# Patient Record
Sex: Female | Born: 1955 | Race: White | Hispanic: No | Marital: Single | Smoking: Never smoker
Health system: Southern US, Community
[De-identification: ages and names within clinical notes are randomized; demographics above are authoritative.]

## PROBLEM LIST (undated history)

## (undated) DIAGNOSIS — K469 Unspecified abdominal hernia without obstruction or gangrene: Secondary | ICD-10-CM

## (undated) DIAGNOSIS — G629 Polyneuropathy, unspecified: Secondary | ICD-10-CM

## (undated) DIAGNOSIS — I209 Angina pectoris, unspecified: Secondary | ICD-10-CM

## (undated) DIAGNOSIS — F329 Major depressive disorder, single episode, unspecified: Secondary | ICD-10-CM

## (undated) DIAGNOSIS — I509 Heart failure, unspecified: Secondary | ICD-10-CM

## (undated) DIAGNOSIS — M109 Gout, unspecified: Secondary | ICD-10-CM

## (undated) DIAGNOSIS — M797 Fibromyalgia: Secondary | ICD-10-CM

## (undated) DIAGNOSIS — K635 Polyp of colon: Secondary | ICD-10-CM

## (undated) DIAGNOSIS — D649 Anemia, unspecified: Secondary | ICD-10-CM

## (undated) DIAGNOSIS — F32A Depression, unspecified: Secondary | ICD-10-CM

## (undated) DIAGNOSIS — I10 Essential (primary) hypertension: Secondary | ICD-10-CM

## (undated) DIAGNOSIS — K7460 Unspecified cirrhosis of liver: Secondary | ICD-10-CM

## (undated) DIAGNOSIS — E119 Type 2 diabetes mellitus without complications: Secondary | ICD-10-CM

## (undated) DIAGNOSIS — K589 Irritable bowel syndrome without diarrhea: Secondary | ICD-10-CM

## (undated) DIAGNOSIS — M5030 Other cervical disc degeneration, unspecified cervical region: Secondary | ICD-10-CM

## (undated) DIAGNOSIS — E785 Hyperlipidemia, unspecified: Secondary | ICD-10-CM

## (undated) DIAGNOSIS — T7840XA Allergy, unspecified, initial encounter: Secondary | ICD-10-CM

## (undated) HISTORY — DX: Polyneuropathy, unspecified: G62.9

## (undated) HISTORY — DX: Fibromyalgia: M79.7

## (undated) HISTORY — DX: Unspecified cirrhosis of liver: K74.60

## (undated) HISTORY — PX: ESOPHAGOGASTRODUODENOSCOPY: SHX1529

## (undated) HISTORY — DX: Depression, unspecified: F32.A

## (undated) HISTORY — PX: CERVICAL DISC SURGERY: SHX588

## (undated) HISTORY — PX: UMBILICAL HERNIA REPAIR: SHX196

## (undated) HISTORY — DX: Other cervical disc degeneration, unspecified cervical region: M50.30

## (undated) HISTORY — DX: Gout, unspecified: M10.9

## (undated) HISTORY — PX: OTHER SURGICAL HISTORY: SHX169

## (undated) HISTORY — DX: Allergy, unspecified, initial encounter: T78.40XA

## (undated) HISTORY — DX: Major depressive disorder, single episode, unspecified: F32.9

## (undated) HISTORY — DX: Anemia, unspecified: D64.9

## (undated) HISTORY — PX: KNEE CARTILAGE SURGERY: SHX688

## (undated) HISTORY — DX: Irritable bowel syndrome, unspecified: K58.9

## (undated) HISTORY — PX: SPINE SURGERY: SHX786

---

## 1985-04-01 HISTORY — PX: TONSILLECTOMY: SUR1361

## 2002-05-25 ENCOUNTER — Encounter: Payer: Self-pay | Admitting: Neurosurgery

## 2002-05-31 ENCOUNTER — Encounter: Payer: Self-pay | Admitting: Neurosurgery

## 2002-05-31 ENCOUNTER — Ambulatory Visit (HOSPITAL_COMMUNITY): Admission: RE | Admit: 2002-05-31 | Discharge: 2002-06-01 | Payer: Self-pay | Admitting: Neurosurgery

## 2006-09-05 ENCOUNTER — Ambulatory Visit: Payer: Self-pay | Admitting: *Deleted

## 2006-12-10 ENCOUNTER — Ambulatory Visit: Payer: Self-pay | Admitting: *Deleted

## 2007-02-03 ENCOUNTER — Ambulatory Visit: Payer: Self-pay | Admitting: Gastroenterology

## 2009-03-01 ENCOUNTER — Ambulatory Visit: Payer: Self-pay | Admitting: Family Medicine

## 2010-03-22 ENCOUNTER — Ambulatory Visit: Payer: Self-pay | Admitting: Internal Medicine

## 2010-03-24 ENCOUNTER — Emergency Department: Payer: Self-pay | Admitting: Emergency Medicine

## 2010-04-10 ENCOUNTER — Emergency Department: Payer: Self-pay | Admitting: Emergency Medicine

## 2011-05-25 ENCOUNTER — Inpatient Hospital Stay: Payer: Self-pay | Admitting: Psychiatry

## 2011-05-25 LAB — COMPREHENSIVE METABOLIC PANEL
Alkaline Phosphatase: 155 U/L — ABNORMAL HIGH (ref 50–136)
Anion Gap: 8 (ref 7–16)
BUN: 13 mg/dL (ref 7–18)
Calcium, Total: 9.4 mg/dL (ref 8.5–10.1)
Chloride: 95 mmol/L — ABNORMAL LOW (ref 98–107)
Co2: 30 mmol/L (ref 21–32)
EGFR (African American): >60
EGFR (Non-African Amer.): >60
Osmolality: 268 (ref 275–301)
Potassium: 4 mmol/L (ref 3.5–5.1)
SGOT(AST): 22 U/L (ref 15–37)
Sodium: 133 mmol/L — ABNORMAL LOW (ref 136–145)

## 2011-05-25 LAB — ETHANOL
Ethanol %: <0.003 % (ref 0.000–0.080)
Ethanol: <3 mg/dL

## 2011-05-25 LAB — DRUG SCREEN, URINE
Amphetamines, Ur Screen: NEGATIVE (ref ?–1000)
Benzodiazepine, Ur Scrn: NEGATIVE (ref ?–200)
Cannabinoid 50 Ng, Ur ~~LOC~~: NEGATIVE (ref ?–50)
MDMA (Ecstasy)Ur Screen: NEGATIVE (ref ?–500)
Methadone, Ur Screen: NEGATIVE (ref ?–300)
Phencyclidine (PCP) Ur S: NEGATIVE (ref ?–25)

## 2011-05-25 LAB — CBC
HCT: 34.5 % — ABNORMAL LOW (ref 35.0–47.0)
MCH: 31.3 pg (ref 26.0–34.0)
MCHC: 33.1 g/dL (ref 32.0–36.0)
Platelet: 177 10*3/uL (ref 150–440)
RDW: 13.4 % (ref 11.5–14.5)
WBC: 8.1 10*3/uL (ref 3.6–11.0)

## 2011-05-25 LAB — T4, FREE: Free Thyroxine: 1.21 ng/dL (ref 0.76–1.46)

## 2011-05-25 LAB — ACETAMINOPHEN LEVEL: Acetaminophen: <2 ug/mL

## 2011-05-26 LAB — BEHAVIORAL MEDICINE 1 PANEL
BUN: 10 mg/dL (ref 7–18)
Basophil #: 0 10*3/uL (ref 0.0–0.1)
Basophil %: 0.7 %
Bilirubin,Total: 0.7 mg/dL (ref 0.2–1.0)
Calcium, Total: 9.1 mg/dL (ref 8.5–10.1)
Co2: 29 mmol/L (ref 21–32)
EGFR (African American): >60
EGFR (Non-African Amer.): >60
Eosinophil #: 0.1 10*3/uL (ref 0.0–0.7)
Eosinophil %: 2.1 %
Glucose: 105 mg/dL — ABNORMAL HIGH (ref 65–99)
Lymphocyte #: 1.5 10*3/uL (ref 1.0–3.6)
MCH: 31.7 pg (ref 26.0–34.0)
MCV: 93 fL (ref 80–100)
Monocyte #: 0.5 10*3/uL (ref 0.0–0.7)
Neutrophil %: 66.9 %
Osmolality: 279 (ref 275–301)
Platelet: 165 10*3/uL (ref 150–440)
Potassium: 3.5 mmol/L (ref 3.5–5.1)
RBC: 3.28 10*6/uL — ABNORMAL LOW (ref 3.80–5.20)
RDW: 13.3 % (ref 11.5–14.5)
SGOT(AST): 19 U/L (ref 15–37)
SGPT (ALT): 12 U/L
Thyroid Stimulating Horm: 4.07 u[IU]/mL

## 2011-05-27 DIAGNOSIS — Z79899 Other long term (current) drug therapy: Secondary | ICD-10-CM

## 2011-05-29 LAB — URINALYSIS, COMPLETE
Blood: NEGATIVE
Glucose,UR: NEGATIVE mg/dL (ref 0–75)
Nitrite: POSITIVE
Protein: NEGATIVE
RBC,UR: 1 /HPF (ref 0–5)
Squamous Epithelial: 1

## 2011-06-22 LAB — CBC
HCT: 30.5 % — ABNORMAL LOW (ref 35.0–47.0)
HGB: 10.4 g/dL — ABNORMAL LOW (ref 12.0–16.0)
MCV: 93 fL (ref 80–100)
Platelet: 162 10*3/uL (ref 150–440)
RBC: 3.27 10*6/uL — ABNORMAL LOW (ref 3.80–5.20)

## 2011-06-22 LAB — COMPREHENSIVE METABOLIC PANEL
Albumin: 3.9 g/dL (ref 3.4–5.0)
Alkaline Phosphatase: 133 U/L (ref 50–136)
Anion Gap: 16 (ref 7–16)
BUN: 8 mg/dL (ref 7–18)
Bilirubin,Total: 0.7 mg/dL (ref 0.2–1.0)
Calcium, Total: 8.7 mg/dL (ref 8.5–10.1)
Creatinine: 0.87 mg/dL (ref 0.60–1.30)
Glucose: 132 mg/dL — ABNORMAL HIGH (ref 65–99)
Osmolality: 272 (ref 275–301)
Potassium: 3 mmol/L — ABNORMAL LOW (ref 3.5–5.1)
SGPT (ALT): 19 U/L
Sodium: 136 mmol/L (ref 136–145)
Total Protein: 7.7 g/dL (ref 6.4–8.2)

## 2011-06-22 LAB — TSH: Thyroid Stimulating Horm: 4.29 u[IU]/mL

## 2011-06-22 LAB — URINALYSIS, COMPLETE
Bilirubin,UR: NEGATIVE
Nitrite: POSITIVE
Ph: 6 (ref 4.5–8.0)
Protein: NEGATIVE
RBC,UR: NONE SEEN /HPF (ref 0–5)
Squamous Epithelial: 31
WBC UR: 14 /HPF (ref 0–5)

## 2011-06-22 LAB — DRUG SCREEN, URINE
Amphetamines, Ur Screen: NEGATIVE (ref ?–1000)
Cannabinoid 50 Ng, Ur ~~LOC~~: NEGATIVE (ref ?–50)
Cocaine Metabolite,Ur ~~LOC~~: NEGATIVE (ref ?–300)
MDMA (Ecstasy)Ur Screen: NEGATIVE (ref ?–500)
Methadone, Ur Screen: NEGATIVE (ref ?–300)
Opiate, Ur Screen: NEGATIVE (ref ?–300)
Phencyclidine (PCP) Ur S: NEGATIVE (ref ?–25)
Tricyclic, Ur Screen: POSITIVE (ref ?–1000)

## 2011-06-22 LAB — ACETAMINOPHEN LEVEL: Acetaminophen: <2 ug/mL

## 2011-06-23 ENCOUNTER — Inpatient Hospital Stay: Payer: Self-pay | Admitting: Psychiatry

## 2013-03-10 ENCOUNTER — Ambulatory Visit: Payer: Self-pay | Admitting: Family Medicine

## 2013-03-17 ENCOUNTER — Encounter: Payer: Self-pay | Admitting: Podiatry

## 2013-03-17 ENCOUNTER — Ambulatory Visit (INDEPENDENT_AMBULATORY_CARE_PROVIDER_SITE_OTHER): Payer: Medicare Other | Admitting: Podiatry

## 2013-03-17 ENCOUNTER — Ambulatory Visit (INDEPENDENT_AMBULATORY_CARE_PROVIDER_SITE_OTHER): Payer: Medicare Other

## 2013-03-17 ENCOUNTER — Ambulatory Visit: Payer: Self-pay | Admitting: Podiatry

## 2013-03-17 VITALS — BP 110/60 | HR 79 | Resp 22 | Ht 65.0 in | Wt 194.0 lb

## 2013-03-17 DIAGNOSIS — L97509 Non-pressure chronic ulcer of other part of unspecified foot with unspecified severity: Secondary | ICD-10-CM

## 2013-03-17 DIAGNOSIS — M79609 Pain in unspecified limb: Secondary | ICD-10-CM

## 2013-03-17 DIAGNOSIS — M79672 Pain in left foot: Secondary | ICD-10-CM

## 2013-03-17 DIAGNOSIS — E1169 Type 2 diabetes mellitus with other specified complication: Secondary | ICD-10-CM

## 2013-03-17 DIAGNOSIS — L97512 Non-pressure chronic ulcer of other part of right foot with fat layer exposed: Secondary | ICD-10-CM

## 2013-03-17 MED ORDER — MUPIROCIN 2 % EX OINT: 1.0000 "application " | TOPICAL_OINTMENT | Freq: Two times a day (BID) | CUTANEOUS | Status: DC

## 2013-03-17 MED ORDER — CLINDAMYCIN HCL 150 MG PO CAPS: 150.0000 mg | ORAL_CAPSULE | Freq: Two times a day (BID) | ORAL | Status: DC

## 2013-03-17 MED ORDER — AMOXICILLIN-POT CLAVULANATE 875-125 MG PO TABS: 1.0000 | ORAL_TABLET | Freq: Two times a day (BID) | ORAL | Status: DC

## 2013-03-17 NOTE — Patient Instructions (Signed)
Epsom salt soaks twice daily, 1 cup epsom salt to a quart of warm water

## 2013-03-17 NOTE — Progress Notes (Signed)
   Subjective:    Patient ID: [name], female    DOB: 1956-02-23, 57 y.o.   MRN: [id number]  HPI Comments: I HAVE AN INFECTION IN MY RIGHT 3RD TOE AND ITS NOT GETTING ANY BETTER   N RED SWOLLEN , ULCER , DIABETIC 10 YEARS  L RIGHT # 3 TOE D 1 MONTH  O THINK CUTS IT AND LOSS TOENAIL C WORSE  A  T [facility] , ANTIBIOTICS   Diabetes Associated symptoms include fatigue.      Review of Systems  Constitutional: Positive for appetite change and fatigue.       WEIGHT CHANGE   Gastrointestinal:       ABDOMINAL PAIN  NAUSEA AND VOMITING  Endocrine:       DIABETIC TYPE 2   All other systems reviewed and are negative.       Objective:   Physical Exam: [name] presents today 57 year old white female painful third toe right foot has been treated with Bactrim to no avail. I have reviewed her past medical history medications allergies surgeries social history and review of systems. Vital signs are stable she is alert and oriented x3. Pulses are palpable +2/4 DP and PT bilateral, capillary fill time to digits one through 5 is immediate. Neurologic sensorium is decreased per Semmes-Weinstein monofilament to the level of the toes only. Deep tendon reflexes are intact and brisk bilateral. Orthopedic evaluation Mr. is all joints distal to the ankle have a full range of motion without crepitus mild HAV deformity and hammertoe deformities are noted bilateral. Cutaneous evaluation Mr. is supple while hydrated cutis with erythema and edema to the third digit of the right foot with a distal ulceration to the third toe resulting in a granuloma the does not probe deep to bone. Radiographic evaluation doesn't demonstrate any type of osseous abnormality to the tip of the toe however it does demonstrates severe osteoarthritis about the entire foot.       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  Plan:  Debridement wound thoroughly today placed dry sterile compressive dressing. She was given both oral and written home-going instructions on how to perform this similarly after soaking twice daily in Epsom salts in warm water. She was given a prescription for Augmentin 875 one by mouth twice a day as well as clindamycin 150 one by mouth twice a day she was dispensed a Darco shoe and I will followup with her in 2 weeks

## 2013-03-31 ENCOUNTER — Ambulatory Visit: Payer: Medicare Other | Admitting: Podiatry

## 2013-03-31 VITALS — BP 107/59 | HR 70 | Resp 18

## 2013-03-31 DIAGNOSIS — L97509 Non-pressure chronic ulcer of other part of unspecified foot with unspecified severity: Secondary | ICD-10-CM

## 2013-03-31 DIAGNOSIS — E1169 Type 2 diabetes mellitus with other specified complication: Secondary | ICD-10-CM

## 2013-03-31 NOTE — Progress Notes (Signed)
She presents today for followup of her cellulitis and hammertoe for deformity with ulcer to the third digit of her right foot. She states it seems to be decreasing as she takes her antibiotics and redness and pain. She states that I think is looking better.  Objective: Vital signs are stable she is alert and oriented x3. Pulses are strongly palpable right. Hammertoe deformity is resulting in a distal clavus the broke down and is now ulcerated. At this point mild cellulitis is noted no drainage. As noted. I debrided all reactive hyperkeratosis to bleeding today with no signs of infection prominently.  Assessment: Diabetic ulceration distal aspect third digit right foot care rule out osteomyelitis at this point.  Plan: Discussed etiology pathology conservative versus surgical therapies. She will continue her antibiotic that she does have refilled I debrided the wound thoroughly today redressed it. She will continue the use of her Darco shoe. I also prescribed her a buttress pad to help hold the toe from the floor. I will followup with her a couple weeks at which time x-rays will be taken to a firm no osteomyelitis

## 2013-04-01 HISTORY — PX: CHOLECYSTECTOMY: SHX55

## 2013-04-01 HISTORY — PX: HERNIA REPAIR: SHX51

## 2013-04-01 HISTORY — PX: TOE AMPUTATION: SHX809

## 2013-04-14 ENCOUNTER — Ambulatory Visit: Payer: Medicare Other | Admitting: Podiatry

## 2013-04-21 ENCOUNTER — Encounter: Payer: Self-pay | Admitting: Podiatry

## 2013-04-21 ENCOUNTER — Ambulatory Visit (INDEPENDENT_AMBULATORY_CARE_PROVIDER_SITE_OTHER): Payer: Medicare Other | Admitting: Podiatry

## 2013-04-21 VITALS — BP 109/63 | HR 71 | Resp 18

## 2013-04-21 DIAGNOSIS — M79609 Pain in unspecified limb: Secondary | ICD-10-CM

## 2013-04-21 DIAGNOSIS — B351 Tinea unguium: Secondary | ICD-10-CM

## 2013-04-21 DIAGNOSIS — L97509 Non-pressure chronic ulcer of other part of unspecified foot with unspecified severity: Secondary | ICD-10-CM

## 2013-04-21 NOTE — Progress Notes (Signed)
Ulcer check 3rd toe right foot, pt states that it is healing and would like her toenails trimmed that they are getting to long and painful. She continues to soak the toe in dressing daily. She states that she thinks is getting much better.  Objective: Vital signs are stable she is alert and oriented x3 pulses are palpable bilateral lower extremity. Nails are thick yellow dystrophic sharply incurvated and painful on palpation as well as debridement. Third digit of the right foot demonstrates hammertoe deformity with overlying erythema and edema with the distal ulceration.  Assessment: Ulceration third digit right foot. Pain in limb secondary to onychomycosis.  Plan: Debridement of nails 1 through 5 bilateral covered service second of pain and diabetes.

## 2013-04-23 ENCOUNTER — Ambulatory Visit: Payer: Self-pay | Admitting: Surgery

## 2013-05-11 ENCOUNTER — Ambulatory Visit: Payer: Self-pay | Admitting: Gastroenterology

## 2013-05-19 ENCOUNTER — Ambulatory Visit: Payer: Medicare Other | Admitting: Podiatry

## 2013-05-20 ENCOUNTER — Ambulatory Visit: Payer: Self-pay | Admitting: Gastroenterology

## 2013-05-20 LAB — LACTATE DEHYDROGENASE, PLEURAL OR PERITONEAL FLUID: LDH, BODY FLUID: 90 U/L

## 2013-05-20 LAB — AMYLASE, BODY FLUID: AMYLASE, BODY FLUID: 15 U/L

## 2013-05-20 LAB — BODY FLUID CELL COUNT WITH DIFFERENTIAL
Basophil: 0 %
Eosinophil: 0 %
LYMPHS PCT: 62 %
Neutrophils: 5 %
Nucleated Cell Count: 185 /mm3
OTHER MONONUCLEAR CELLS: 33 %
Other Cells BF: 0 %

## 2013-05-20 LAB — PROTEIN, BODY FLUID: Protein, Body Fluid: 3.5 g/dL

## 2013-05-20 LAB — GLUCOSE, SEROUS FLUID: GLUCOSE, BODY FLUID: 72 mg/dL

## 2013-05-21 LAB — ALBUMIN, FLUID (OTHER): Body Fluid Albumin: 2 g/dL

## 2013-05-24 LAB — BODY FLUID CULTURE

## 2013-05-26 ENCOUNTER — Ambulatory Visit: Payer: Medicare Other | Admitting: Podiatry

## 2013-05-26 ENCOUNTER — Emergency Department: Payer: Self-pay | Admitting: Emergency Medicine

## 2013-05-26 LAB — CBC WITH DIFFERENTIAL/PLATELET
BASOS ABS: 0 10*3/uL (ref 0.0–0.1)
Basophil %: 0.4 %
EOS ABS: 0 10*3/uL (ref 0.0–0.7)
EOS PCT: 0.2 %
HCT: 35 % (ref 35.0–47.0)
HGB: 11.7 g/dL — ABNORMAL LOW (ref 12.0–16.0)
LYMPHS ABS: 0.1 10*3/uL — AB (ref 1.0–3.6)
LYMPHS PCT: 2.2 %
MCH: 31.5 pg (ref 26.0–34.0)
MCHC: 33.5 g/dL (ref 32.0–36.0)
MCV: 94 fL (ref 80–100)
MONOS PCT: 2.8 %
Monocyte #: 0.2 x10 3/mm (ref 0.2–0.9)
NEUTROS ABS: 5.6 10*3/uL (ref 1.4–6.5)
Neutrophil %: 94.4 %
PLATELETS: 253 10*3/uL (ref 150–440)
RBC: 3.72 10*6/uL — ABNORMAL LOW (ref 3.80–5.20)
RDW: 13.1 % (ref 11.5–14.5)
WBC: 5.9 10*3/uL (ref 3.6–11.0)

## 2013-05-26 LAB — URINALYSIS, COMPLETE
Blood: NEGATIVE
GLUCOSE, UR: NEGATIVE mg/dL (ref 0–75)
Leukocyte Esterase: NEGATIVE
Nitrite: POSITIVE
PH: 5 (ref 4.5–8.0)
RBC,UR: <1 /HPF (ref 0–5)
SPECIFIC GRAVITY: 1.024 (ref 1.003–1.030)
SQUAMOUS EPITHELIAL: NONE SEEN

## 2013-05-26 LAB — COMPREHENSIVE METABOLIC PANEL
Albumin: 3.8 g/dL (ref 3.4–5.0)
Alkaline Phosphatase: 209 U/L — ABNORMAL HIGH
Anion Gap: 4 — ABNORMAL LOW (ref 7–16)
BUN: 4 mg/dL — AB (ref 7–18)
Bilirubin,Total: 0.8 mg/dL (ref 0.2–1.0)
CALCIUM: 9.1 mg/dL (ref 8.5–10.1)
CO2: 28 mmol/L (ref 21–32)
CREATININE: 0.6 mg/dL (ref 0.60–1.30)
Chloride: 105 mmol/L (ref 98–107)
EGFR (Non-African Amer.): >60
Glucose: 108 mg/dL — ABNORMAL HIGH (ref 65–99)
Osmolality: 271 (ref 275–301)
POTASSIUM: 3.6 mmol/L (ref 3.5–5.1)
SGOT(AST): 28 U/L (ref 15–37)
SGPT (ALT): 18 U/L (ref 12–78)
Sodium: 137 mmol/L (ref 136–145)
Total Protein: 7.8 g/dL (ref 6.4–8.2)

## 2013-05-26 LAB — AMMONIA: Ammonia, Plasma: 47 mcmol/L — ABNORMAL HIGH (ref 11–32)

## 2013-05-26 LAB — TROPONIN I

## 2013-06-07 ENCOUNTER — Ambulatory Visit: Payer: Medicare Other | Admitting: Podiatry

## 2013-06-10 ENCOUNTER — Ambulatory Visit: Payer: Self-pay | Admitting: Gastroenterology

## 2013-06-10 HISTORY — PX: COLONOSCOPY W/ BIOPSIES AND POLYPECTOMY: SHX1376

## 2013-06-21 ENCOUNTER — Encounter: Payer: Self-pay | Admitting: Podiatry

## 2013-06-21 ENCOUNTER — Ambulatory Visit (INDEPENDENT_AMBULATORY_CARE_PROVIDER_SITE_OTHER): Payer: Medicare Other | Admitting: Podiatry

## 2013-06-21 VITALS — BP 105/63 | HR 68 | Resp 16

## 2013-06-21 DIAGNOSIS — B351 Tinea unguium: Secondary | ICD-10-CM

## 2013-06-21 DIAGNOSIS — M79609 Pain in unspecified limb: Secondary | ICD-10-CM

## 2013-06-21 DIAGNOSIS — M86679 Other chronic osteomyelitis, unspecified ankle and foot: Secondary | ICD-10-CM

## 2013-06-21 DIAGNOSIS — E119 Type 2 diabetes mellitus without complications: Secondary | ICD-10-CM

## 2013-06-21 NOTE — Progress Notes (Signed)
She presents today stating that her third toe of her right foot looks worse than her she says I am unable to take care of it on a regular basis. She denies fever chills nausea and vomiting. The toe is becoming more painful. Objective: Vital signs are stable she is alert and oriented x3 pulses are palpable right lower extremity third digit of the right foot demonstrates erythema and edema with abscess and chronic ulceration that is probable to the plantar distal aspect of the third toe right foot.  Assessment: Osteomyelitis third digit right foot.  Plan: We went over the consent form today line bylined number by number giving her ample time to ask questions she saw fit regarding a distal disarticulation with resection of a portion of the toe and osteomyelitis. Answered all the questions regarding the procedure the best [name] terms she understands that is amenable to it. He signed all 3 pages of the consent form and I will followup with her in the near future for surgery we are asking for 2 clearances from her primary doctors and we are awaiting does before scheduling this case.

## 2013-07-12 ENCOUNTER — Telehealth: Payer: Self-pay | Admitting: *Deleted

## 2013-07-12 NOTE — Telephone Encounter (Signed)
Tried to call patient to tell her that we got the clearance from her doctors. We need to go ahead and get her a date for surgery. Could not leave message no answering machine

## 2013-07-19 ENCOUNTER — Ambulatory Visit: Payer: Medicare Other | Admitting: Podiatry

## 2013-07-21 ENCOUNTER — Ambulatory Visit (INDEPENDENT_AMBULATORY_CARE_PROVIDER_SITE_OTHER): Payer: Medicare Other | Admitting: Podiatry

## 2013-07-21 VITALS — Resp 16 | Ht 65.0 in | Wt 190.0 lb

## 2013-07-21 DIAGNOSIS — E119 Type 2 diabetes mellitus without complications: Secondary | ICD-10-CM

## 2013-07-21 DIAGNOSIS — M86679 Other chronic osteomyelitis, unspecified ankle and foot: Secondary | ICD-10-CM

## 2013-07-21 MED ORDER — OXYCODONE-ACETAMINOPHEN 10-325 MG PO TABS: ORAL_TABLET | ORAL | Status: DC

## 2013-07-21 MED ORDER — PROMETHAZINE HCL 12.5 MG PO TABS: ORAL_TABLET | ORAL | Status: DC

## 2013-07-21 MED ORDER — AMOXICILLIN-POT CLAVULANATE 875-125 MG PO TABS: 1.0000 | ORAL_TABLET | Freq: Two times a day (BID) | ORAL | Status: DC

## 2013-07-21 NOTE — Progress Notes (Signed)
She presents today for routine nail debridement and also concerned about her osteomyelitis to her third digit of her right foot. We have just recently received her clearance from her doctors so we can schedule her amputation third digit right foot.  Objective: Pulses are palpable bilateral. Cellulitis third digit of the right foot with open ulceration probable osteomyelitis. Nails are thick yellow dystrophic clinically mycotic and painful elongated. Hammertoe deformities are noted bilateral.  Assessment: Pain in limb secondary to onychomycosis. Osteomyelitis third digit right foot. Cellulitis third digit right foot.  Plan: Scheduled her for amputation third digit of the right foot. She was prescribed Augmentin 875 mg as well as her postop pain medication and antibiotics. I will followup with her in 3 days for amputation.

## 2013-07-22 ENCOUNTER — Telehealth: Payer: Self-pay | Admitting: Podiatry

## 2013-07-22 NOTE — Telephone Encounter (Signed)
PT CONCERNED THAT SOME OF THE MEDS SHE IS ON IS ON THE SURGERY NO NO LIST. PT WOULD LIKE TO TALK TO YOU BOTH 3PM.

## 2013-07-22 NOTE — Telephone Encounter (Signed)
Tried calling pt. Received no answer.

## 2013-07-23 ENCOUNTER — Encounter: Payer: Self-pay | Admitting: Podiatry

## 2013-07-23 DIAGNOSIS — M86679 Other chronic osteomyelitis, unspecified ankle and foot: Secondary | ICD-10-CM

## 2013-07-27 ENCOUNTER — Telehealth: Payer: Self-pay | Admitting: *Deleted

## 2013-07-27 NOTE — Progress Notes (Signed)
Amputation (partial/whole) 3rd toe right foot

## 2013-07-27 NOTE — Telephone Encounter (Signed)
Called to ask pt how she is doing from surgery 4.24.15 and received no answer.

## 2013-07-29 ENCOUNTER — Encounter: Payer: Medicare Other | Admitting: Podiatry

## 2013-07-30 ENCOUNTER — Encounter: Payer: Medicare Other | Admitting: Podiatry

## 2013-08-02 ENCOUNTER — Ambulatory Visit (INDEPENDENT_AMBULATORY_CARE_PROVIDER_SITE_OTHER): Payer: Medicare Other | Admitting: Podiatry

## 2013-08-02 ENCOUNTER — Encounter: Payer: Self-pay | Admitting: Podiatry

## 2013-08-02 ENCOUNTER — Ambulatory Visit: Payer: Medicare Other

## 2013-08-02 ENCOUNTER — Ambulatory Visit (INDEPENDENT_AMBULATORY_CARE_PROVIDER_SITE_OTHER): Payer: Medicare Other

## 2013-08-02 VITALS — BP 112/64 | HR 70 | Temp 96.7°F | Resp 16

## 2013-08-02 DIAGNOSIS — Z9889 Other specified postprocedural states: Secondary | ICD-10-CM

## 2013-08-02 NOTE — Progress Notes (Signed)
She presents today for her first postop visit. She denies fever chills nausea vomiting muscle aches and pains. Date of surgery was April 24.  Objective: Vital signs are stable she is alert and oriented x3. Dry sterile dressing was intact to the right foot was removed reveals minimal edema no erythema cellulitis drainage or odor. Disarticulation of the third digit right foot at the PIPJ demonstrates a well healing surgical toe. Appears to be intact without complications. Radiographs confirm complete disarticulation with no signs of bone infection.  Assessment: Well-healing surgical toe third right.  Plan: Redressed today dressed a compressive dressing followup with her in one week for sutures out

## 2013-08-09 ENCOUNTER — Encounter: Payer: Self-pay | Admitting: Podiatry

## 2013-08-09 ENCOUNTER — Ambulatory Visit (INDEPENDENT_AMBULATORY_CARE_PROVIDER_SITE_OTHER): Payer: Medicare Other | Admitting: Podiatry

## 2013-08-09 VITALS — BP 103/62 | HR 70 | Resp 16

## 2013-08-09 DIAGNOSIS — E119 Type 2 diabetes mellitus without complications: Secondary | ICD-10-CM

## 2013-08-09 DIAGNOSIS — Z9889 Other specified postprocedural states: Secondary | ICD-10-CM

## 2013-08-09 NOTE — Progress Notes (Signed)
She presents today for a followup of amputation third digit of the right foot. She denies fever chills nausea vomits weeks and pains. All the sutures were removed from the toe today. Be healing quite nicely. Steri-Strips were placed.  Assessment well-healing surgical toe third right.  Plan: Will allow her to start getting this wet in the shower then she is to soak in Epsom salts in warm water. I will followup with her in one week.

## 2013-08-16 ENCOUNTER — Ambulatory Visit: Payer: Medicare Other | Admitting: Podiatry

## 2013-08-16 VITALS — BP 110/62 | HR 77 | Resp 16

## 2013-08-16 DIAGNOSIS — M2040 Other hammer toe(s) (acquired), unspecified foot: Secondary | ICD-10-CM

## 2013-08-16 DIAGNOSIS — Z9889 Other specified postprocedural states: Secondary | ICD-10-CM

## 2013-08-16 DIAGNOSIS — M79609 Pain in unspecified limb: Secondary | ICD-10-CM

## 2013-08-16 DIAGNOSIS — E119 Type 2 diabetes mellitus without complications: Secondary | ICD-10-CM

## 2013-08-16 NOTE — Progress Notes (Signed)
She presents today for followup of dictation third digit right foot secondary to osteomyelitis. This is gone to heal completely she states. I meant concerned about third toe and the left foot.  Objective: Vital signs are stable she is alert and oriented x3. There is no erythema edema cellulitis drainage or odor to surgical toe right. Left foot does demonstrate a distal clavus with a superficial ulceration which is debrided today. We dispensed crest pads and will followup with her in approximately one month for routine nail debridement.  Assessment: Well-healing surgical foot right. Hammertoe deformity with distal clavus third digit left.  Plan: Debridement of all reactive hyperkeratosis dispensed crest pads and I will followup with her in one month

## 2013-08-24 ENCOUNTER — Encounter: Payer: Self-pay | Admitting: Podiatry

## 2013-09-02 ENCOUNTER — Ambulatory Visit: Payer: Self-pay | Admitting: Gastroenterology

## 2013-09-06 ENCOUNTER — Encounter: Payer: Self-pay | Admitting: *Deleted

## 2013-09-09 ENCOUNTER — Ambulatory Visit: Payer: Self-pay | Admitting: Surgery

## 2013-09-09 LAB — APTT: ACTIVATED PTT: 33.2 s (ref 23.6–35.9)

## 2013-09-09 LAB — PROTIME-INR
INR: 1.2
PROTHROMBIN TIME: 15.2 s — AB (ref 11.5–14.7)

## 2013-09-20 ENCOUNTER — Ambulatory Visit: Payer: Medicare Other | Admitting: General Surgery

## 2013-09-21 ENCOUNTER — Ambulatory Visit: Payer: Self-pay | Admitting: Surgery

## 2013-09-21 LAB — PROTIME-INR
INR: 1.2
PROTHROMBIN TIME: 14.7 s (ref 11.5–14.7)

## 2013-09-22 LAB — PATHOLOGY REPORT

## 2013-10-05 ENCOUNTER — Encounter: Payer: Self-pay | Admitting: General Surgery

## 2013-10-05 ENCOUNTER — Ambulatory Visit: Payer: Medicare Other | Admitting: General Surgery

## 2013-10-05 VITALS — BP 132/78 | HR 72 | Resp 14 | Ht 65.0 in | Wt 199.0 lb

## 2013-10-05 DIAGNOSIS — T8140XA Infection following a procedure, unspecified, initial encounter: Secondary | ICD-10-CM

## 2013-10-05 NOTE — Progress Notes (Signed)
Patient ID: [name], female   DOB: September 25, 1955, 58 y.o.   MRN: [id number]  Chief Complaint  Patient presents with  . Other    gallbladder surgery    HPI [name] is a 58 y.o. female here today for her post op gallbladder and umbilical hernia repair surgery done on  09/21/13 By. Dr. Tamala Julian. The patient states she went to see Ebony Cargo to follow up for fluid in her stomach and she feels that her incision is infected. The patient denies any pain, nausea, vomiting, fever or chills.   HPI  History reviewed. No pertinent past medical history.  Past Surgical History  Procedure Laterality Date  . Cesarean section  [id number]  . Spine surgery    . Tonsillectomy  1987  . Toe amputation Right 2015    partial amputation 3rd toe  . Hernia repair  2015  . Cholecystectomy  2015    History reviewed. No pertinent family history.  Social History History  Substance Use Topics  . Smoking status: Never Smoker   . Smokeless tobacco: Never Used  . Alcohol Use: No    Allergies  Allergen Reactions  . Vicodin [Hydrocodone-Acetaminophen] Other (See Comments)    Per patient she crawls all over the bed    Current Outpatient Prescriptions  Medication Sig Dispense Refill  . acetaminophen (TYLENOL) 500 MG tablet Take 500 mg by mouth every 6 (six) hours as needed.      Marland Kitchen buPROPion (WELLBUTRIN XL) 300 MG 24 hr tablet Take 300 mg by mouth daily.      . Cholecalciferol (VITAMIN D3) 1000 UNITS CAPS Take by mouth.      . citalopram (CELEXA) 40 MG tablet Take 40 mg by mouth daily.      . furosemide (LASIX) 20 MG tablet       . lactulose (CHRONULAC) 10 GM/15ML solution       . meloxicam (MOBIC) 15 MG tablet Take 15 mg by mouth daily.      . metFORMIN (GLUMETZA) 500 MG (MOD) 24 hr tablet Take 500 mg by mouth 2 (two) times daily with a meal.      . methocarbamol (ROBAXIN) 750 MG tablet Take 750 mg by mouth 3 (three) times daily.      . polyethylene glycol powder (GLYCOLAX/MIRALAX) powder        . pravastatin (PRAVACHOL) 20 MG tablet Take 20 mg by mouth daily.      . risperiDONE (RISPERDAL) 2 MG tablet Take 2 mg by mouth 2 (two) times daily.      Marland Kitchen spironolactone (ALDACTONE) 25 MG tablet       . XIFAXAN 550 MG TABS tablet        No current facility-administered medications for this visit.    Review of Systems Review of Systems  Constitutional: Negative.   Respiratory: Negative.   Cardiovascular: Negative.   Gastrointestinal: Negative.     Blood pressure 132/78, pulse 72, resp. rate 14, height 5' 5"  (1.651 m), weight 199 lb (90.266 kg).  Physical Exam Physical Exam  Constitutional: She is oriented to person, place, and time. She appears well-developed and well-nourished.  Abdominal: Soft. Normal appearance and bowel sounds are normal.  Neurological: She is alert and oriented to person, place, and time.  Skin: Skin is warm and dry.  Abdominal incision near the umbilicus has mild irritation- what looks like a cautery burn on one edge. No sign of infection.   Data Reviewed None  Assessment    No  need for oral antibiotics. Patient advised on keeping the area clean. May use antibiotic ointment if she chooses.     Plan    Follow up as needed.        [name] G 10/06/2013, 6:20 AM

## 2013-10-05 NOTE — Patient Instructions (Addendum)
Patient advised to keep area clean and can use Neosporin in the area with a bandaid. Patient to follow up as needed.

## 2013-10-06 ENCOUNTER — Encounter: Payer: Self-pay | Admitting: General Surgery

## 2013-10-06 ENCOUNTER — Ambulatory Visit: Payer: Self-pay | Admitting: Gastroenterology

## 2013-10-18 ENCOUNTER — Ambulatory Visit (INDEPENDENT_AMBULATORY_CARE_PROVIDER_SITE_OTHER): Payer: Medicare Other

## 2013-10-18 ENCOUNTER — Ambulatory Visit (INDEPENDENT_AMBULATORY_CARE_PROVIDER_SITE_OTHER): Payer: Medicare Other | Admitting: Podiatry

## 2013-10-18 ENCOUNTER — Encounter: Payer: Self-pay | Admitting: Podiatry

## 2013-10-18 DIAGNOSIS — L97509 Non-pressure chronic ulcer of other part of unspecified foot with unspecified severity: Secondary | ICD-10-CM

## 2013-10-18 DIAGNOSIS — L97511 Non-pressure chronic ulcer of other part of right foot limited to breakdown of skin: Secondary | ICD-10-CM

## 2013-10-18 DIAGNOSIS — M79609 Pain in unspecified limb: Secondary | ICD-10-CM

## 2013-10-18 DIAGNOSIS — E119 Type 2 diabetes mellitus without complications: Secondary | ICD-10-CM

## 2013-10-18 DIAGNOSIS — B351 Tinea unguium: Secondary | ICD-10-CM

## 2013-10-18 DIAGNOSIS — M79674 Pain in right toe(s): Secondary | ICD-10-CM

## 2013-10-18 MED ORDER — AMOXICILLIN-POT CLAVULANATE 875-125 MG PO TABS: 1.0000 | ORAL_TABLET | Freq: Two times a day (BID) | ORAL | Status: DC

## 2013-10-18 NOTE — Progress Notes (Signed)
She presents today for a chief complaint of a painful lesion to the plantar aspect second digit of the right foot. We amputated the third digit of the right foot do to a painful ulceration those chronic it would not heal. Similarly her second toe is becoming painful.  Objective: Vital signs are stable she is alert and oriented x3. She has a ulceration with reactive hyperkeratosis and mild purulence, a very small amount, to the distal aspect of the toe. Her nails are thick yellow dystrophic onychomycotic and painful palpation.  Assessment: Diabetic ulceration hammertoe deformity second digit right foot. Pain in limb secondary to onychomycosis 1 through 5 bilateral.  Plan: Debridement of nails 1 through 5 bilateral. Debridement of cellulitic ulceration distal second digit right. Started her on Augmentin 875 mg 1 by mouth twice a day #20 with one refill. She will once again initiate her soaking regimen dressing regimen the use of the Darco shoe and Bactroban ointment. I will followup with her in 2 weeks

## 2013-10-27 ENCOUNTER — Ambulatory Visit: Payer: Medicare Other | Admitting: Podiatry

## 2013-11-01 ENCOUNTER — Ambulatory Visit (INDEPENDENT_AMBULATORY_CARE_PROVIDER_SITE_OTHER): Payer: Medicare Other | Admitting: Podiatry

## 2013-11-01 VITALS — BP 122/64 | HR 74 | Resp 16

## 2013-11-01 DIAGNOSIS — E119 Type 2 diabetes mellitus without complications: Secondary | ICD-10-CM

## 2013-11-01 DIAGNOSIS — L97511 Non-pressure chronic ulcer of other part of right foot limited to breakdown of skin: Secondary | ICD-10-CM

## 2013-11-01 DIAGNOSIS — L97509 Non-pressure chronic ulcer of other part of unspecified foot with unspecified severity: Secondary | ICD-10-CM

## 2013-11-01 MED ORDER — AMOXICILLIN-POT CLAVULANATE 875-125 MG PO TABS: 1.0000 | ORAL_TABLET | Freq: Two times a day (BID) | ORAL | Status: DC

## 2013-11-01 NOTE — Progress Notes (Signed)
She presents today for followup of her hammertoe deformity and ulceration with cellulitis right foot. She denies fever chills nausea vomiting muscle aches and pains. She states she continues to take her antibiotics.  Objective: Vital signs are stable she is alert and oriented x3. The toe is still mildly erythematous the level of the DIPJ but the ulceration once debrided the reactive hyperkeratotic tissue demonstrates minimal ulceration.  Assessment: Slowly resolving cellulitis with ulceration second digit right foot.  Plan: Continue antibiotics and refill dose today for her. She will continue dressing the toe on a regular basis after soaking in Betadine warm water. I will followup with her in 2 weeks.

## 2013-11-15 ENCOUNTER — Ambulatory Visit: Payer: Medicare Other | Admitting: Podiatry

## 2013-11-22 ENCOUNTER — Ambulatory Visit: Payer: Medicare Other | Admitting: Podiatry

## 2013-11-25 ENCOUNTER — Ambulatory Visit (INDEPENDENT_AMBULATORY_CARE_PROVIDER_SITE_OTHER): Payer: Medicare Other | Admitting: Podiatry

## 2013-11-25 ENCOUNTER — Encounter: Payer: Self-pay | Admitting: Podiatry

## 2013-11-25 VITALS — BP 123/66 | HR 78 | Resp 16 | Wt 200.0 lb

## 2013-11-25 DIAGNOSIS — L97509 Non-pressure chronic ulcer of other part of unspecified foot with unspecified severity: Secondary | ICD-10-CM

## 2013-11-25 DIAGNOSIS — L97511 Non-pressure chronic ulcer of other part of right foot limited to breakdown of skin: Secondary | ICD-10-CM

## 2013-11-25 MED ORDER — AMOXICILLIN-POT CLAVULANATE 875-125 MG PO TABS: 1.0000 | ORAL_TABLET | Freq: Two times a day (BID) | ORAL | Status: DC

## 2013-11-25 NOTE — Progress Notes (Signed)
She presents today for followup of her ulceration second digit of the right foot. She states that I thought it was getting better until the skin started to break down the top and she points to the PIPJ second digit right foot.  Objective: Vital signs are stable she is alert and oriented x3. There is mild erythema no edema no cellulitis drainage or odor just superficial skin breakdown overlying the PIPJ of the right foot there is erythema from the distal aspect of the toe to the level of the PIPJ but does not appear to be infectious in nature more of a irritation or a atopic dermatitis.  Assessment: Hammertoe deformity with ulceration distal skin breakdown PIPJ dorsally  Plan: Debridement of all reactive hyperkeratosis today and suggested that we continue the use of conservative therapies and I did start her back on her Augmentin just to cover her.

## 2013-12-07 ENCOUNTER — Other Ambulatory Visit: Payer: Self-pay | Admitting: Gastroenterology

## 2013-12-07 LAB — AMMONIA: AMMONIA, PLASMA: 35 umol/L — AB (ref 11–32)

## 2013-12-09 ENCOUNTER — Encounter: Payer: Self-pay | Admitting: Podiatry

## 2013-12-09 ENCOUNTER — Ambulatory Visit (INDEPENDENT_AMBULATORY_CARE_PROVIDER_SITE_OTHER): Payer: Medicare Other | Admitting: Podiatry

## 2013-12-09 DIAGNOSIS — L97511 Non-pressure chronic ulcer of other part of right foot limited to breakdown of skin: Secondary | ICD-10-CM

## 2013-12-09 DIAGNOSIS — E119 Type 2 diabetes mellitus without complications: Secondary | ICD-10-CM

## 2013-12-09 DIAGNOSIS — L97509 Non-pressure chronic ulcer of other part of unspecified foot with unspecified severity: Secondary | ICD-10-CM

## 2013-12-09 NOTE — Progress Notes (Signed)
She presents today for followup of her distal ulceration second digit of the right foot. She continues to soak daily and dress the toe daily. She continues her antibiotics on regular basis. She denies fever chills nausea vomiting muscle aches and pains and states the toe appears to be doing better.  Objective: Pulses are palpable right foot. Hammertoe deformity second digit right foot decrease in erythema with distal clavus was debrided does demonstrate ulceration the does not probe to bone.  Assessment: Hammertoe deformity with probable osteomyelitis and chronic ulceration at the tip of the toe second right.  Plan: Debridement of wound today redressed with a dry sterile compressive dressing continue all conservative therapies soaking and application of antibiotic ointment she will continue her last dose of antibiotics and I will followup with her when she is complete. We did discuss the possible need for digital amputation. She understands that and is amenable to it if necessary.

## 2013-12-22 ENCOUNTER — Ambulatory Visit: Payer: Medicare Other | Admitting: Podiatry

## 2013-12-23 ENCOUNTER — Ambulatory Visit: Payer: Medicare Other | Admitting: Podiatry

## 2014-01-10 ENCOUNTER — Ambulatory Visit (INDEPENDENT_AMBULATORY_CARE_PROVIDER_SITE_OTHER): Payer: Medicare Other | Admitting: Podiatry

## 2014-01-10 VITALS — BP 111/58 | HR 85 | Resp 16

## 2014-01-10 DIAGNOSIS — M86171 Other acute osteomyelitis, right ankle and foot: Secondary | ICD-10-CM

## 2014-01-10 DIAGNOSIS — L97511 Non-pressure chronic ulcer of other part of right foot limited to breakdown of skin: Secondary | ICD-10-CM

## 2014-01-10 NOTE — Progress Notes (Signed)
She presents today for followup of her ulceration second digit right foot. She denies fever chills nausea vomiting muscle aches and pains states that the toe does hurt some that it has become more red.  Objective: Vital signs are stable she is alert and oriented x3 dry sterile dressing on the second digit of the right foot removed does demonstrate palpable pulses right with ulceration and cellulitis to the level of the PIPJ.  Assessment: Osteomyelitis second digit right foot.  Plan: We considered her today for amputation disarticulation at the PIPJ she understands this and is amenable to it. Signed Dr. pages of the consent form. I will followup with her in the near its each her for surgical intervention.

## 2014-01-12 ENCOUNTER — Other Ambulatory Visit: Payer: Self-pay | Admitting: Podiatry

## 2014-01-12 MED ORDER — PROMETHAZINE HCL 25 MG PO TABS: 25.0000 mg | ORAL_TABLET | Freq: Three times a day (TID) | ORAL | Status: DC | PRN

## 2014-01-12 MED ORDER — OXYCODONE-ACETAMINOPHEN 5-325 MG PO TABS: 1.0000 | ORAL_TABLET | Freq: Four times a day (QID) | ORAL | Status: DC | PRN

## 2014-01-12 MED ORDER — AMOXICILLIN-POT CLAVULANATE 875-125 MG PO TABS: 1.0000 | ORAL_TABLET | Freq: Two times a day (BID) | ORAL | Status: DC

## 2014-01-13 ENCOUNTER — Telehealth: Payer: Self-pay | Admitting: *Deleted

## 2014-01-13 NOTE — Telephone Encounter (Signed)
PT WANTED TO KNOW IF SHE CAN RECEIVE HER RX FOR SURGERY TODAY. TOLD PT SHE WILL GET HER RX TOMORROW AFTER SURGERY. PT STATED "OK".

## 2014-01-14 ENCOUNTER — Encounter: Payer: Self-pay | Admitting: Podiatry

## 2014-01-14 DIAGNOSIS — L97511 Non-pressure chronic ulcer of other part of right foot limited to breakdown of skin: Secondary | ICD-10-CM

## 2014-01-14 DIAGNOSIS — M86171 Other acute osteomyelitis, right ankle and foot: Secondary | ICD-10-CM

## 2014-01-15 ENCOUNTER — Telehealth: Payer: Self-pay

## 2014-01-15 NOTE — Telephone Encounter (Signed)
Left message for patient to call office wit questions or concerns regarding post operative status

## 2014-01-15 NOTE — Progress Notes (Signed)
Dr Milinda Pointer performed a distal amputation 2nd met, right foot

## 2014-01-19 ENCOUNTER — Ambulatory Visit (INDEPENDENT_AMBULATORY_CARE_PROVIDER_SITE_OTHER): Payer: Medicare Other

## 2014-01-19 ENCOUNTER — Ambulatory Visit (INDEPENDENT_AMBULATORY_CARE_PROVIDER_SITE_OTHER): Payer: Medicare Other | Admitting: Podiatry

## 2014-01-19 VITALS — BP 120/67 | HR 75 | Temp 97.9°F | Resp 16

## 2014-01-19 DIAGNOSIS — M86171 Other acute osteomyelitis, right ankle and foot: Secondary | ICD-10-CM

## 2014-01-19 DIAGNOSIS — Z9889 Other specified postprocedural states: Secondary | ICD-10-CM

## 2014-01-19 NOTE — Progress Notes (Signed)
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 She denies fever chills nausea vomiting muscle aches and pains. States that the foot is feeling much better.  Objective: Vital signs are stable she is alert and oriented x3. Dry sterile dressing was removed demonstrates considerable amount of dried blood to the area the sutures are intact and the margins remain well coapted there is mild erythema to the surgical site but does not appear to be cellulitic in nature.  Assessment: Well-healing surgical toes second digit of the right foot.  Plan: Redressed today dressed a compressive dressing continue to keep this elevated as much as possible dry and clean and I will followup with her in one week at which time we'll try to remove some sutures.

## 2014-01-26 ENCOUNTER — Encounter: Payer: Medicare Other | Admitting: Podiatry

## 2014-01-31 ENCOUNTER — Encounter: Payer: Medicare Other | Admitting: Podiatry

## 2014-01-31 ENCOUNTER — Encounter: Payer: Self-pay | Admitting: Podiatry

## 2014-02-03 ENCOUNTER — Ambulatory Visit (INDEPENDENT_AMBULATORY_CARE_PROVIDER_SITE_OTHER): Payer: Medicare Other | Admitting: Podiatry

## 2014-02-03 VITALS — BP 118/69 | HR 109 | Resp 16

## 2014-02-03 DIAGNOSIS — B351 Tinea unguium: Secondary | ICD-10-CM

## 2014-02-03 DIAGNOSIS — Z9889 Other specified postprocedural states: Secondary | ICD-10-CM

## 2014-02-03 DIAGNOSIS — S90415A Abrasion, left lesser toe(s), initial encounter: Secondary | ICD-10-CM

## 2014-02-03 NOTE — Patient Instructions (Signed)
Continue with antibiotic ointment and a dressing over the incision. You can start to shower on Saturday, but do not soak your foot. When you get out of the shower, dry the area and apply a small amount of antibiotic ointment and a dressing.  On your left foot, apply antibiotic ointment and a band-aid over the areas on your 2nd and 4th toe.   We will be happy to cut your nails for you to help avoid future problems.  Call with any questions/concerns/change in symptoms.

## 2014-02-04 NOTE — Progress Notes (Signed)
Patient ID: [name], female   DOB: 10-Oct-1955, 58 y.o.   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 She states that she is not having any pain and she is no longer taking any antibiotics. She denies any systemic complaints such as fevers, chills, nausea, vomiting. She has been changing the dressing herself daily. No other complaints at this time. No acute changes since last appointment.  Objective: AAO x3, NAD DP/PT pulses palpable bilaterally, CRT less than 3 seconds Decreased protective sensation with Simms Weinstein monofilament Incision overlying the right foot second digit is well coapted without any evidence of dehiscence and sutures intact. Mild peri-incisional erythema however no ascending cellulitis, edema, increased warmth over the area. Upon evaluation on the left foot there are small abrasions overlying the second and fourth digit. The patient states that she try to cut her nails herself and these areas are likely result of cutting the skin when she cut the nails. There is no overlying erythema, edema, increased warmth. Hammertoe contractures bilaterally. Remaining nails elongated, discolored, dystrophic, brittle, discolored. No calf pain with compression, swelling, warmth, erythema.  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 -Sutures are removed and the right second digit without complications. Incision is coapted. Discussed with the patient she does start to get the area wet in 2 days however do not soak the foot. Apply a small amount of antibiotic ointment over the incision followed by a dressing. -On the left foot apply anabolic ointment over the 2 toes followed by a  dressing -Monitor for any clinical signs or symptoms of infection and directed to call the office immediately if any are to occur or go dressing to the emergency room. -Nails debrided x 8 without complications. Discussed with the patient she should not try to cut the nails herself.  -Discussed the importance of daily foot inspection. -Follow-up in 2 weeks or sooner if any problems are to arise. In the meantime call the office with any questions, concerns, change in symptoms.

## 2014-02-16 ENCOUNTER — Ambulatory Visit (INDEPENDENT_AMBULATORY_CARE_PROVIDER_SITE_OTHER): Payer: Medicare Other | Admitting: Podiatry

## 2014-02-16 VITALS — BP 119/39 | HR 78 | Resp 16

## 2014-02-16 DIAGNOSIS — Z9889 Other specified postprocedural states: Secondary | ICD-10-CM

## 2014-02-16 NOTE — Progress Notes (Signed)
She presents today for follow-up of amputation second digit of the right foot. She denies fever chills nausea vomiting muscle aches and pains.  Objective: Status post amputation disarticulation PIPJ second digit of the right foot with minimal edema and no erythema cellulitis drainage or odor.  Assessment: Well-healing surgical toe right foot.  Plan: Follow up with [place] as needed.

## 2014-03-14 ENCOUNTER — Encounter: Payer: Self-pay | Admitting: Podiatry

## 2014-04-20 ENCOUNTER — Ambulatory Visit: Payer: Medicare Other | Admitting: Podiatry

## 2014-04-25 ENCOUNTER — Ambulatory Visit: Payer: Medicare Other

## 2014-05-23 ENCOUNTER — Ambulatory Visit: Payer: Medicare Other

## 2014-05-26 ENCOUNTER — Ambulatory Visit: Payer: Medicare Other | Admitting: Podiatry

## 2014-06-02 ENCOUNTER — Ambulatory Visit: Payer: Medicare Other | Admitting: Podiatry

## 2014-06-07 ENCOUNTER — Ambulatory Visit (INDEPENDENT_AMBULATORY_CARE_PROVIDER_SITE_OTHER): Payer: Medicare Other | Admitting: Podiatry

## 2014-06-07 DIAGNOSIS — S91109A Unspecified open wound of unspecified toe(s) without damage to nail, initial encounter: Secondary | ICD-10-CM | POA: Diagnosis not present

## 2014-06-07 DIAGNOSIS — B351 Tinea unguium: Secondary | ICD-10-CM

## 2014-06-07 DIAGNOSIS — M79676 Pain in unspecified toe(s): Secondary | ICD-10-CM | POA: Diagnosis not present

## 2014-06-08 NOTE — Progress Notes (Signed)
Patient ID: [name], female   DOB: 05/16/1955, 59 y.o.   MRN: [id number]  Subjective: 59 y.o.-year-old female returns the office today for painful, elongated, thickened toenails which she is unable to do herself. She also states she has an wound on her right big toe. She had some skin that was pealing off and she torn the skin off and developed a wound.  Denies any redness or drainage around the nails or along the site of the wound.  Denies any acute changes since last appointment and no new complaints today. Denies any systemic complaints such as fevers, chills, nausea, vomiting.   Objective: AAO 3, NAD DP/PT pulses palpable, CRT less than 3 seconds Protective sensation decreased with Simms Weinstein monofilament, Achilles tendon reflex intact.  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  Nails hypertrophic, dystrophic, elongated, brittle, discolored 8. There is tenderness overlying these nails. There is no surrounding erythema or drainage along the nail sites. On the medial aspect the right hallux there is a fissure present with her granular wound base. The wound is not deep and there is no probing, undermining, tunneling. The wound measures approximately 1.2 cm x 0.2 cm. There is hyperkeratotic tissue surrounding the area. There is no surrounding erythema, ascending cellulitis, fluctuance, crepitus, malodor, drainage/purulence. No other open lesions or pre-ulcer lesions identified bilaterally. There is hallux malleus of bilateral hallux. No other areas of tenderness bilateral lower extremities. No overlying edema, erythema, increased warmth. No pain with calf compression, swelling, warmth, erythema.  Assessment: Patient presents with symptomatic onychomycosis, superficial wound/fissure right medial hallux  Plan: -Treatment options including alternatives, risks, complications were discussed -Nails sharply debrided 8 without  complication/bleeding. -Hyperkeratotic lesion right medial hallux sharply debrided without complications. The wound was debrided to healthy, bleeding, granular wound base. Recommended to continue with antibiotic ointment and a dressing overlying the area daily. Monitor for any signs or symptoms of infection and directed to call the office immediately should any occur or go to the ER. Discussed possible surgical intervention the future to help straighten her digits to help prevent recurrence of the ulcer. -Discussed daily foot inspection. If there are any changes, to call the office immediately.  -Follow-up in 2 weeks for wound check of the right hallux or sooner if any problems are to arise. In the meantime, encouraged to call the office with any questions, concerns, changes symptoms. *X-ray right foot next appointment.

## 2014-06-21 ENCOUNTER — Ambulatory Visit: Payer: Medicare Other | Admitting: Podiatry

## 2014-07-23 NOTE — Op Note (Signed)
PATIENT NAME:  [name], [name] MR#:  [id number] DATE OF BIRTH:  06-Nov-1955  DATE OF PROCEDURE:  09/21/2013  PREOPERATIVE DIAGNOSIS: Chronic cholecystitis, cholelithiasis, umbilical hernia, cirrhosis.   POSTOPERATIVE DIAGNOSIS: Chronic cholecystitis, cholelithiasis, umbilical hernia, cirrhosis.   PROCEDURE: Laparoscopic cholecystectomy with umbilical hernia repair.   SURGEON: Loreli Dollar, MD  ANESTHESIA: General.   INDICATIONS: This 59 year old female with a history of nonalcoholic steatohepatitis and cirrhosis has had recent right upper quadrant pains and ultrasound findings of gallstones, also a prominent umbilical hernia. The umbilical hernia presented with a bulge of approximately 4 cm, and repair of the hernia was recommended as a part of the operation.   DESCRIPTION OF PROCEDURE: With the patient on the operating table in the supine position under general anesthesia, the abdomen was prepared with ChloraPrep and draped in a sterile manner. A transversely oriented infraumbilical incision was made some 4 cm in length, carried down through subcutaneous tissues to encounter umbilical hernia sac. The sac was dissected free from surrounding structures and was approximately 5 cm in length and was dissected down to the fascial ring defect. There was an approximately 1.8 cm hard mass within the sac. The sac was opened. This mass appeared that it may be a hematoma. Next, the sac was dissected free from the surrounding fascial ring defect. An opening was made in the sac just above the fascia to insert the Veress needle and the 10 mm cannula. The 0 Surgilon was passed around the sac and tied down on to secure this around the cannula for an airtight seal. The abdominal cavity was insufflated with carbon dioxide. The 0 degree 10 mm laparoscope was inserted. There was obvious cirrhosis of the liver with nodularity and firmness. There was a trace of ascites. Next, another incision was made in the epigastrium  slightly to the right of the midline to introduce an 11 mm cannula. Two incisions were made in the lateral aspect of the right upper quadrant to introduce two 5 mm cannulas. I could see that the liver margin was well below the costal margin, and these incisions were placed in appropriate place according to the anatomy of the liver. The gallbladder was retracted towards the right shoulder. Multiple adhesions were taken down between the gallbladder and the liver using hook and cautery. The porta hepatis was demonstrated. The gallbladder neck was mobilized. There was marked induration in the gallbladder and difficult to demonstrate tissue planes. Circumferential dissection was carried out around the cystic artery and around the gallbladder neck. There was much induration and did not appear to be safe to dissect further down towards the cystic duct. The cystic artery was controlled with Endo Clips and divided. The gallbladder neck was further mobilized and dissected circumferentially. The 11 mm port was exchanged for a 12 mm port and inserted a blue cartridge Endo GIA stapler, which was placed across the gallbladder neck and divided. The staple line appeared to be hemostatic. Next, the gallbladder was dissected free from the liver with use of hook and cautery, again finding induration of the wall of the gallbladder. It was noted that during the course of the dissection, there was some drainage of bile which was clear and was aspirated. A number of small stones came out of the gallbladder and were retrieved with the stone scoop. The gallbladder was completely separated from the liver and delivered up through the infraumbilical port and submitted in formalin for routine pathology. The right upper quadrant was further inspected, irrigated and aspirated. Several  small bleeding points in the gallbladder bed were cauterized. Hemostasis subsequently appeared to be intact. The trace of ascites lateral to the liver and in the  pelvis was aspirated. The operative field was further irrigated with saline solution and aspirated. Hemostasis appeared to be intact. Next, the cannulas were removed, allowing carbon dioxide to escape from the peritoneal cavity. After removing the cannula from the hernia sac, the sac was suture ligated with 0 Maxon, and the sac was excised and was sent in formalin with the gallbladder for routine pathology. This did include the 1.8 cm mass which appeared that it may be a hematoma. Next, the fascial ring defect was closed with a transversely oriented suture line of interrupted 0 Maxon figure-of-eight sutures. All skin incisions were closed with 4-0 chromic subcuticular sutures, benzoin and Steri-Strips. The tissues surrounding the incisions were infiltrated with 0.5% Sensorcaine with epinephrine. Dressings were applied with paper tape.   The patient appeared to be in satisfactory condition and was prepared for transfer to the recovery room.   This operation was significantly more difficult than the typical gallbladder case, including the repair of the large umbilical hernia, findings of induration and prolonged operation lasting approximately an hour and 45 minutes.   ____________________________ Lenna Sciara. Rochel Brome, MD jws:lb D: 09/21/2013 09:51:37 ET T: 09/21/2013 10:10:03 ET JOB#: [id number]  cc: Loreli Dollar, MD, <Dictator> Loreli Dollar MD ELECTRONICALLY SIGNED 09/21/2013 18:55

## 2014-07-24 NOTE — H&P (Signed)
PATIENT NAME:  [name], [name] MR#:  [id number] DATE OF BIRTH:  Sep 02, 1955  DATE OF ADMISSION:  06/23/2011  CHIEF COMPLAINT AND IDENTIFYING DATA: Ms. Maloni Musleh is a 59 year old female presenting to the Emergency Department of [facility] on 03/23 with delusions and auditory hallucinations as well as severe anxiety.   HISTORY OF PRESENT ILLNESS: Ms. Botkins was recently discharged from the inpatient behavioral health unit at [facility]. At that time her psychosis had resolved and her depression symptoms had resolved. She was not having any anxiety.   However, within the past week severe feelings on edge, muscle tension, depressed mood, suicidal thoughts, auditory hallucinations, and delusions have returned. She hears her son talking underneath the house even though he lives over an hour away. She also believes that he is crawling in a crawl space under the house.   The patient also was stating that her husband and son were going to burn her house down. However, all of this discussion of people at her house was not consistent with what the police found; they found no one under the house or around the house.   She has been taking her sertraline 100 mg b.i.d. as well as Fanapt.  PAST MEDICAL HISTORY:  1. Hypertension.  2. Diabetes.  3. Chronic pain with fibromyalgia.  PAST PSYCHIATRIC HISTORY: Ms. Breunig does have an extensive psychiatric history.  She has recurrent episodes of major depression with psychosis.  She has no history of suicide attempts. She has been on Celexa in the past along with Wellbutrin.   FAMILY PSYCHIATRIC HISTORY:  Her brother committed suicide by shooting himself at age 6. Her father also had mental illness of an unspecified nature requiring admission to a psychiatric hospital after the death of the patient's brother.   SOCIAL HISTORY: The patient worked at [employer] after high school. She worked three years in [employer] and now she is on disability. She was married for 10 years. She has a 33 year old son and a 1 year old daughter. She has tried alcohol in the past but has no history of social or medical secondary problems. She does not drink recently or currently. She has not used any illegal substances. She has no history of legal charges.   She was born in [place], [place].  She did graduate from high school. No college.   MEDICATIONS UPON ADMISSION:  1. Sertraline 100 mg daily.  2. Fanapt oral daily.   ALLERGIES: No known drug allergies.   LABORATORY DATA: TSH normal. Aspirin negative. Platelet count normal. Hemoglobin mildly decreased at 10.4. SGOT normal. SGPT normal. BUN and creatinine normal. Tylenol negative. Urine drug screen positive for tricyclic compounds.   REVIEW OF SYSTEMS: Constitutional,  HEENT, mouth, neurologic, psychiatric, cardiovascular, respiratory, gastrointestinal, genitourinary, skin, musculoskeletal, hematologic, lymphatic, endocrine, metabolic all unremarkable.   PHYSICAL EXAMINATION:  VITAL SIGNS: Temperature 97.9, pulse 68, respiratory rate 20, blood pressure 111/64.   GENERAL: The patient was examined with a female escort.   HEENT:  Normocephalic, atraumatic. Pupils equally round and reactive to light and accommodation. Oropharynx clear without erythema. Hearing intact bilaterally to finger rub.   EXTREMITIES: No cyanosis, clubbing, or edema.   SKIN: Normal turgor. No rashes.   RESPIRATORY: Clear to auscultation. No wheezing, rhonchi, or rales.    CARDIOVASCULAR: Regular rate and rhythm. No murmurs, rubs, or gallops.   ABDOMEN: Bowel sounds positive. Soft, nontender.   GENITOURINARY: Deferred.   NEUROLOGIC: Cranial nerves II through XII intact. General sensory  intact throughout to light touch. Motor 5/5 strength throughout. Deep tendon reflexes normal strength and symmetry throughout. No Babinski. Coordination intact by finger-to-nose bilaterally.    MENTAL STATUS EXAM:  Ms. Issac has no abnormal involuntary movements. She has no cachexia. Her muscle tone is normal. Her grooming and hygiene are normal.   Ms. Easterday has normal eye contact. He is alert. She is oriented to all spheres. Her memory is intact to immediate, recent, and remote. Her concentration is moderately decreased. Her fund of knowledge, use of language, and intelligence are grossly within normal limits. Her speech is soft. There is some blocking. The rate is mildly slowed. There is no dysarthria. Thought process is mildly slowed. Thought process is coherent. There are no tangents or looseness of associations. Thought content: As in the history of present illness. She does contract for no harm in the hospital. There are no hallucinations.   Affect is anxious. Mood is depressed. Insight is poor. Judgment is impaired.   ASSESSMENT:  AXIS I:  Major depressive disorder, recurrent, with psychotic features.   AXIS II: Deferred.   AXIS III: Diabetes, chronic pain, fibromyalgia. History of bilateral pitting edema.   AXIS IV: Primary support group.   AXIS V: 25.   Ms. Stark Jock would be at risk for harming herself as well as neglecting herself outside of the supportive environment of the hospital.   PLAN:  1. We will admit to the inpatient behavioral health unit; as her hospital course progresses she will engage in milieu and group psychotherapy.   2. She has a questionable history of recent noncompliance. She will be placed on Risperdal 2 mg b.i.d., Wellbutrin XL 300 mg daily for anti depression, Risperdal for anti- psychosis and augmenting Wellbutrin.    ____________________________ Drue Stager. Durrel Mcnee, MD jsw:bjt D: 06/24/2011 22:11:26 ET T: 06/25/2011 08:57:44 ET JOB#: [id number]  cc: Drue Stager. Mellony Danziger, MD, <Dictator> Billie Ruddy MD ELECTRONICALLY SIGNED 06/28/2011 10:18

## 2014-07-24 NOTE — Discharge Summary (Signed)
PATIENT NAME:  Jenna, Ramirez MR#:  [id number] DATE OF BIRTH:  Sep 18, 1955  DATE OF ADMISSION:  05/25/2011 DATE OF DISCHARGE:  06/06/2011  HISTORY OF PRESENT ILLNESS: [name] is a 59 year old female admitted to the inpatient behavioral unit on the 23rd of February with psychosis. She was having delusions that her son was crawling in the crawl space of her house when he actually was located in [place]. She was also having auditory hallucinations.   Please see the admission history and physical.   ANCILLARY CLINICAL DATA: [name] was continuing to have her typical baseline bilateral pitting edema. She was given knee-high stockings and was continued on her Lasix with no complications.   HOSPITAL COURSE: [name] was admitted to the inpatient behavioral unit and underwent milieu and group psychotherapy. Initially, she continued with her psychosis and believed that her son was actually crawling in the vicinity of the hospital. She was responding to internal stimuli. She was placed on Risperdal 2 mg b.i.d. and was continued on her Wellbutrin for anti-depression.   Her thought process eventually became coherent, also her hallucinations and delusions resolved.   CONDITION ON DISCHARGE: By March 7th, [name] was behaving normally in the milieu as well as group therapy sessions. She was no longer having hallucinations or delusions. Her mood and interest were normal.   MENTAL STATUS EXAM UPON DISCHARGE: [name] is alert. Her eye contact is good. She is oriented to all spheres. Abstraction is intact. Fund of knowledge, intelligence, and use of language are normal. Memory is intact to immediate, recent, and remote. Speech involves normal rate and prosody without dysarthria. Thought process is logical, coherent, and goal directed without looseness of associations. Thought content: She does have some vague speech intermittently, but there is no bizarre nature. She has no  hallucinations or delusions. She has no thoughts of harming herself or others. Affect is broad and appropriate. Mood is normal. Insight is intact. Judgment is intact.   DISCHARGE DIAGNOSES:   AXIS I: Major depressive disorder, with psychotic features, in clinical remission.   AXIS II: Deferred.   AXIS III:  1. Diabetes. 2. Fibromyalgia. 3. Chronic pain. 4. Bilateral pitting edema.   AXIS IV: Primary support group.   AXIS V: 55.   DISCHARGE MEDICATIONS:  1. Bisacodyl 5 mg daily. 2. Bupropion 300 mg XL daily. 3. Lasix 20 mg b.i.d.  4. Ibuprofen 600 mg b.i.d.  5. Metformin 500 mg b.i.d.  6. Methocarbamol 750 mg t.i.d.  7. Risperdal 2 mg b.i.d.  8. Sulfadiazine as needed. 9. Tylenol 500 mg every four hours p.r.n.   DIET: [employer].   FOLLOW-UP: The patient will followup with [facility], Dr. Kasandra Knudsen, 03/11 at 1:00 p.m., Dr. Gwenlyn Perking, [place] [facility] on 03/13 at 10:30 a.m. for her general medical problems.  ____________________________ Drue Stager. Letta Cargile, MD jsw:ap D: 06/09/2011 21:52:08 ET T: 06/10/2011 11:00:29 ET JOB#: [id number] cc: Drue Stager. Saket Hellstrom, MD, <Dictator> Billie Ruddy MD ELECTRONICALLY SIGNED 06/13/2011 12:22

## 2014-07-24 NOTE — H&P (Signed)
PATIENT NAME:  [name], [name] MR#:  [id number] DATE OF BIRTH:  Aug 13, 1955  DATE OF ADMISSION:  05/25/2011  INITIAL ASSESSMENT AND PSYCHIATRIC EVALUATION   IDENTIFYING INFORMATION: Patient is a 59 year old white female not employed and has been on disability for "chronic pain secondary to fibromyalgia" for many years. Patient is divorced for two years after being married for seven years. Patient lives by herself in a house that has 1000 square feet and her daughter moved out two years ago to live by herself in an apartment. Patient comes for her first inpatient hospitalization in psychiatry at [facility] with a chief complaint "I thought I was hearing voices and I told Social Services and showed a card." And then she said "Police got involved and they were concerned." Patient continued to talk at this rate.   HISTORY OF PRESENT ILLNESS: When patient was asked when she last felt well she reported "Not felt well in quite some time. Was going to see Dr. Kasandra Ramirez at [facility] as they moved and I was being followed by [facility]." Patient reports that the crawl space under her house and she felt that her son was there and he is 72 years old and he is released from the prison after being there for two years and when she was asked the reason why he went to prison she said "He is evil.". Patient reports that he and his in-laws and her ex-husband they are all together and they have been plotting against her and they cause lots of problems and she was afraid of them and she suspects that he is in the crawl space and he was talking and she felt that she heard his voice and so she made complaint and she was brought here for help.   PAST PSYCHIATRIC HISTORY: No previous history of inpatient hospitalization in psychiatry. No history of suicide attempts. She reports that she was being followed by [facility] and she was on following medications: Celexa 40 mg p.o. daily, Wellbutrin XL 300 mg every  day and amitriptyline twice a day for her pain in her legs and fibromyalgia. In addition she takes Advil and Tylenol for he chronic pain. Patient was referred to be followed by Dr. Kasandra Ramirez and she has not seen him in a long time and plans to go for the appointment but currently Mental Health has lots of changes and so she is having difficulty with her appointments.   FAMILY HISTORY OF MENTAL ILLNESS: Brother committed suicide and he shot himself at age 71 years and she and her parents were downstairs when he shot himself upstairs. Father had a nervous breakdown and was admitted to the hospital after the death of her brother.  FAMILY HISTORY: Raised by parents. Father was a laborer. Father died while he was in his 45s. Mother worked in Engineer, structural health system for many years. Mother died when she was in her 64s. Has one brother who committed suicide. One sister who is a Marine scientist and she is very busy.   PERSONAL HISTORY: Born in [place], [place]. Moved to [place] 20 years ago. Graduated from high school. No college.  WORK HISTORY: First job was at [employer] at age 71 years. This was a summer job and lasted for three months. Longest job that she has ever held was working for three years and worked in [employer].. Last worked many years ago and probably in 2001.   MILITARY HISTORY: None.  MARRIAGES: Married once. Marriage lasted  for less than 10 years. Cause of divorce "mental abuse". Has two children a 49 year old son and 58 year old daughter. 45 year old daughter works in [employer] at [employer]. 52 year old son had been to prison and the reason she gave was "He is evil".   ALCOHOL AND DRUGS: First drink of alcohol age 61 years. No problem with alcohol drinking. No history of DWI. Never arrested for public drunkenness. Denies street or prescription drug abuse. Denies using IV drugs. Denies smoking nicotine cigarettes.   LEGAL HISTORY: Never been to jail. Never been on probation. No problems with  legal system.   PAST MEDICAL HISTORY: No known high blood pressure. Diabetes mellitus and on medications for the same which is well controlled. No major surgeries. No major injuries. No history of motor vehicle accident. Never been unconscious. Suffers from chronic pain secondary to fibromyalgia and is on medications for the same. Being followed by [facility]. She had appointment a week ago and she missed appointment and she plans to make the appointment.   PHYSICAL EXAMINATION: VITAL SIGNS: Temperature 98.8, pulse 70 per minute regular, respirations 20 per minute regular, blood pressure 110/60 mmHg.  HEENT: Head is normocephalic, atraumatic. Pupils are equal, round, and reactive to light and accommodation. Fundi bilaterally benign. Extraocular movements visualized. Tympanic membrane visualized, no exudates.   NECK: Supple without organomegaly, lymphadenopathy, thyromegaly.   CHEST: Normal expansion. Normal breath sounds heard.   HEART: Normal S1, S2 without any murmurs or gallops.  ABDOMEN: Soft, obese. Bowel sounds heard.   PELVIC/RECTAL: Deferred.  NEUROLOGICAL: Gait is normal. Romberg is negative. Cranial nerves II through XII grossly intact. DTRs 2+ and normal. Plantars are normal response.   MENTAL STATUS EXAMINATION: Patient is dressed in street clothes. Alert and oriented to place, person and time with a little prompting and help. Affect is flat with mood depressed. Does admit feeling hopeless and helpless, feeling worthless and useless. Denies any suicidal, homicidal ideas or plans. Admits feeling paranoid and suspicious and admits that she hears voices of her son who happens to be staying in the crawl space under the house. This has been bothering her because he along with his in-laws and her ex-husband are plotting against her and this has been a great concern to her recently. In addition she reported that they have been making drugs in the crawl space and they are boiling some kind  of stuff in order to make these drugs and after the police arrive to check on it they threw away all that stuff and patient continued to talk at this rate. Patient could not count money and she said that 50 pennies in a dollar. Patient could spell the word world forward but could not spell it backward. When she was asked about stamped and addressed envelope she reported that she will keep it. Very concrete in her expressions. General knowledge and information is very vague. Does admit that she sleeps too much and admits that appetite is erratic and not good. Denies any ideas to hurt herself or others. Insight and judgement guarded.  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  AXIS II: Deferred.  AXIS III: 1. Fibromyalgia and chronic pain.  2. Diabetes.   AXIS IV: Severe-Patient lives by herself, not much family support, in fact she calls her son evil and making drugs under her house.   AXIS V: Global Assessment of Functioning 20.   ASSESSMENT AND PLAN: Patient is admitted to [facility] for close observation, evaluation and help. She will be  started back on all of the medications slowly and also will be started on antipsychotic medication to control the problem of her hearing voices. Social Services contact daughter who works at [employer] to get further information so that appropriate medications will be given and patient can be helped. During the stay in the hospital she will be given milieu therapy and supportive counseling as soon as she is stable enough to take part. She will be taking part in individual and group therapy. Appropriate follow-up appointments will be made after patient is stabilized.   ____________________________ Wallace Cullens. Franchot Mimes, MD skc:cms D: 05/26/2011 20:20:49 ET T: 05/27/2011 06:37:50 ET JOB#: [id number]  cc: Arlyn Leak K. Franchot Mimes, MD, <Dictator> Dewain Penning MD ELECTRONICALLY SIGNED 06/02/2011 20:21

## 2014-07-24 NOTE — Discharge Summary (Signed)
PATIENT NAME:  [name], [name] MR#:  [id number] DATE OF BIRTH:  1955/10/18  DATE OF ADMISSION:  06/23/2011 DATE OF DISCHARGE:  07/01/2011  HISTORY OF PRESENT ILLNESS: [name] is a 59 year old female admitted to the [place] [facility] on March 24th due to recurrence of psychosis.   She was displaying catastrophic anxiety with delusions, auditory hallucinations, and suicidal thoughts. Her delusion that her son was talking and crawling underneath the house had returned (the son lives over an hour away). Please see the admission history and physical dictation.   ANCILLARY CLINICAL DATA: None.   HOSPITAL COURSE: [name] was admitted to the [place] [facility]. Initially she was very withdrawn to her room, however, as the days progressed she showed increased energy and began attending groups. Towards the end of admission, she declined attending some of the groups stating that she already had spent enough time in group therapy. However, she remained alert with good concentration and no delusions by March 29th.   Her hallucinations had also resolved by March 29th. Her energy continued to improve and by March 31st her interests and constructive future goals were intact. She was looking forward to seeing her puppy. Her appetite had returned to normal.   She had been given her Risperdal and bupropion dosages that had worked during her last admission: Risperdal 2 mg b.i.d., Wellbutrin 300 mg daily.   CONDITION ON DISCHARGE: By April 1st [name] has normal energy and interests. She has no thoughts of harming herself or others. She has no hallucinations or delusions. She is oriented to all spheres. Her memory function is intact. She is not having any adverse medication effects. She is socially appropriate.   MENTAL STATUS EXAM UPON DISCHARGE: [name] is alert. Her eye contact is good. Concentration is normal. Affect is slightly anxious at baseline but with a  broad and appropriate range. She is oriented completely to all spheres. Her memory is intact to immediate, recent, and remote. Her fund of knowledge, intelligence, and use of language are normal. Her speech involves normal rate and prosody without dysarthria. Thought process is logical, coherent, and goal directed. No looseness of associations. Thought content no thoughts of harming herself. No thoughts of harming others. No delusions or hallucinations. Insight is intact. Mood is normal. Judgment is intact.   DISCHARGE DIAGNOSES:  AXIS I: Major depressive disorder, recurrent, with psychotic features, rule out schizoaffective disorder, now clinically in remission.   AXIS II: Deferred.   AXIS III:  1. Diabetes. 2. Fibromyalgia. 3. History of bilateral pitting edema in the lower extremities which is compensated for with the patient's stockings and diuretic treatment.   AXIS IV: Primary support group.   AXIS V: 55.   [name] is not at risk to harm herself or others. She agrees to call emergency services immediately for any thoughts of harming herself, thoughts of harming others, hallucinations or distress.   The patient's residence is without running water and is suboptimal at this time. Therefore, the undersigned recommended that the patient be discharged to a group home. However, the patient declined and she is not committable.   DIET: Regular.   ACTIVITY: Routine.   DISCHARGE MEDICATIONS:  1. Wellbutrin 300 mg daily. 2. Lasix 20 mg b.i.d.  3. Metformin 500 mg b.i.d.  4. Methocarbamol 750 mg t.i.d. p.r.n. muscle spasm.  5. Potassium chloride 20 mEq daily. 6. Risperdal 2 mg b.i.d.   FOLLOW-UP: 1. Psychiatric medication follow-up with Dr. Alexis Goodell in [place], [place]  [place] April 4th at 2 p.m.  2. She will also follow-up with her primary care physician within a week to 10 days of discharge.   She has a medication supply of all of the above medicines adequate with the time  between discharge and her follow-up appointments.  ____________________________ Drue Stager. Jenna Bocek, MD jsw:drc D: 07/01/2011 20:53:36 ET T: 07/02/2011 13:36:40 ET JOB#: [id number]  cc: Drue Stager. Eyob Godlewski, MD, <Dictator> Billie Ruddy MD ELECTRONICALLY SIGNED 07/04/2011 20:31

## 2014-07-27 ENCOUNTER — Other Ambulatory Visit: Payer: Self-pay | Admitting: Gastroenterology

## 2014-07-27 DIAGNOSIS — J90 Pleural effusion, not elsewhere classified: Secondary | ICD-10-CM

## 2014-07-28 ENCOUNTER — Ambulatory Visit
Admit: 2014-07-28 | Disposition: A | Discharge status: Home / Self Care | Payer: Self-pay | Attending: Gastroenterology | Admitting: Gastroenterology

## 2014-07-28 LAB — BASIC METABOLIC PANEL
Anion Gap: 8 (ref 7–16)
BUN: 7 mg/dL
CALCIUM: 9.7 mg/dL
CO2: 27 mmol/L
Chloride: 99 mmol/L — ABNORMAL LOW
Creatinine: 0.51 mg/dL
GLUCOSE: 114 mg/dL — AB
Potassium: 3.9 mmol/L
Sodium: 134 mmol/L — ABNORMAL LOW

## 2014-08-05 ENCOUNTER — Other Ambulatory Visit: Payer: Self-pay | Admitting: Gastroenterology

## 2014-08-05 DIAGNOSIS — R0602 Shortness of breath: Secondary | ICD-10-CM

## 2014-08-10 ENCOUNTER — Ambulatory Visit
Admission: RE | Admit: 2014-08-10 | Discharge: 2014-08-10 | Disposition: A | Discharge status: Home / Self Care | Payer: Medicare Other | Source: Ambulatory Visit | Attending: Gastroenterology | Admitting: Gastroenterology

## 2014-08-10 DIAGNOSIS — E042 Nontoxic multinodular goiter: Secondary | ICD-10-CM | POA: Diagnosis not present

## 2014-08-10 DIAGNOSIS — J9811 Atelectasis: Secondary | ICD-10-CM | POA: Diagnosis not present

## 2014-08-10 DIAGNOSIS — R0602 Shortness of breath: Secondary | ICD-10-CM | POA: Diagnosis present

## 2014-08-10 DIAGNOSIS — I700 Atherosclerosis of aorta: Secondary | ICD-10-CM | POA: Diagnosis not present

## 2014-08-10 HISTORY — DX: Type 2 diabetes mellitus without complications: E11.9

## 2014-08-10 MED ORDER — IOHEXOL 300 MG/ML  SOLN
75.0000 mL | Freq: Once | INTRAMUSCULAR | Status: AC | PRN
  Administered 2014-08-10: 75 mL via INTRAVENOUS

## 2014-08-15 ENCOUNTER — Ambulatory Visit: Payer: Medicare Other | Admitting: Podiatry

## 2014-08-16 ENCOUNTER — Ambulatory Visit: Payer: Medicare Other | Admitting: Podiatry

## 2014-08-23 ENCOUNTER — Ambulatory Visit (INDEPENDENT_AMBULATORY_CARE_PROVIDER_SITE_OTHER): Payer: Medicare Other | Admitting: Podiatry

## 2014-08-23 ENCOUNTER — Encounter: Payer: Self-pay | Admitting: Podiatry

## 2014-08-23 VITALS — Ht 65.0 in | Wt 229.0 lb

## 2014-08-23 DIAGNOSIS — L97511 Non-pressure chronic ulcer of other part of right foot limited to breakdown of skin: Secondary | ICD-10-CM

## 2014-08-23 DIAGNOSIS — L89891 Pressure ulcer of other site, stage 1: Secondary | ICD-10-CM | POA: Diagnosis not present

## 2014-08-23 DIAGNOSIS — L84 Corns and callosities: Secondary | ICD-10-CM

## 2014-08-25 NOTE — Progress Notes (Signed)
Patient ID: [name], female   DOB: 05/16/1955, 59 y.o.   MRN: [id number]  Subjective: 59 year old female presents the office today with complaints of thick callus the right big toe. She states that she keeps giving me every occur and thick callus overlying this area which becomes irritated with shoes. She denies any redness around the area when he red streaks or any drainage. Denies any systemic complaints such as fevers, chills, nausea, vomiting. No acute changes since last appointment, and no other complaints at this time.   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  On the medial aspect of the right hallux there is a thick hyperkerotic lesion. Upon debridement, there was a small, superficial, graunular ulceration measuring approximately 0.2 x 0.2 cm. There is no surrounding erythema, ascending cellulitis, drainage/purulence, malodor.  No areas of pinpoint bony tenderness or pain with vibratory sensation.  No edema, erythema, increase in warmth to bilateral lower extremities.  No other open lesions or pre-ulcerative lesions.  No pain with calf compression, swelling, warmth, erythema  Assessment: 59 year old female with pre-ulcerative callus right medial hallux  Plan: -All treatment options discussed with the patient including all alternatives, risks, complications.  - Hyperkeratotic lesions were sharply debrided without complication/bleeding. Recommended the patient to apply anabolic ointment and a bandage over the wounds of the right hallux daily. - Monitor closely for any clinical signs or symptoms of infection should call the office immediately should any occur go to the ER. - Follow-up as scheduled next couple weeks for nail debridement and for further evaluation of the limb. However encourage call the office sooner if any  problems/concerns.  -Patient encouraged to call the office with any questions, concerns, change in symptoms.

## 2014-09-08 ENCOUNTER — Ambulatory Visit: Payer: Medicare Other | Admitting: Podiatry

## 2014-09-22 ENCOUNTER — Ambulatory Visit: Payer: Medicare Other | Admitting: Podiatry

## 2014-09-26 ENCOUNTER — Ambulatory Visit: Payer: Medicare Other

## 2014-09-29 ENCOUNTER — Encounter: Payer: Self-pay | Admitting: Podiatry

## 2014-09-29 ENCOUNTER — Ambulatory Visit (INDEPENDENT_AMBULATORY_CARE_PROVIDER_SITE_OTHER): Payer: Medicare Other

## 2014-09-29 ENCOUNTER — Ambulatory Visit (INDEPENDENT_AMBULATORY_CARE_PROVIDER_SITE_OTHER): Payer: Medicare Other | Admitting: Podiatry

## 2014-09-29 DIAGNOSIS — L03031 Cellulitis of right toe: Secondary | ICD-10-CM

## 2014-09-29 DIAGNOSIS — B351 Tinea unguium: Secondary | ICD-10-CM

## 2014-09-29 DIAGNOSIS — L97519 Non-pressure chronic ulcer of other part of right foot with unspecified severity: Secondary | ICD-10-CM | POA: Diagnosis not present

## 2014-09-29 DIAGNOSIS — M79676 Pain in unspecified toe(s): Secondary | ICD-10-CM

## 2014-09-29 DIAGNOSIS — L02611 Cutaneous abscess of right foot: Secondary | ICD-10-CM

## 2014-09-29 MED ORDER — CEPHALEXIN 500 MG PO CAPS: 500.0000 mg | ORAL_CAPSULE | Freq: Three times a day (TID) | ORAL | Status: DC

## 2014-09-29 NOTE — Patient Instructions (Signed)
Continue with daily dressing changes. Monitor for any signs/symptoms of infection. Call the office immediately if any occur or go directly to the emergency room. Call with any questions/concerns.

## 2014-09-30 NOTE — Progress Notes (Signed)
Patient ID: Charlissa Petros, female   DOB: [birth year], 59 y.o.   MRN: [id number]  Subjective: 59 y.o. female returns the office today for painful, elongated, thickened toenails which she is unable to trim herself. She states the left 2nd digit toenail got long and she got it caught in her sock and made the nail bleed some. Denies any redness or drainage around the nails. Also states the callus on her right foot has returned. She has purchased OTC corn and callus remover. Denies any acute changes since last appointment and no new complaints today. Denies any systemic complaints such as fevers, chills, nausea, vomiting.   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  Nails hypertrophic, dystrophic, elongated, brittle, discolored 8. There is tenderness overlying the nails 1-5 on the left and 1,4,5 on the right. There is no surrounding erythema or drainage along the nail sites. On the medial aspect of the right hallux versus thick hyperkeratotic lesion. Upon debridement there is a small amount of purulence expressed. There is an underlying ulceration measuring possible 0.8 x 0.6 cm with a granular wound base. The wound does not probe to bone, undermining or tunneling. There is minimal surrounding erythema without any ascending cellulitis, fluctuance, crepitus, malodor. Once the area was debrided no further purulence was expressed. There did not appear to be any deep abscess. Hallux malleous present on the right.  No other open lesions or pre-ulcerative lesions are identified. No other areas of tenderness bilateral lower extremities. No overlying edema, erythema, increased warmth. No pain with calf compression, swelling, warmth, erythema.  Assessment: Patient presents with symptomatic onychomycosis; pre-ulcerative callus right medial hallux with underlying  ulceration and localized purulence  Plan: -X-rays were obtained and reviewed with the patient.  -Treatment options including alternatives, risks, complications were discussed -Nails sharply debrided 8 without complication/bleeding. -Lesion right hallux sharp debrided. Upon reaming there was an area of superficial purulence expressed and this area was cultured. Underlying wound appear to be granular and clean and no further purulence was expressed after debridement. Iodosorb was applied over the area followed by dry sterile dressing. Recommend her to continue daily dressing changes with antibiotic ointment and a bandage. Monitor for any clinical signs or symptoms of infection and directed to call the office immediately should any occur or go to the ER. -Rx Keflex  -Discussed daily foot inspection. If there are any changes, to call the office immediately.  -Follow-up in 2-3 weeks or sooner if any problems are to arise. In the meantime, encouraged to call the office with any questions, concerns, changes symptoms.   Celesta Gentile, DPM

## 2014-10-12 ENCOUNTER — Telehealth: Payer: Self-pay | Admitting: *Deleted

## 2014-10-12 MED ORDER — AMOXICILLIN-POT CLAVULANATE 875-125 MG PO TABS
1.0000 | ORAL_TABLET | Freq: Two times a day (BID) | ORAL | Status: DC
Start: 2014-10-12 — End: 2015-04-18

## 2014-10-12 NOTE — Telephone Encounter (Signed)
Spoke to patient she stated that she was doing ok, but it was starting to get crusty again, dr Jacqualyn Posey wants to switch her to augmentin 875 bid x 10 days  rx sent to pharmacy

## 2014-10-20 ENCOUNTER — Ambulatory Visit: Payer: Medicare Other | Admitting: Podiatry

## 2014-11-01 ENCOUNTER — Ambulatory Visit: Payer: Medicare Other | Admitting: Podiatry

## 2014-11-01 ENCOUNTER — Encounter: Payer: Self-pay | Admitting: Podiatry

## 2014-11-10 ENCOUNTER — Other Ambulatory Visit: Payer: Self-pay | Admitting: Nurse Practitioner

## 2014-11-11 ENCOUNTER — Other Ambulatory Visit: Payer: Self-pay | Admitting: Nurse Practitioner

## 2014-11-11 DIAGNOSIS — K7460 Unspecified cirrhosis of liver: Secondary | ICD-10-CM

## 2014-11-11 DIAGNOSIS — R188 Other ascites: Principal | ICD-10-CM

## 2014-11-15 ENCOUNTER — Ambulatory Visit: Payer: Medicare Other

## 2014-11-15 ENCOUNTER — Ambulatory Visit: Payer: Medicare Other | Admitting: Podiatry

## 2014-11-18 DIAGNOSIS — K635 Polyp of colon: Secondary | ICD-10-CM | POA: Insufficient documentation

## 2014-11-18 DIAGNOSIS — K7460 Unspecified cirrhosis of liver: Secondary | ICD-10-CM | POA: Insufficient documentation

## 2014-11-22 ENCOUNTER — Ambulatory Visit
Admission: RE | Admit: 2014-11-22 | Discharge: 2014-11-22 | Disposition: A | Discharge status: Home / Self Care | Payer: Medicare Other | Source: Ambulatory Visit | Attending: Nurse Practitioner | Admitting: Nurse Practitioner

## 2014-11-22 DIAGNOSIS — K7460 Unspecified cirrhosis of liver: Secondary | ICD-10-CM | POA: Diagnosis not present

## 2014-11-22 DIAGNOSIS — R188 Other ascites: Secondary | ICD-10-CM

## 2014-11-29 ENCOUNTER — Ambulatory Visit: Payer: Medicare Other | Admitting: Podiatry

## 2014-12-13 ENCOUNTER — Encounter: Payer: Self-pay | Admitting: Podiatry

## 2014-12-13 ENCOUNTER — Ambulatory Visit (INDEPENDENT_AMBULATORY_CARE_PROVIDER_SITE_OTHER): Payer: Medicare Other | Admitting: Podiatry

## 2014-12-13 VITALS — BP 135/75 | HR 124 | Resp 18

## 2014-12-13 DIAGNOSIS — E1140 Type 2 diabetes mellitus with diabetic neuropathy, unspecified: Secondary | ICD-10-CM | POA: Diagnosis not present

## 2014-12-13 DIAGNOSIS — M79676 Pain in unspecified toe(s): Secondary | ICD-10-CM

## 2014-12-13 DIAGNOSIS — E1149 Type 2 diabetes mellitus with other diabetic neurological complication: Secondary | ICD-10-CM

## 2014-12-13 DIAGNOSIS — L84 Corns and callosities: Secondary | ICD-10-CM

## 2014-12-13 DIAGNOSIS — B351 Tinea unguium: Secondary | ICD-10-CM | POA: Diagnosis not present

## 2014-12-14 NOTE — Progress Notes (Signed)
Patient ID: [name], female   DOB: 1955/08/10, 59 y.o.   MRN: [id number]  Subjective: 59 y.o. returns the office today for painful, elongated, thickened toenails which she is unable to trim herself. Denies any redness or drainage around the nails. She states the wound on the right big toe is healed. After the wound heals she had a callus for which she states that she has been using over-the-counter corn remover. She denies any open sores this time. Denies any swelling or redness to her feet. Denies any acute changes since last appointment and no new complaints today. Denies any systemic complaints such as fevers, chills, nausea, vomiting.   Objective: AAO 3, NAD DP/PT pulses palpable, CRT less than 3 seconds Protective sensation decreased with Simms Weinstein monofilament Nails hypertrophic, dystrophic, elongated, brittle, discolored 8. There is tenderness overlying the nails 1-5 on the left and 1, 4, 5 on the right. There is no surrounding erythema or drainage along the nail sites. 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 Upon debridement no underlying ulceration, drainage or other signs of infection. There is no edema or erythema. There is hallux malleus present. No other open lesions or pre-ulcerative lesions are identified. No other areas of tenderness bilateral lower extremities. No overlying edema, erythema, increased warmth. No pain with calf compression, swelling, warmth, erythema.  Assessment: Patient presents with symptomatic onychomycosis; pre-ulcerative callus  Plan: -Treatment options including alternatives, risks, complications were discussed -Nails sharply debrided 8 without complication/bleeding. -Pre-ulcerative callus sharply debrided 1 without complication/bleeding. -Recommended not to use over-the-counter corn remover acid pads. -Discussed daily foot inspection. If there are any changes, to  call the office immediately.  -Follow-up in 3 months or sooner if any problems are to arise. In the meantime, encouraged to call the office with any questions, concerns, changes symptoms.  Celesta Gentile, DPM

## 2015-01-11 ENCOUNTER — Telehealth: Payer: Self-pay | Admitting: *Deleted

## 2015-01-11 NOTE — Telephone Encounter (Signed)
Called patient and left a message for her to call back with the correct medical doctor, this is for the diabetic shoe authorization and I have Dr Junie Panning MD. [name]

## 2015-03-14 ENCOUNTER — Ambulatory Visit: Payer: Medicare Other | Admitting: Podiatry

## 2015-03-14 ENCOUNTER — Ambulatory Visit: Payer: Medicare Other | Admitting: Sports Medicine

## 2015-03-28 ENCOUNTER — Ambulatory Visit: Payer: Medicare Other | Admitting: Sports Medicine

## 2015-04-11 ENCOUNTER — Ambulatory Visit: Payer: Medicare Other | Admitting: Sports Medicine

## 2015-04-18 ENCOUNTER — Encounter: Payer: Self-pay | Admitting: Sports Medicine

## 2015-04-18 ENCOUNTER — Ambulatory Visit (INDEPENDENT_AMBULATORY_CARE_PROVIDER_SITE_OTHER): Payer: Medicare Other | Admitting: Sports Medicine

## 2015-04-18 DIAGNOSIS — E11621 Type 2 diabetes mellitus with foot ulcer: Secondary | ICD-10-CM | POA: Diagnosis not present

## 2015-04-18 DIAGNOSIS — L97509 Non-pressure chronic ulcer of other part of unspecified foot with unspecified severity: Secondary | ICD-10-CM

## 2015-04-18 DIAGNOSIS — L89891 Pressure ulcer of other site, stage 1: Secondary | ICD-10-CM | POA: Diagnosis not present

## 2015-04-18 DIAGNOSIS — B351 Tinea unguium: Secondary | ICD-10-CM

## 2015-04-18 DIAGNOSIS — E1149 Type 2 diabetes mellitus with other diabetic neurological complication: Secondary | ICD-10-CM | POA: Diagnosis not present

## 2015-04-18 DIAGNOSIS — M79676 Pain in unspecified toe(s): Secondary | ICD-10-CM

## 2015-04-18 MED ORDER — AMOXICILLIN-POT CLAVULANATE 875-125 MG PO TABS: 1.0000 | ORAL_TABLET | Freq: Two times a day (BID) | ORAL | Status: DC

## 2015-04-18 NOTE — Progress Notes (Signed)
Patient ID: [name], female   DOB: [birth year], 60 y.o.   MRN: [id number] Subjective: [name] is a 60 y.o. female patient seen in office for routine diabetic foot care and for callus care. Admits to history of callus opening up before on right big toe. Patient has a history of diabetes and a blood glucose level today of 20m/dl.   Patient was diagnosed with diabetes >10 years ago,Denies nausea/fever/vomiting/chills/night sweats/shortness of breath/pain. Patient has no other pedal complaints at this time.  There are no active problems to display for this patient.  Current Outpatient Prescriptions on File Prior to Visit  Medication Sig Dispense Refill  . acetaminophen (TYLENOL) 500 MG tablet Take 500 mg by mouth every 6 (six) hours as needed.    .Marland KitchenbuPROPion (WELLBUTRIN XL) 300 MG 24 hr tablet Take 300 mg by mouth daily.    . cephALEXin (KEFLEX) 500 MG capsule Take 1 capsule (500 mg total) by mouth 3 (three) times daily. 30 capsule 2  . Cholecalciferol (VITAMIN D3) 1000 UNITS CAPS Take by mouth.    . citalopram (CELEXA) 40 MG tablet Take 40 mg by mouth daily.    .Marland Kitchenlactulose (CHRONULAC) 10 GM/15ML solution     . meloxicam (MOBIC) 15 MG tablet Take 15 mg by mouth daily.    . metFORMIN (GLUCOPHAGE) 500 MG tablet   1  . methocarbamol (ROBAXIN) 750 MG tablet Take 750 mg by mouth 3 (three) times daily.    . risperiDONE (RISPERDAL) 2 MG tablet Take 2 mg by mouth 2 (two) times daily.    .Marland Kitchenspironolactone (ALDACTONE) 25 MG tablet     . XIFAXAN 550 MG TABS tablet      No current facility-administered medications on file prior to visit.   Allergies  Allergen Reactions  . Vicodin [Hydrocodone-Acetaminophen] Other (See Comments) and Rash    nightmares nightmares Per patient she crawls all over the bed     Objective: General: Patient is awake, alert, oriented x 3 and in no acute distress.  Dermatology: Skin is warm and dry bilateral with a full thickness ulceration present  Right  hallux plantar medial aspect. Ulceration measures 2 cm x 1 cm x 0.4cm. There is a  keratotic border with a fibrogranular base. The ulceration does not   probe to bone. There is malodor, no active drainage, no erythema, no edema. No other acute signs of infection. Nails x 8 elongated, thickened, discolored and thickened with subungal debris consistent with onychomycosis   Vascular: Dorsalis Pedis pulse = 2/4 Bilateral,  Posterior Tibial pulse = 1/4 Bilateral,  Capillary Fill Time < 3 seconds  Neurologic: Epicritic sensation severely diminished plantar aspect of both feet using  the 5.07/10g [place] Vibratory absent bilateral.  Musculosketal: No Pain with palpation to ulcerated area. No pain with compression to calves bilateral. 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 and hallux malleolus on right Assessment and Plan:  Problem List Items Addressed This Visit    None    Visit Diagnoses    Dermatophytosis of nail    -  Primary    Diabetic foot ulcer associated with type 2 diabetes mellitus, unspecified laterality (HCC)        Right hallux    Relevant Medications    amoxicillin-clavulanate (AUGMENTIN) 875-125 MG tablet    Pain of toe, unspecified laterality        Type II diabetes mellitus with neurological manifestations ([place]          -  Examined patient  -Mechanically debrided nails x 8 using sterile nail nipper without incident -Discussed the progression of  Callus which is now a wound/has reopened and treatment alternatives. - Excisionally dedbrided ulceration to healthy bleeding borders using a sterile chisel  blade. -Applied silvadene cream and dry sterile dressing with offloading pad and instructed patient to continue with daily dressings at home consisting of antibiotic cream and bandaid/dry sterile dressing with offloading pad. -Rx Augmentin 867m because of malodor for preventative measures - Advised patient to go to the ER or return to office if the  wound worsens or if constitutional symptoms are present. -Patient to return to office in 1 week for follow up care and evaluation or sooner if problems arise. May consider repeat xray at next encounter if not improving.   TLandis Martins DPM

## 2015-04-26 ENCOUNTER — Other Ambulatory Visit: Payer: Self-pay | Admitting: Nurse Practitioner

## 2015-04-26 DIAGNOSIS — R188 Other ascites: Principal | ICD-10-CM

## 2015-04-26 DIAGNOSIS — K7460 Unspecified cirrhosis of liver: Secondary | ICD-10-CM

## 2015-04-28 ENCOUNTER — Ambulatory Visit: Payer: Medicare Other | Admitting: Sports Medicine

## 2015-05-03 ENCOUNTER — Other Ambulatory Visit: Payer: Self-pay | Admitting: Nurse Practitioner

## 2015-05-03 DIAGNOSIS — K7460 Unspecified cirrhosis of liver: Secondary | ICD-10-CM

## 2015-05-03 DIAGNOSIS — R0602 Shortness of breath: Secondary | ICD-10-CM

## 2015-05-03 DIAGNOSIS — R188 Other ascites: Principal | ICD-10-CM

## 2015-05-10 ENCOUNTER — Ambulatory Visit: Payer: Medicare Other

## 2015-05-10 ENCOUNTER — Ambulatory Visit
Admission: RE | Admit: 2015-05-10 | Discharge: 2015-05-10 | Disposition: A | Discharge status: Home / Self Care | Payer: Medicare Other | Source: Ambulatory Visit | Attending: Nurse Practitioner | Admitting: Nurse Practitioner

## 2015-05-10 DIAGNOSIS — E042 Nontoxic multinodular goiter: Secondary | ICD-10-CM | POA: Diagnosis not present

## 2015-05-10 DIAGNOSIS — R0602 Shortness of breath: Secondary | ICD-10-CM | POA: Insufficient documentation

## 2015-05-10 DIAGNOSIS — D259 Leiomyoma of uterus, unspecified: Secondary | ICD-10-CM | POA: Insufficient documentation

## 2015-05-10 DIAGNOSIS — R918 Other nonspecific abnormal finding of lung field: Secondary | ICD-10-CM | POA: Insufficient documentation

## 2015-05-10 DIAGNOSIS — K766 Portal hypertension: Secondary | ICD-10-CM | POA: Insufficient documentation

## 2015-05-10 DIAGNOSIS — R188 Other ascites: Secondary | ICD-10-CM | POA: Insufficient documentation

## 2015-05-10 DIAGNOSIS — R161 Splenomegaly, not elsewhere classified: Secondary | ICD-10-CM | POA: Diagnosis not present

## 2015-05-10 DIAGNOSIS — K7460 Unspecified cirrhosis of liver: Secondary | ICD-10-CM | POA: Insufficient documentation

## 2015-05-10 MED ORDER — IOHEXOL 350 MG/ML SOLN
100.0000 mL | Freq: Once | INTRAVENOUS | Status: AC | PRN
  Administered 2015-05-10: 100 mL via INTRAVENOUS

## 2015-08-04 ENCOUNTER — Other Ambulatory Visit: Payer: Self-pay | Admitting: Nurse Practitioner

## 2015-08-04 DIAGNOSIS — K7460 Unspecified cirrhosis of liver: Secondary | ICD-10-CM

## 2015-08-04 DIAGNOSIS — R0602 Shortness of breath: Secondary | ICD-10-CM

## 2015-08-04 DIAGNOSIS — R188 Other ascites: Principal | ICD-10-CM

## 2015-08-08 ENCOUNTER — Ambulatory Visit
Admission: RE | Admit: 2015-08-08 | Discharge: 2015-08-08 | Disposition: A | Discharge status: Home / Self Care | Payer: Medicare Other | Source: Ambulatory Visit | Attending: Nurse Practitioner | Admitting: Nurse Practitioner

## 2015-08-08 ENCOUNTER — Other Ambulatory Visit: Payer: Self-pay | Admitting: Nurse Practitioner

## 2015-08-08 DIAGNOSIS — R188 Other ascites: Principal | ICD-10-CM

## 2015-08-08 DIAGNOSIS — R0602 Shortness of breath: Secondary | ICD-10-CM

## 2015-08-08 DIAGNOSIS — K7460 Unspecified cirrhosis of liver: Secondary | ICD-10-CM

## 2015-08-11 ENCOUNTER — Encounter: Payer: Self-pay | Admitting: Sports Medicine

## 2015-08-11 ENCOUNTER — Ambulatory Visit (INDEPENDENT_AMBULATORY_CARE_PROVIDER_SITE_OTHER): Payer: Medicare Other | Admitting: Sports Medicine

## 2015-08-11 DIAGNOSIS — L84 Corns and callosities: Secondary | ICD-10-CM | POA: Diagnosis not present

## 2015-08-11 DIAGNOSIS — I739 Peripheral vascular disease, unspecified: Secondary | ICD-10-CM

## 2015-08-11 DIAGNOSIS — E1140 Type 2 diabetes mellitus with diabetic neuropathy, unspecified: Secondary | ICD-10-CM | POA: Diagnosis not present

## 2015-08-11 DIAGNOSIS — M79676 Pain in unspecified toe(s): Secondary | ICD-10-CM | POA: Diagnosis not present

## 2015-08-11 DIAGNOSIS — B351 Tinea unguium: Secondary | ICD-10-CM

## 2015-08-11 NOTE — Progress Notes (Signed)
Patient ID: Aalani Aikens, female   DOB: 07/08/1955, 60 y.o.   MRN: [id number] Subjective: Shalinda Burkholder is a 60 y.o. female patient with history of diabetes who presents to office today complaining of long, painful nails  while ambulating in shoes; unable to trim. Patient states that the glucose reading this morning was not recorded but her A1c was good. Patient denies any new changes in medication or new problems. Patient denies any new cramping, numbness, burning or tingling in the legs. Patient reports she is to go for echocardiogram on today.  Patient Active Problem List   Diagnosis Date Noted  . Hepatic cirrhosis ([place]) 11/18/2014  . Colon polyp 11/18/2014   Current Outpatient Prescriptions on File Prior to Visit  Medication Sig Dispense Refill  . acetaminophen (TYLENOL) 500 MG tablet Take 500 mg by mouth every 6 (six) hours as needed.    Marland Kitchen amoxicillin-clavulanate (AUGMENTIN) 875-125 MG tablet Take 1 tablet by mouth 2 (two) times daily. 20 tablet 0  . buPROPion (WELLBUTRIN XL) 300 MG 24 hr tablet Take 300 mg by mouth daily.    . cephALEXin (KEFLEX) 500 MG capsule Take 1 capsule (500 mg total) by mouth 3 (three) times daily. 30 capsule 2  . Cholecalciferol (VITAMIN D3) 1000 UNITS CAPS Take by mouth.    . citalopram (CELEXA) 40 MG tablet Take 40 mg by mouth daily.    Marland Kitchen lactulose (CHRONULAC) 10 GM/15ML solution     . meloxicam (MOBIC) 15 MG tablet Take 15 mg by mouth daily.    . metFORMIN (GLUCOPHAGE) 500 MG tablet   1  . methocarbamol (ROBAXIN) 750 MG tablet Take 750 mg by mouth 3 (three) times daily.    . risperiDONE (RISPERDAL) 2 MG tablet Take 2 mg by mouth 2 (two) times daily.    Marland Kitchen spironolactone (ALDACTONE) 25 MG tablet     . XIFAXAN 550 MG TABS tablet      No current facility-administered medications on file prior to visit.   Allergies  Allergen Reactions  . Vicodin [Hydrocodone-Acetaminophen] Other (See Comments) and Rash    nightmares nightmares Per patient she crawls  all over the bed    No results found for this or any previous visit (from the past 2160 hour(s)).  Objective: General: Patient is awake, alert, and oriented x 3 and in no acute distress.  Integument: Skin is warm, dry and supple bilateral. Nails are tender, long, thickened and  dystrophic with subungual debris, consistent with onychomycosis, x8. No signs of infection. No open lesions or preulcerative lesions present bilateral. Mild callus at the medial aspect of the right hallux with no acute signs of infection. Remaining integument unremarkable.  Vasculature:  Dorsalis Pedis pulse 1/4 bilateral. Posterior Tibial pulse  1/4 bilateral.  Capillary fill time <3 sec 1-5 bilateral. No  hair growth to the level of the digits. Temperature gradient within normal limits. Mild varicosities present bilateral. 2+ pitting edema present bilateral.   Neurology: The patient has diminished sensation measured with a 5.07/10g Semmes Weinstein Monofilament at all pedal sites bilateral . Vibratory sensation absent bilateral with tuning fork. No Babinski sign present bilateral.   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 Muscular strength 5/5 in all lower extremity muscular groups bilateral without pain on range of motion . No tenderness with calf compression bilateral.  Assessment and Plan: Problem List Items Addressed This Visit    None    Visit Diagnoses    Dermatophytosis of nail    -  Primary    Pain of toe, unspecified laterality        Type 2 diabetes mellitus with diabetic neuropathy, unspecified long term insulin use status (HCC)        PVD (peripheral vascular disease) ([place])           -Examined patient. -Discussed and educated patient on diabetic foot care, especially with  regards to the vascular, neurological and musculoskeletal systems.  -Stressed the importance of good glycemic control and the detriment of not  controlling glucose  levels in relation to the foot. -Mechanically debrided Callus 1 using sterile chisel blade at right hallux without incident and debrided all nails 8 bilateral using sterile nail nipper and filed with dremel without incident  -Recommend elevation of lower legs to assist with edema control and follow-up primary care doctor on continued management in the setting of other comorbidities -Answered all patient questions -Patient to return  in 3 months for at risk foot care -Patient advised to call the office if any problems or questions arise in the meantime.  Landis Martins, DPM

## 2015-09-12 ENCOUNTER — Encounter: Payer: Medicare Other | Attending: Nurse Practitioner | Admitting: Dietician

## 2015-09-12 ENCOUNTER — Encounter: Payer: Self-pay | Admitting: Dietician

## 2015-09-12 VITALS — Ht 65.0 in | Wt 258.4 lb

## 2015-09-12 DIAGNOSIS — E669 Obesity, unspecified: Secondary | ICD-10-CM | POA: Diagnosis present

## 2015-09-12 DIAGNOSIS — K7460 Unspecified cirrhosis of liver: Secondary | ICD-10-CM | POA: Insufficient documentation

## 2015-09-12 DIAGNOSIS — E119 Type 2 diabetes mellitus without complications: Secondary | ICD-10-CM | POA: Insufficient documentation

## 2015-09-12 DIAGNOSIS — K7469 Other cirrhosis of liver: Secondary | ICD-10-CM

## 2015-09-12 NOTE — Patient Instructions (Signed)
Balance meals with protein (1-3 oz.), 2-3 carbohydrate servings (starch, fruit, yogurt) and "free vegetables". Include 4-5 oz. of a protein (refer to list) along with protein in 3-4 (8 oz) servings of yogurt. Include more "free", non-starchy vegetables to help increase fiber and nutrients. Try variety of steamed vegetables.  Decrease regular sodas. Read labels for sodium. Limit to no more than 500 mg sodium per meal. Recommendation is to keep less than 1531m/day. Refer to list of low sodium products to substitute for the higher sodium products.

## 2015-09-12 NOTE — Progress Notes (Signed)
Medical Nutrition Therapy: Visit start time: 5009 end time:1530 Assessment:  Diagnosis: obesity, Type 2 diabetes, cirrhosis Psychosocial issues/ stress concerns: Patient rates her stress as moderate and indicates "ok" as to how well she is dealing with her stress. Preferred learning method:  . Hands-on Current weight: 258.4 lbs  Height: 65in Medications, supplements: see list Progress and evaluation:  Patient came for initial medical nutrition therapy visit. When asked what she hoped to get from this visit, she stated, "To please my doctor". She reports a 10 year history of diabetes; does not monitor glucose at home. She also reports a 3 year history of cirrhosis. She reports she has a poor appetite. She eats lite yogurt with every meal and as snacks-averages 5 cups of yogurt daily. She does not add salt to meals. Does very little food preparation. Her diet is low in fruits/vegetables, fiber and whole grains. Her yogurt intake is helping to provide adequate protein on most days.She drinks at least 8 cups of water per day and averages 24 oz. of regular Sprite daily.  She states she would be willing to prepare simple meals but cannot go to the grocery store. She states her daughter may be able to help her with going to pick up her groceries. She eats out at least 5 days per week Physical activity: none  Dietary Intake:  Usual eating pattern includes 3 meals and 1-2 snacks per day. Dining out frequency: 5 meals per week.  Breakfast: banana and yogurt, water Lunch: ham sandwich, yogurt, water Supper: 6:00pm- noodle/vegetable dinner; doesn't use all of the seasoning pack, yogurt or pork chop, potato Snack: yogurt and then ice cream later in the evening Beverages: water, Sprite (24 oz), 8-10 oz. juice  Nutrition Care Education: Diabetes:  Instructed on a meal plan showing with food guide plate and food models how to better balance carbohydrate, protein and non-starchy vegetables. Discussed simple  meals that require very little food preparation. Instructed on carbohydrate foods and portion control. Gave and reviewed sample menu. Cirrhosis: Discussed how diet for diabetes is a healthy diet for liver disease as well. Focused more on sodium recommendation related to ascites and gave and reviewed lower sodium products to substitute for higher sodium. Also, discussed protein so as to meet basic needs but not to exceed recommended amounts. Discussed how fat can be harder to digest when there is liver damage.  Nutritional Diagnosis:  NI-5.11.1 Predicted suboptimal nutrient intake As related to low intake of fruits/vegetables and whole grains.  As evidenced by diet history..  Intervention:  Balance meals with protein (1-3 oz.), 2-3 carbohydrate servings (starch, fruit, yogurt) and "free vegetables". Include 4-5 oz. of a protein (refer to list) along with protein in 3-4 (8 oz) servings of yogurt. Include more "free", non-starchy vegetables to help increase fiber and nutrients. Try variety of steamed vegetables.  Decrease regular sodas. Read labels for sodium. Limit to no more than 500 mg sodium per meal. Recommendation is to keep less than 1549m/day. Refer to list of low sodium products to substitute for the higher sodium products.  Education Materials given:  . Food lists/ Planning A Balanced Meal . Sample meal pattern/ menus . List of lower sodium products to substitute . Goals/ instructions Learner/ who was taught:  . Patient  Level of understanding: . Partial understanding; needs review/ practice Learning barriers: . None  Willingness to learn/ readiness for change: . Hesitance, contemplating change Monitoring and Evaluation:  No follow-up scheduled. Patient was encouraged to call if desires further  help with her diet/nutrition.

## 2015-09-21 ENCOUNTER — Encounter (INDEPENDENT_AMBULATORY_CARE_PROVIDER_SITE_OTHER): Payer: Self-pay

## 2015-09-21 ENCOUNTER — Ambulatory Visit: Payer: Medicare Other | Attending: Pain Medicine | Admitting: Pain Medicine

## 2015-09-21 ENCOUNTER — Encounter: Payer: Self-pay | Admitting: Pain Medicine

## 2015-09-21 VITALS — BP 107/58 | HR 57 | Temp 98.3°F | Resp 17 | Ht 65.0 in | Wt 257.0 lb

## 2015-09-21 DIAGNOSIS — F329 Major depressive disorder, single episode, unspecified: Secondary | ICD-10-CM | POA: Diagnosis not present

## 2015-09-21 DIAGNOSIS — M5030 Other cervical disc degeneration, unspecified cervical region: Secondary | ICD-10-CM | POA: Diagnosis not present

## 2015-09-21 DIAGNOSIS — M179 Osteoarthritis of knee, unspecified: Secondary | ICD-10-CM | POA: Insufficient documentation

## 2015-09-21 DIAGNOSIS — K589 Irritable bowel syndrome without diarrhea: Secondary | ICD-10-CM | POA: Diagnosis not present

## 2015-09-21 DIAGNOSIS — K0889 Other specified disorders of teeth and supporting structures: Secondary | ICD-10-CM | POA: Diagnosis not present

## 2015-09-21 DIAGNOSIS — E1142 Type 2 diabetes mellitus with diabetic polyneuropathy: Secondary | ICD-10-CM | POA: Diagnosis not present

## 2015-09-21 DIAGNOSIS — M5136 Other intervertebral disc degeneration, lumbar region: Secondary | ICD-10-CM | POA: Insufficient documentation

## 2015-09-21 DIAGNOSIS — M160 Bilateral primary osteoarthritis of hip: Secondary | ICD-10-CM | POA: Insufficient documentation

## 2015-09-21 DIAGNOSIS — M533 Sacrococcygeal disorders, not elsewhere classified: Secondary | ICD-10-CM | POA: Insufficient documentation

## 2015-09-21 DIAGNOSIS — M797 Fibromyalgia: Secondary | ICD-10-CM | POA: Diagnosis not present

## 2015-09-21 DIAGNOSIS — K7460 Unspecified cirrhosis of liver: Secondary | ICD-10-CM | POA: Diagnosis not present

## 2015-09-21 DIAGNOSIS — R600 Localized edema: Secondary | ICD-10-CM | POA: Diagnosis not present

## 2015-09-21 DIAGNOSIS — K745 Biliary cirrhosis, unspecified: Secondary | ICD-10-CM

## 2015-09-21 DIAGNOSIS — M170 Bilateral primary osteoarthritis of knee: Secondary | ICD-10-CM | POA: Diagnosis not present

## 2015-09-21 DIAGNOSIS — M109 Gout, unspecified: Secondary | ICD-10-CM | POA: Diagnosis not present

## 2015-09-21 DIAGNOSIS — M1710 Unilateral primary osteoarthritis, unspecified knee: Secondary | ICD-10-CM | POA: Insufficient documentation

## 2015-09-21 DIAGNOSIS — Z9889 Other specified postprocedural states: Secondary | ICD-10-CM | POA: Diagnosis not present

## 2015-09-21 DIAGNOSIS — E119 Type 2 diabetes mellitus without complications: Secondary | ICD-10-CM | POA: Insufficient documentation

## 2015-09-21 NOTE — Patient Instructions (Addendum)
PLAN  Continue present medications  F/U PCP at [facility]  for evaluation of  BP and general medical condition Please discuss referral to rheumatologist and to [place] or pain clinic at [facility] or [place] for further evaluation of your condition

## 2015-09-21 NOTE — Progress Notes (Signed)
Subjective:    Patient ID: [name], female    DOB: Aug 20, 1955, 60 y.o.   MRN: [id number]  HPI  The patient is a 60 year old female who comes to pain management at the request of PCP Elisabeth Cara for further evaluation and treatment of pain involving multiple regions. The patient states that she has diagnoses of fibromyalgia and has pain involving the region of the back hips lower extremities feet knees and other regions. The patient also has diagnoses of diabetes mellitus with polyneuropathy gout degenerative disc disease of the cervical spine degenerative disc disease of the lumbar spine peripheral edema irritable bowel syndrome cirrhosis of the liver depression and other conditions. On today's visit the patient stated that she had pain involving the region from the neck to the feet. The patient described her pain as aching and annoying disabling getting longer nagging toothache-like uncomfortable sensation associated with weakness fatigue. The patient stated the pain increased with bending climbing kneeling lifting motion standing squatting stooping walking working. The patient stated the pain decreased with resting sleeping and lying down. The patient is undergone prior surgery of the cervical region as well as surgery of the knee due to torn meniscus surgery of the right foot with partial removal of toe (second and third toes). We discussed patient's condition on today's visit and patient was observed to have significant lower extremity edema. We informed patient that we would prefer to avoid interventional treatment as well as modification of medications due to patient's general medical condition. We have advised patient to return to her referring physician Elisabeth Cara to discuss referral to a tertiary pain clinic at [facility] to, [place], or [facility]. We informed patient that we would remain available to consider patient for modifications of treatment regimen pending recommendations of  the tertiary pain clinic. The patient will further address this with her primary care physician and we will remain available to consider patient for additional modifications of treatment pending follow-up evaluations. All agreed to suggested treatment plan.  Review of Systems    Cardiovascular: Unremarkable   Pulmonary: Unremarkable  Neurological: Peripheral neuropathy  Psychological: Depression  Gastrointestinal: Unremarkable  Genitourinary: Unremarkable  Hematologic: Unremarkable  Endocrine: Diabetes mellitus  Rheumatological: Unremarkable  Musculoskeletal: Unremarkable  Other significant: Unremarkable      Objective:   Physical Exam   There was tenderness to palpation of the paraspinal musculature and cervical region cervical facet region a moderate degree with moderate tenderness of the splenius capitis and occipitalis regions. No masses of the head and neck were noted. There appeared to be unremarkable Spurling's maneuver. The patient appeared to be with slightly decreased grip strength with Tinel and Phalen's maneuver reproducing moderate discomfort. Palpation of the acromioclavicular and glenohumeral joint regions reproduce moderate discomfort with moderate muscle spasms noted in the upper mid and lower thoracic regions. Palpation over the lumbar paraspinal musculatures and lumbar facet region was attends to palpation of moderate to moderately severe degree with lateral bending rotation extension and palpation over the lumbar facets reproducing moderate to moderately severe discomfort as well. There was moderate tenderness of the greater trochanteric region iliotibial band region. Straight leg raise was tolerates approximately 20 without increased pain with dorsiflexion noted. There was tenderness to palpation of the knees with negative anterior and posterior drawer signs without ballottement of the patella there was well-healed scar of the knee without increased  warmth and erythema in the region of the knee. There was crepitus of the knees and no ballottement of the  patella noted. Patient was with lower extremity edema with no increased warmth and erythema in the region of the lower extremities. There was tends to palpation of the lower extremities without excessive tends to palpation of the lower extremities noted. No sensory deficit of dermatomal distribution was detected. Palpation over the PSIS and PII S regions reproduce moderate to moderately severe discomfort. There was negative clonus negative Homans. Abdomen was nontender with no costovertebral tenderness noted     Assessment & Plan:   Fibromyalgia  Degenerative disc disease cervical spine  Post cervical surgery syndrome  Cervical facet syndrome  Degenerative disc disease lumbar spine  Lumbar facet syndrome  Sacroiliac joint dysfunction  Diabetic neuropathy  DJD (hips knees)  Depression     PLAN  Continue present medications  F/U PCP at [facility]  for evaluation of  BP and general medical condition Please discuss referral to rheumatologist and to [place] or pain clinic at [facility] or [place] for further evaluation of your condition Recommended rheumatological evaluation as well Will remain available to offer additional recommendations as needed

## 2015-09-27 ENCOUNTER — Other Ambulatory Visit: Payer: Self-pay | Admitting: Gastroenterology

## 2015-09-27 DIAGNOSIS — K7460 Unspecified cirrhosis of liver: Secondary | ICD-10-CM

## 2015-09-29 ENCOUNTER — Ambulatory Visit
Admission: RE | Admit: 2015-09-29 | Discharge: 2015-09-29 | Disposition: A | Discharge status: Home / Self Care | Payer: Medicare Other | Source: Ambulatory Visit | Attending: Gastroenterology | Admitting: Gastroenterology

## 2015-09-29 DIAGNOSIS — R188 Other ascites: Secondary | ICD-10-CM | POA: Insufficient documentation

## 2015-09-29 DIAGNOSIS — Z9049 Acquired absence of other specified parts of digestive tract: Secondary | ICD-10-CM | POA: Insufficient documentation

## 2015-09-29 DIAGNOSIS — K7460 Unspecified cirrhosis of liver: Secondary | ICD-10-CM | POA: Diagnosis present

## 2015-09-29 DIAGNOSIS — R161 Splenomegaly, not elsewhere classified: Secondary | ICD-10-CM | POA: Insufficient documentation

## 2015-10-13 ENCOUNTER — Ambulatory Visit: Payer: Medicare Other | Attending: Specialist

## 2015-10-13 DIAGNOSIS — R600 Localized edema: Secondary | ICD-10-CM | POA: Insufficient documentation

## 2015-10-13 DIAGNOSIS — E669 Obesity, unspecified: Secondary | ICD-10-CM | POA: Insufficient documentation

## 2015-10-13 DIAGNOSIS — R0609 Other forms of dyspnea: Secondary | ICD-10-CM | POA: Diagnosis not present

## 2015-10-13 DIAGNOSIS — G4719 Other hypersomnia: Secondary | ICD-10-CM | POA: Diagnosis not present

## 2015-10-13 DIAGNOSIS — J9811 Atelectasis: Secondary | ICD-10-CM | POA: Insufficient documentation

## 2015-10-13 DIAGNOSIS — F419 Anxiety disorder, unspecified: Secondary | ICD-10-CM | POA: Diagnosis not present

## 2015-10-13 DIAGNOSIS — G4730 Sleep apnea, unspecified: Secondary | ICD-10-CM | POA: Insufficient documentation

## 2015-11-17 ENCOUNTER — Ambulatory Visit (INDEPENDENT_AMBULATORY_CARE_PROVIDER_SITE_OTHER): Payer: Medicare Other | Admitting: Sports Medicine

## 2015-11-17 ENCOUNTER — Encounter: Payer: Self-pay | Admitting: Sports Medicine

## 2015-11-17 DIAGNOSIS — I739 Peripheral vascular disease, unspecified: Secondary | ICD-10-CM | POA: Diagnosis not present

## 2015-11-17 DIAGNOSIS — L84 Corns and callosities: Secondary | ICD-10-CM | POA: Diagnosis not present

## 2015-11-17 DIAGNOSIS — B351 Tinea unguium: Secondary | ICD-10-CM | POA: Diagnosis not present

## 2015-11-17 DIAGNOSIS — M79676 Pain in unspecified toe(s): Secondary | ICD-10-CM

## 2015-11-17 DIAGNOSIS — E1140 Type 2 diabetes mellitus with diabetic neuropathy, unspecified: Secondary | ICD-10-CM

## 2015-11-17 NOTE — Progress Notes (Signed)
Patient ID: Deardra Hinkley, female   DOB: Aug 18, 1955, 60 y.o.   MRN: [id number] Subjective: Clarice Zulauf is a 60 y.o. female patient with history of diabetes who presents to office today complaining of callus and long, painful nails while ambulating in shoes; unable to trim. Patient states that the glucose reading this morning was not recorded but her A1c was good. Patient denies any new changes in medication or new problems. Patient denies any new cramping, numbness, burning or tingling in the legs.   Patient Active Problem List   Diagnosis Date Noted  . DJD (degenerative joint disease) of knee 09/21/2015  . Diabetes mellitus ([place]) 09/21/2015  . Fibromyalgia 09/21/2015  . Cirrhosis of liver ([place]) 09/21/2015  . Hepatic cirrhosis ([place]) 11/18/2014  . Colon polyp 11/18/2014   Current Outpatient Prescriptions on File Prior to Visit  Medication Sig Dispense Refill  . acetaminophen (TYLENOL) 500 MG tablet Take 500 mg by mouth every 6 (six) hours as needed.    Marland Kitchen amoxicillin-clavulanate (AUGMENTIN) 875-125 MG tablet Take 1 tablet by mouth 2 (two) times daily. (Patient not taking: Reported on 09/12/2015) 20 tablet 0  . buPROPion (WELLBUTRIN XL) 300 MG 24 hr tablet Take 300 mg by mouth daily.    . cephALEXin (KEFLEX) 500 MG capsule Take 1 capsule (500 mg total) by mouth 3 (three) times daily. (Patient not taking: Reported on 09/12/2015) 30 capsule 2  . Cholecalciferol (VITAMIN D3) 1000 UNITS CAPS Take by mouth.    . citalopram (CELEXA) 40 MG tablet Take 40 mg by mouth daily.    Marland Kitchen lactulose (CHRONULAC) 10 GM/15ML solution     . meloxicam (MOBIC) 15 MG tablet Take 15 mg by mouth daily.    . metFORMIN (GLUCOPHAGE) 500 MG tablet   1  . methocarbamol (ROBAXIN) 750 MG tablet Take 750 mg by mouth 3 (three) times daily.    . nadolol (CORGARD) 20 MG tablet     . risperiDONE (RISPERDAL) 2 MG tablet Take 2 mg by mouth 2 (two) times daily.    Marland Kitchen spironolactone (ALDACTONE) 25 MG tablet     . XIFAXAN 550 MG  TABS tablet      No current facility-administered medications on file prior to visit.    Allergies  Allergen Reactions  . Vicodin [Hydrocodone-Acetaminophen] Other (See Comments) and Rash    nightmares nightmares Per patient she crawls all over the bed    No results found for this or any previous visit (from the past 2160 hour(s)).  Objective: General: Patient is awake, alert, and oriented x 3 and in no acute distress.  Integument: Skin is warm, dry and supple bilateral. Nails are tender, long, thickened and dystrophic with subungual debris, consistent with onychomycosis, x8. No signs of infection. . Mild callus at the medial aspect of the right hallux and left plantar midfoot with no acute signs of infection. Remaining integument unremarkable.  Vasculature:  Dorsalis Pedis pulse 1/4 bilateral. Posterior Tibial pulse 0/4 bilateral. Capillary fill time <3 sec 1-5 bilateral. No  hair growth to the level of the digits.Temperature gradient within normal limits. + varicosities present bilateral. 2+ pitting edema present bilateral.   Neurology: The patient has diminished sensation measured with a 5.07/10g Semmes Weinstein Monofilament at all pedal sites bilateral . Vibratory sensation absent bilateral with tuning fork. No Babinski sign present bilateral.   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 Muscular strength 5/5 in all lower extremity muscular groups bilateral without  pain on range of motion . No tenderness with calf compression bilateral.  Assessment and Plan: Problem List Items Addressed This Visit    None    Visit Diagnoses    Dermatophytosis of nail    -  Primary   Pain of toe, unspecified laterality       Type 2 diabetes mellitus with diabetic neuropathy, unspecified long term insulin use status ([place])       PVD (peripheral vascular disease) ([place])       Callus of foot         -Examined  patient. -Discussed and educated patient on diabetic foot care, especially with  regards to the vascular, neurological and musculoskeletal systems.  -Stressed the importance of good glycemic control and the detriment of not  controlling glucose levels in relation to the foot. -Mechanically debrided Callus 2 using sterile chisel blade at right hallux without incident and debrided all nails 8 bilateral using sterile nail nipper and filed with dremel without incident  -Recommend elevation of lower legs to assist with edema control and follow-up primary care doctor on continued management in the setting of other comorbidities -Recommend do NOT walk barefoot -Answered all patient questions -Patient to return  in 3 months for at risk foot care -Patient advised to call the office if any problems or questions arise in the meantime.  Landis Martins, DPM

## 2016-02-09 ENCOUNTER — Ambulatory Visit: Payer: Medicare Other | Admitting: Podiatry

## 2016-03-21 ENCOUNTER — Ambulatory Visit: Payer: Medicare Other | Admitting: Podiatry

## 2016-03-22 ENCOUNTER — Ambulatory Visit: Payer: Medicare Other | Admitting: Podiatry

## 2016-04-18 ENCOUNTER — Ambulatory Visit: Payer: Medicare Other | Admitting: Podiatry

## 2016-05-03 ENCOUNTER — Ambulatory Visit: Payer: Medicare Other | Admitting: Podiatry

## 2016-05-10 ENCOUNTER — Ambulatory Visit: Payer: Medicare Other | Admitting: Podiatry

## 2016-05-17 ENCOUNTER — Ambulatory Visit: Payer: Medicare Other | Admitting: Podiatry

## 2016-05-24 ENCOUNTER — Ambulatory Visit: Payer: Medicare Other | Admitting: Podiatry

## 2016-06-04 ENCOUNTER — Ambulatory Visit: Payer: Medicare Other | Admitting: Podiatry

## 2016-06-14 ENCOUNTER — Ambulatory Visit: Payer: Medicare Other | Admitting: Podiatry

## 2016-07-05 ENCOUNTER — Ambulatory Visit: Payer: Medicare Other | Admitting: Podiatry

## 2016-07-25 ENCOUNTER — Other Ambulatory Visit: Payer: Self-pay | Admitting: Gastroenterology

## 2016-07-25 DIAGNOSIS — K7460 Unspecified cirrhosis of liver: Secondary | ICD-10-CM

## 2016-07-25 DIAGNOSIS — R188 Other ascites: Principal | ICD-10-CM

## 2016-07-26 ENCOUNTER — Ambulatory Visit (INDEPENDENT_AMBULATORY_CARE_PROVIDER_SITE_OTHER): Payer: Medicare Other | Admitting: Podiatry

## 2016-07-26 DIAGNOSIS — L603 Nail dystrophy: Secondary | ICD-10-CM | POA: Diagnosis not present

## 2016-07-26 DIAGNOSIS — M79671 Pain in right foot: Secondary | ICD-10-CM

## 2016-07-26 DIAGNOSIS — L608 Other nail disorders: Secondary | ICD-10-CM

## 2016-07-26 DIAGNOSIS — L851 Acquired keratosis [keratoderma] palmaris et plantaris: Secondary | ICD-10-CM

## 2016-07-26 DIAGNOSIS — B351 Tinea unguium: Secondary | ICD-10-CM | POA: Diagnosis not present

## 2016-07-26 DIAGNOSIS — M79672 Pain in left foot: Secondary | ICD-10-CM | POA: Diagnosis not present

## 2016-07-26 DIAGNOSIS — L84 Corns and callosities: Secondary | ICD-10-CM | POA: Diagnosis not present

## 2016-07-26 DIAGNOSIS — M79609 Pain in unspecified limb: Secondary | ICD-10-CM

## 2016-07-26 DIAGNOSIS — E0842 Diabetes mellitus due to underlying condition with diabetic polyneuropathy: Secondary | ICD-10-CM

## 2016-07-28 NOTE — Progress Notes (Signed)
   SUBJECTIVE Patient with a history of diabetes mellitus presents to office today complaining of elongated, thickened nails. Pain while ambulating in shoes. Patient is unable to trim their own nails.   OBJECTIVE General Patient is awake, alert, and oriented x 3 and in no acute distress. Derm Skin is dry and supple bilateral. Negative open lesions or macerations. Remaining integument unremarkable. Nails are tender, long, thickened and dystrophic with subungual debris, consistent with onychomycosis, 1-5 bilateral. No signs of infection noted. Vasc  DP and PT pedal pulses palpable bilaterally. Temperature gradient within normal limits.  Neuro Epicritic and protective threshold sensation diminished bilaterally.  Musculoskeletal Exam No symptomatic pedal deformities noted bilateral. Muscular strength within normal limits.  ASSESSMENT 1. Diabetes Mellitus w/ peripheral neuropathy 2. Onychomycosis of nail due to dermatophyte bilateral 3. Pain in foot bilateral 4. Callus right great toe  PLAN OF CARE 1. Patient evaluated today. 2. Instructed to maintain good pedal hygiene and foot care. Stressed importance of controlling blood sugar.  3. Mechanical debridement of nails 1-5 bilaterally performed using a nail nipper. Filed with dremel without incident.  4. Excisional debridement of callus lesion to the right great toe was performed using a chisel blade without incident or bleeding.  5. Return to clinic in 3 mos.     Edrick Kins, DPM [facility]  Dr. Edrick Kins, [address]                                        [place], [place] [address]                Office [phone]  Fax [phone]

## 2016-08-01 ENCOUNTER — Ambulatory Visit
Admission: RE | Admit: 2016-08-01 | Discharge: 2016-08-01 | Disposition: A | Discharge status: Home / Self Care | Payer: Medicare Other | Source: Ambulatory Visit | Attending: Gastroenterology | Admitting: Gastroenterology

## 2016-08-01 DIAGNOSIS — R188 Other ascites: Secondary | ICD-10-CM | POA: Insufficient documentation

## 2016-08-01 DIAGNOSIS — K7460 Unspecified cirrhosis of liver: Secondary | ICD-10-CM | POA: Diagnosis not present

## 2016-09-23 IMAGING — CT CT CHEST W/ CM
2 of 4 series · 15 of 31 positions shown, 18 images · IV contrast (APPLIED)
Comparison: Chest CT on 08/10/2014 and AP CT on 05/11/2013

CLINICAL DATA: Hepatic cirrhosis. Ascites. Worsening right upper
quadrant pain for 1 month.

EXAM:
CT CHEST, ABDOMEN, AND PELVIS WITH CONTRAST
TECHNIQUE: Multidetector CT imaging of the chest, abdomen and pelvis was
performed following the standard protocol during bolus
administration of intravenous contrast.
CONTRAST:  100mL OMNIPAQUE IOHEXOL 350 MG/ML SOLN

[Series 2: cap with 2 · axial · 0.70mm/px · z∈[-930,-425]mm · 9 of 131 slices shown, 12 images]
[im 15/131  mediastinal]
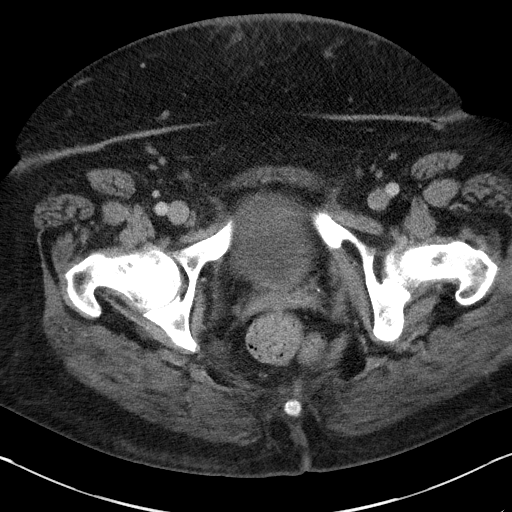
[im 15/131  lung]
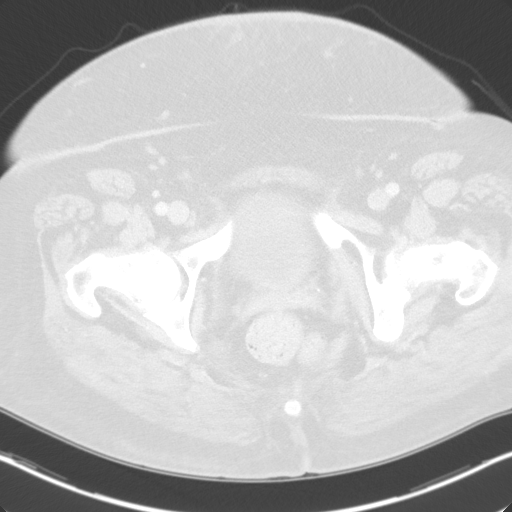
[im 29/131  lung]
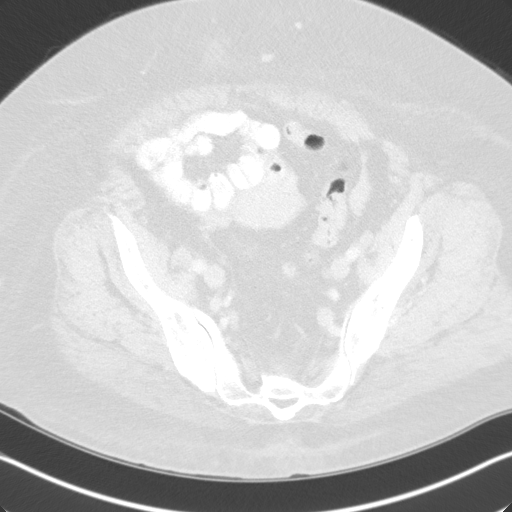
[im 44/131  lung]
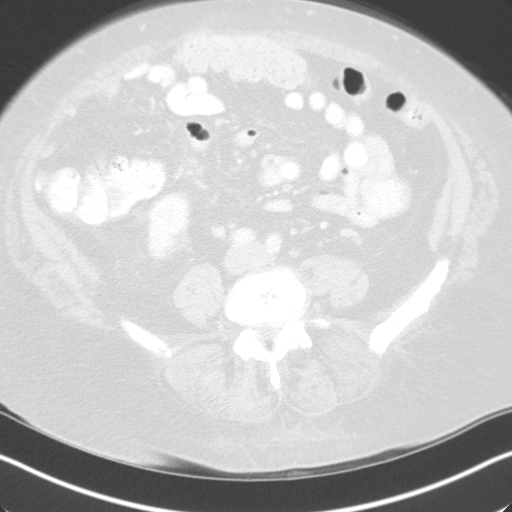
[im 58/131  lung]
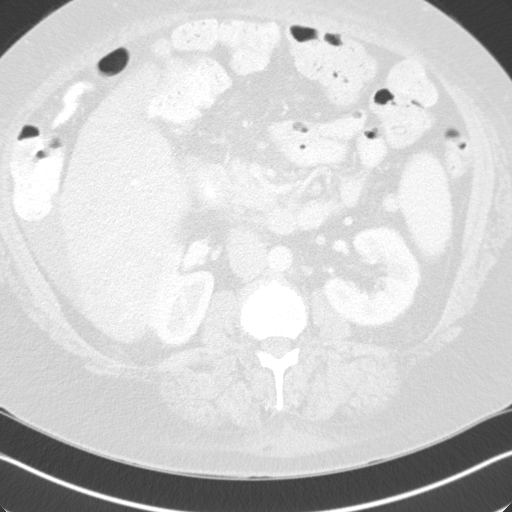
[im 64/131  mediastinal]
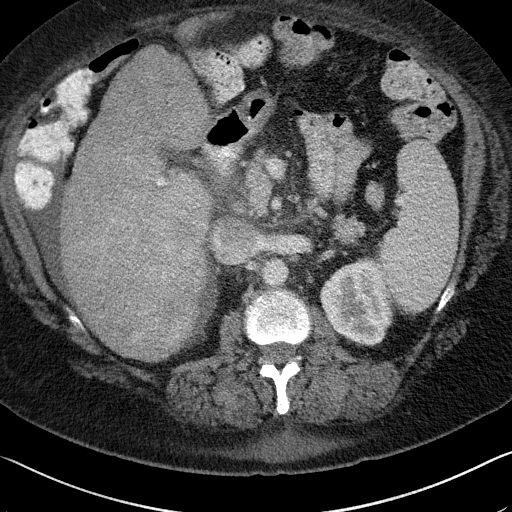
[im 64/131  lung]
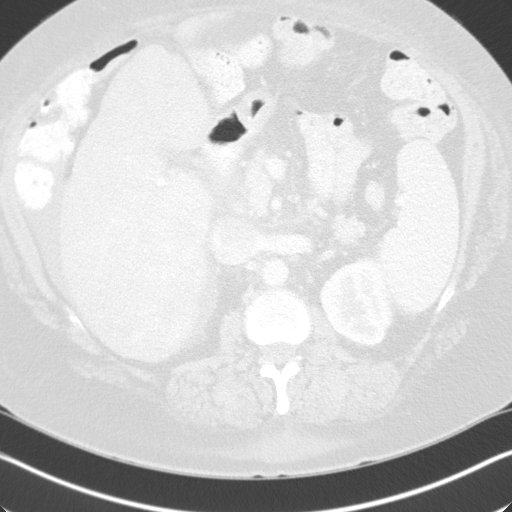
[im 73/131  lung]
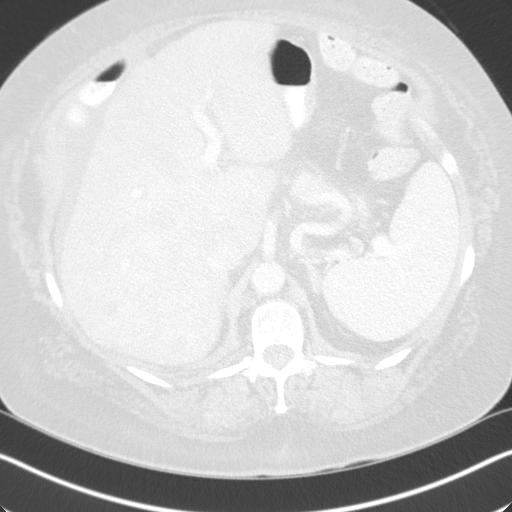
[im 87/131  lung]
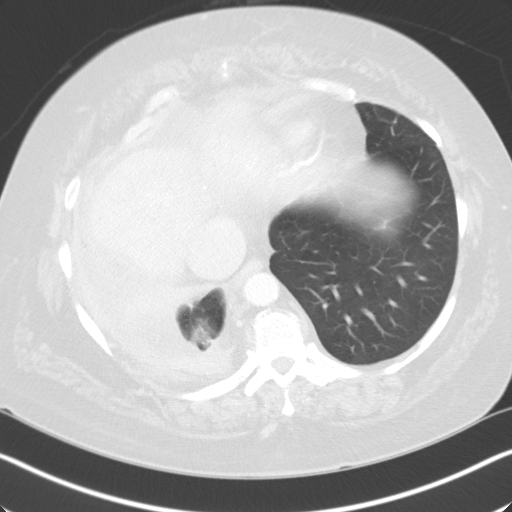
[im 102/131  lung]
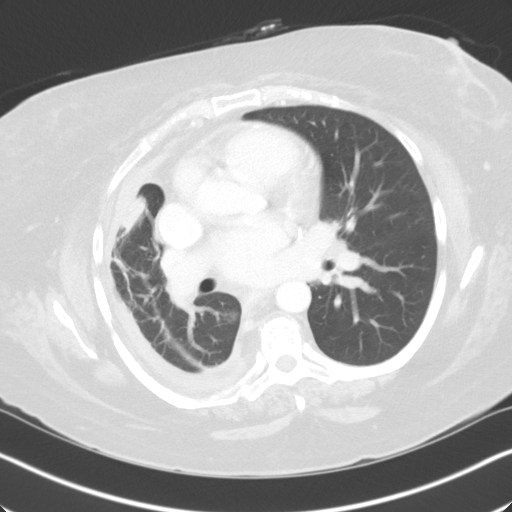
[im 116/131  mediastinal]
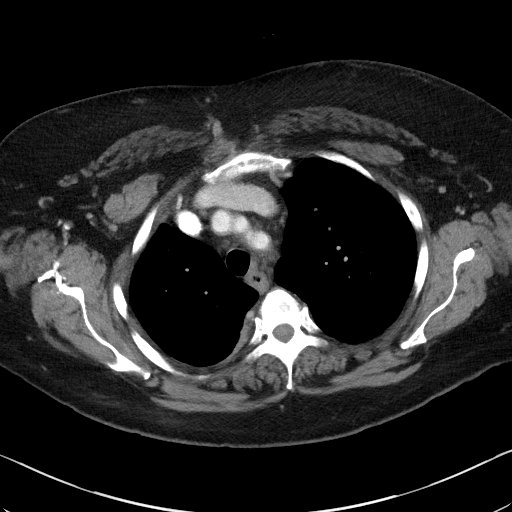
[im 116/131  lung]
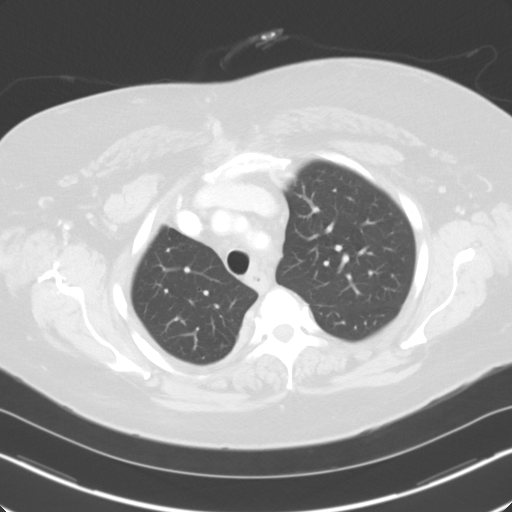

[Series 7: cap with. · axial · 0.75mm/px · z∈[-930,-570]mm · 6 of 131 slices shown]
[im 15/131  lung]
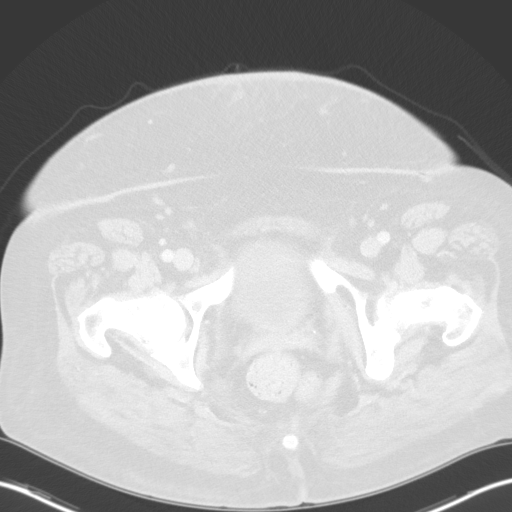
[im 29/131  lung]
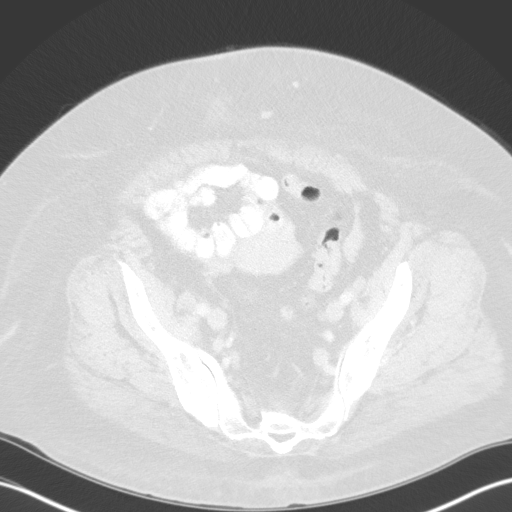
[im 44/131  lung]
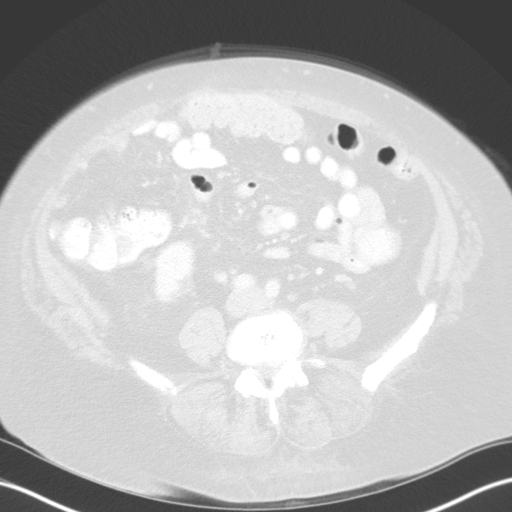
[im 58/131  lung]
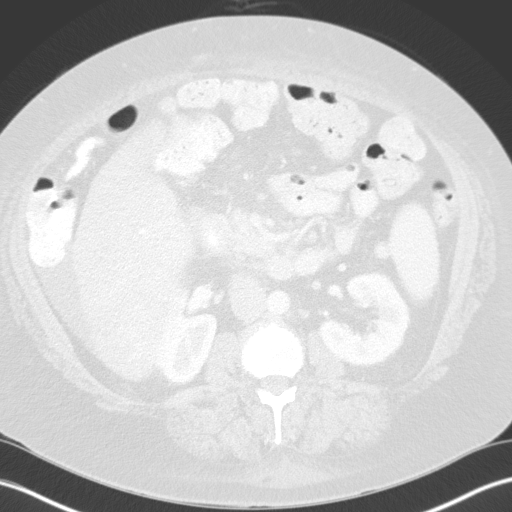
[im 73/131  lung]
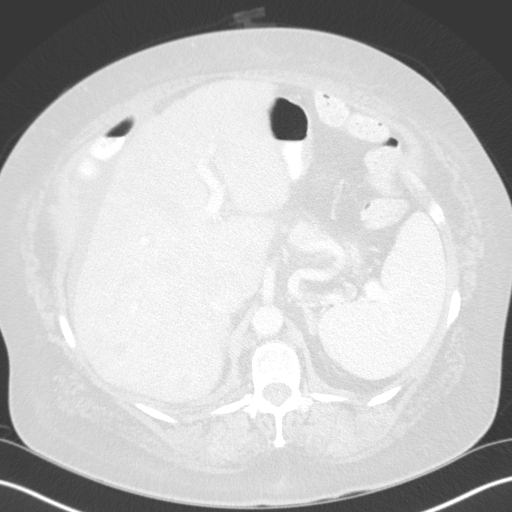
[im 87/131  lung]
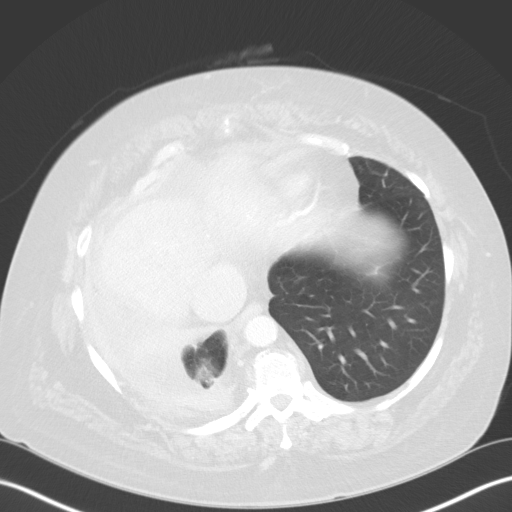

[15 of 31 positions shown; findings below may reference images not displayed]

FINDINGS: CT CHEST FINDINGS

Mediastinum/Lymph Nodes: Mild multinodular goiter appears stable. No
pathologically enlarged lymph nodes identified within the thorax.
Mild cardiomegaly and mild pericardial thickening and calcification
appears stable.

Lungs/Pleura: Chronic right pleural thickening is stable. Two
pleural-based opacities in the right lower lung with a swirling
pattern of adjacent airways are also stable, consistent with rounded
atelectasis.

Musculoskeletal: No chest wall mass or suspicious bone lesions
identified.

CT ABDOMEN PELVIS FINDINGS

Hepatobiliary: Hepatic cirrhosis and mild perihepatic ascites again
demonstrated, however no liver masses are identified. Prior
cholecystectomy noted. No evidence of biliary ductal dilatation.
Portal veins are patent.

Pancreas: No mass, inflammatory changes, or other significant
abnormality.

Spleen: Mild splenomegaly measuring approximately 15 cm in length,
consistent portal venous hypertension.

Adrenals/Urinary Tract: No masses identified. No evidence of
hydronephrosis.

Stomach/Bowel: No evidence of obstruction, inflammatory process, or
abnormal fluid collections.

Vascular/Lymphatic: No pathologically enlarged lymph nodes. No
evidence of abdominal aortic aneursym.

Reproductive: 4 cm anterior uterine fibroid is seen which is not
significantly changed. Adnexal regions are unremarkable.

Other: A small periumbilical hernia is again seen containing a loop
of transverse colon. No evidence of bowel obstruction or ischemia.

Musculoskeletal:  No suspicious bone lesions identified.
IMPRESSION: No acute findings identified within the chest, abdomen, or pelvis.

Stable chronic right pleural thickening, and areas of rounded
atelectasis in right lower lung.

Stable mild multinodular goiter. Stable mild cardiomegaly and
chronic pericardial calcification without effusion.

Stable hepatic cirrhosis. No radiographic evidence of hepatic
neoplasm.

Stable mild splenomegaly and ascites, consistent with portal venous
hypertension.

Stable 4 cm uterine fibroid.

## 2016-10-25 ENCOUNTER — Ambulatory Visit: Payer: Medicare Other | Admitting: Podiatry

## 2016-11-15 ENCOUNTER — Ambulatory Visit: Payer: Medicare Other | Admitting: Podiatry

## 2016-12-06 ENCOUNTER — Ambulatory Visit: Payer: Medicare Other | Admitting: Podiatry

## 2016-12-06 ENCOUNTER — Encounter: Payer: Self-pay | Admitting: *Deleted

## 2016-12-09 ENCOUNTER — Encounter: Admission: RE | Payer: Self-pay | Source: Ambulatory Visit

## 2016-12-09 ENCOUNTER — Ambulatory Visit: Admission: RE | Admit: 2016-12-09 | Payer: Medicare Other | Source: Ambulatory Visit | Admitting: Gastroenterology

## 2016-12-09 HISTORY — DX: Hyperlipidemia, unspecified: E78.5

## 2016-12-09 HISTORY — DX: Essential (primary) hypertension: I10

## 2016-12-09 HISTORY — DX: Heart failure, unspecified: I50.9

## 2016-12-09 HISTORY — DX: Angina pectoris, unspecified: I20.9

## 2016-12-09 HISTORY — DX: Polyp of colon: K63.5

## 2016-12-09 SURGERY — ESOPHAGOGASTRODUODENOSCOPY (EGD) WITH PROPOFOL
Anesthesia: General

## 2016-12-27 ENCOUNTER — Ambulatory Visit: Payer: Medicare Other | Admitting: Podiatry

## 2017-04-28 ENCOUNTER — Other Ambulatory Visit: Payer: Self-pay | Admitting: Gastroenterology

## 2017-04-28 DIAGNOSIS — R188 Other ascites: Principal | ICD-10-CM

## 2017-04-28 DIAGNOSIS — K7460 Unspecified cirrhosis of liver: Secondary | ICD-10-CM

## 2017-05-05 ENCOUNTER — Ambulatory Visit: Payer: Medicare Other

## 2017-06-05 ENCOUNTER — Ambulatory Visit: Payer: Medicare Other | Admitting: Nurse Practitioner

## 2017-06-09 ENCOUNTER — Encounter: Payer: Self-pay | Admitting: Nurse Practitioner

## 2017-06-09 ENCOUNTER — Ambulatory Visit
Admission: RE | Admit: 2017-06-09 | Discharge: 2017-06-09 | Disposition: A | Discharge status: Home / Self Care | Payer: Medicare Other | Source: Ambulatory Visit | Attending: Nurse Practitioner | Admitting: Nurse Practitioner

## 2017-06-09 ENCOUNTER — Ambulatory Visit: Payer: Medicare Other | Attending: Nurse Practitioner | Admitting: Nurse Practitioner

## 2017-06-09 DIAGNOSIS — G894 Chronic pain syndrome: Secondary | ICD-10-CM | POA: Insufficient documentation

## 2017-06-09 DIAGNOSIS — M5136 Other intervertebral disc degeneration, lumbar region: Secondary | ICD-10-CM | POA: Insufficient documentation

## 2017-06-09 DIAGNOSIS — M25512 Pain in left shoulder: Secondary | ICD-10-CM

## 2017-06-09 DIAGNOSIS — G8929 Other chronic pain: Secondary | ICD-10-CM

## 2017-06-09 DIAGNOSIS — K7460 Unspecified cirrhosis of liver: Secondary | ICD-10-CM | POA: Insufficient documentation

## 2017-06-09 DIAGNOSIS — I509 Heart failure, unspecified: Secondary | ICD-10-CM | POA: Insufficient documentation

## 2017-06-09 DIAGNOSIS — M19012 Primary osteoarthritis, left shoulder: Secondary | ICD-10-CM | POA: Diagnosis not present

## 2017-06-09 DIAGNOSIS — M25561 Pain in right knee: Secondary | ICD-10-CM

## 2017-06-09 DIAGNOSIS — M25511 Pain in right shoulder: Secondary | ICD-10-CM | POA: Diagnosis not present

## 2017-06-09 DIAGNOSIS — Z7984 Long term (current) use of oral hypoglycemic drugs: Secondary | ICD-10-CM | POA: Insufficient documentation

## 2017-06-09 DIAGNOSIS — Z789 Other specified health status: Secondary | ICD-10-CM | POA: Insufficient documentation

## 2017-06-09 DIAGNOSIS — E1122 Type 2 diabetes mellitus with diabetic chronic kidney disease: Secondary | ICD-10-CM | POA: Diagnosis not present

## 2017-06-09 DIAGNOSIS — M542 Cervicalgia: Principal | ICD-10-CM

## 2017-06-09 DIAGNOSIS — Z9049 Acquired absence of other specified parts of digestive tract: Secondary | ICD-10-CM | POA: Diagnosis not present

## 2017-06-09 DIAGNOSIS — M1711 Unilateral primary osteoarthritis, right knee: Secondary | ICD-10-CM | POA: Diagnosis not present

## 2017-06-09 DIAGNOSIS — Z9889 Other specified postprocedural states: Secondary | ICD-10-CM | POA: Diagnosis not present

## 2017-06-09 DIAGNOSIS — N189 Chronic kidney disease, unspecified: Secondary | ICD-10-CM | POA: Diagnosis not present

## 2017-06-09 DIAGNOSIS — R918 Other nonspecific abnormal finding of lung field: Secondary | ICD-10-CM | POA: Insufficient documentation

## 2017-06-09 DIAGNOSIS — Z79899 Other long term (current) drug therapy: Secondary | ICD-10-CM | POA: Insufficient documentation

## 2017-06-09 DIAGNOSIS — M545 Low back pain, unspecified: Secondary | ICD-10-CM | POA: Insufficient documentation

## 2017-06-09 DIAGNOSIS — M549 Dorsalgia, unspecified: Secondary | ICD-10-CM | POA: Diagnosis present

## 2017-06-09 DIAGNOSIS — I130 Hypertensive heart and chronic kidney disease with heart failure and stage 1 through stage 4 chronic kidney disease, or unspecified chronic kidney disease: Secondary | ICD-10-CM | POA: Insufficient documentation

## 2017-06-09 DIAGNOSIS — M50321 Other cervical disc degeneration at C4-C5 level: Secondary | ICD-10-CM | POA: Diagnosis not present

## 2017-06-09 DIAGNOSIS — M899 Disorder of bone, unspecified: Secondary | ICD-10-CM | POA: Diagnosis not present

## 2017-06-09 DIAGNOSIS — F329 Major depressive disorder, single episode, unspecified: Secondary | ICD-10-CM | POA: Insufficient documentation

## 2017-06-09 NOTE — Patient Instructions (Signed)
____________________________________________________________________________________________  Appointment Policy Summary  It is our goal and responsibility to provide the medical community with assistance in the evaluation and management of patients with chronic pain. Unfortunately our resources are limited. Because we do not have an unlimited amount of time, or available appointments, we are required to closely monitor and manage their use. The following rules exist to maximize their use:  Patient's responsibilities: 1. Punctuality:  At what time should I arrive? You should be physically present in our office 30 minutes before your scheduled appointment. Your scheduled appointment is with your assigned healthcare provider. However, it takes 5-10 minutes to be "checked-in", and another 15 minutes for the nurses to do the admission. If you arrive to our office at the time you were given for your appointment, you will end up being at least 20-25 minutes late to your appointment with the provider. 2. Tardiness:  What happens if I arrive only a few minutes after my scheduled appointment time? You will need to reschedule your appointment. The cutoff is your appointment time. This is why it is so important that you arrive at least 30 minutes before that appointment. If you have an appointment scheduled for 10:00 AM and you arrive at 10:01, you will be required to reschedule your appointment.  3. Plan ahead:  Always assume that you will encounter traffic on your way in. Plan for it. If you are dependent on a driver, make sure they understand these rules and the need to arrive early. 4. Other appointments and responsibilities:  Avoid scheduling any other appointments before or after your pain clinic appointments.  5. Be prepared:  Write down everything that you need to discuss with your healthcare provider and give this information to the admitting nurse. Write down the medications that you will need  refilled. Bring your pills and bottles (even the empty ones), to all of your appointments, except for those where a procedure is scheduled. 6. No children or pets:  Find someone to take care of them. It is not appropriate to bring them in. 7. Scheduling changes:  We request "advanced notification" of any changes or cancellations. 8. Advanced notification:  Defined as a time period of more than 24 hours prior to the originally scheduled appointment. This allows for the appointment to be offered to other patients. 9. Rescheduling:  When a visit is rescheduled, it will require the cancellation of the original appointment. For this reason they both fall within the category of "Cancellations".  10. Cancellations:  They require advanced notification. Any cancellation less than 24 hours before the  appointment will be recorded as a "No Show". 11. No Show:  Defined as an unkept appointment where the patient failed to notify or declare to the practice their intention or inability to keep the appointment.  Corrective process for repeat offenders:  1. Tardiness: Three (3) episodes of rescheduling due to late arrivals will be recorded as one (1) "No Show". 2. Cancellation or reschedule: Three (3) cancellations or rescheduling will be recorded as one (1) "No Show". 3. "No Shows": Three (3) "No Shows" within a 12 month period will result in discharge from the practice. ____________________________________________________________________________________________  ____________________________________________________________________________________________  Pain Scale  Introduction: The pain score used by this practice is the Verbal Numerical Rating Scale (VNRS-11). This is an 11-point scale. It is for adults and children 10 years or older. There are significant differences in how the pain score is reported, used, and applied. Forget everything you learned in the past and learn  this scoring system.  General  Information: The scale should reflect your current level of pain. Unless you are specifically asked for the level of your worst pain, or your average pain. If you are asked for one of these two, then it should be understood that it is over the past 24 hours.  Basic Activities of Daily Living (ADL): Personal hygiene, dressing, eating, transferring, and using restroom.  Instructions: Most patients tend to report their level of pain as a combination of two factors, their physical pain and their psychosocial pain. This last one is also known as "suffering" and it is reflection of how physical pain affects you socially and psychologically. From now on, report them separately. From this point on, when asked to report your pain level, report only your physical pain. Use the following table for reference.  Pain Clinic Pain Levels (0-5/10)  Pain Level Score  Description  No Pain 0   Mild pain 1 Nagging, annoying, but does not interfere with basic activities of daily living (ADL). Patients are able to eat, bathe, get dressed, toileting (being able to get on and off the toilet and perform personal hygiene functions), transfer (move in and out of bed or a chair without assistance), and maintain continence (able to control bladder and bowel functions). Blood pressure and heart rate are unaffected. A normal heart rate for a healthy adult ranges from 60 to 100 bpm (beats per minute).   Mild to moderate pain 2 Noticeable and distracting. Impossible to hide from other people. More frequent flare-ups. Still possible to adapt and function close to normal. It can be very annoying and may have occasional stronger flare-ups. With discipline, patients may get used to it and adapt.   Moderate pain 3 Interferes significantly with activities of daily living (ADL). It becomes difficult to feed, bathe, get dressed, get on and off the toilet or to perform personal hygiene functions. Difficult to get in and out of bed or a chair  without assistance. Very distracting. With effort, it can be ignored when deeply involved in activities.   Moderately severe pain 4 Impossible to ignore for more than a few minutes. With effort, patients may still be able to manage work or participate in some social activities. Very difficult to concentrate. Signs of autonomic nervous system discharge are evident: dilated pupils (mydriasis); mild sweating (diaphoresis); sleep interference. Heart rate becomes elevated (>115 bpm). Diastolic blood pressure (lower number) rises above 100 mmHg. Patients find relief in laying down and not moving.   Severe pain 5 Intense and extremely unpleasant. Associated with frowning face and frequent crying. Pain overwhelms the senses.  Ability to do any activity or maintain social relationships becomes significantly limited. Conversation becomes difficult. Pacing back and forth is common, as getting into a comfortable position is nearly impossible. Pain wakes you up from deep sleep. Physical signs will be obvious: pupillary dilation; increased sweating; goosebumps; brisk reflexes; cold, clammy hands and feet; nausea, vomiting or dry heaves; loss of appetite; significant sleep disturbance with inability to fall asleep or to remain asleep. When persistent, significant weight loss is observed due to the complete loss of appetite and sleep deprivation.  Blood pressure and heart rate becomes significantly elevated. Caution: If elevated blood pressure triggers a pounding headache associated with blurred vision, then the patient should immediately seek attention at an urgent or emergency care unit, as these may be signs of an impending stroke.    Emergency Department Pain Levels (6-10/10)  Emergency Room Pain 6 Severely  limiting. Requires emergency care and should not be seen or managed at an outpatient pain management facility. Communication becomes difficult and requires great effort. Assistance to reach the emergency department  may be required. Facial flushing and profuse sweating along with potentially dangerous increases in heart rate and blood pressure will be evident.   Distressing pain 7 Self-care is very difficult. Assistance is required to transport, or use restroom. Assistance to reach the emergency department will be required. Tasks requiring coordination, such as bathing and getting dressed become very difficult.   Disabling pain 8 Self-care is no longer possible. At this level, pain is disabling. The individual is unable to do even the most "basic" activities such as walking, eating, bathing, dressing, transferring to a bed, or toileting. Fine motor skills are lost. It is difficult to think clearly.   Incapacitating pain 9 Pain becomes incapacitating. Thought processing is no longer possible. Difficult to remember your own name. Control of movement and coordination are lost.   The worst pain imaginable 10 At this level, most patients pass out from pain. When this level is reached, collapse of the autonomic nervous system occurs, leading to a sudden drop in blood pressure and heart rate. This in turn results in a temporary and dramatic drop in blood flow to the brain, leading to a loss of consciousness. Fainting is one of the body's self defense mechanisms. Passing out puts the brain in a calmed state and causes it to shut down for a while, in order to begin the healing process.    Summary: 1. Refer to this scale when providing [place] with your pain level. 2. Be accurate and careful when reporting your pain level. This will help with your care. 3. Over-reporting your pain level will lead to loss of credibility. 4. Even a level of 1/10 means that there is pain and will be treated at our facility. 5. High, inaccurate reporting will be documented as "Symptom Exaggeration", leading to loss of credibility and suspicions of possible secondary gains such as obtaining more narcotics, or wanting to appear disabled, for  fraudulent reasons. 6. Only pain levels of 5 or below will be seen at our facility. 7. Pain levels of 6 and above will be sent to the Emergency Department and the appointment cancelled. ____________________________________________________________________________________________

## 2017-06-09 NOTE — Progress Notes (Signed)
Patient's Name: [name]  MRN: [id number]  Referring Provider: Tandy Gaw, [place]  DOB: Feb 15, 1956  PCP: [facility], [place]: 06/09/2017  Note by: Dionisio David NP  Service setting: Ambulatory outpatient  Specialty: Interventional Pain Management  Location: [facility] (AMB) Pain Management Facility    Patient type: New Patient    Primary Reason(s) for Visit: Initial Patient Evaluation CC: Generalized Body Aches (everything hurts); Neck Pain (s/p surgery ); Knee Pain (right s/p torn meniscus); and Back Pain (lower bulging disc )  HPI  [name] is a 62 y.o. year old, female patient, who comes today for an initial evaluation. She has Hepatic cirrhosis ([place]); Polyp of colon; DJD (degenerative joint disease) of knee; Diabetes mellitus ([place]); Fibromyalgia; Cirrhosis of liver ([place]); Chronic pain of right knee (Primary Area of Pain); Chronic bilateral low back pain without sciatica (Secondary Area of Pain) (R>L); Chronic neck pain (Tertiary Area of Pain); Chronic pain of both shoulders (Fourth Area of Pain) (R>L); Chronic pain syndrome; Pharmacologic therapy; Disorder of skeletal system; and Problems influencing health status on their problem list.. Her primarily concern today is the Generalized Body Aches (everything hurts); Neck Pain (s/p surgery ); Knee Pain (right s/p torn meniscus); and Back Pain (lower bulging disc )  Pain Assessment: Location:   (generalized aches and pains.  "everything hurts") Radiating: na Onset: More than a month ago Duration: Chronic pain Quality: Constant, Burning, Nagging Severity: 9 /10 (self-reported pain score)  Note: Reported level is compatible with observation. Clinically the patient looks like a 1/10 A 1/10 is viewed as "Mild" and described as nagging, annoying, but not interfering with basic activities of daily living (ADL). [name] is able to eat, bathe, get dressed, do toileting (being able to get on and off the toilet and perform personal  hygiene functions), transfer (move in and out of bed or a chair without assistance), and maintain continence (able to control bladder and bowel functions). Physiologic parameters such as blood pressure and heart rate apear wnl. Information on the proper use of the pain scale provided to the patient today. When using our objective Pain Scale, levels between 6 and 10/10 are said to belong in an emergency room, as it progressively worsens from a 6/10, described as severely limiting, requiring emergency care not usually available at an outpatient pain management facility. At a 6/10 level, communication becomes difficult and requires great effort. Assistance to reach the emergency department may be required. Facial flushing and profuse sweating along with potentially dangerous increases in heart rate and blood pressure will be evident. Effect on ADL: patient states that she doesn't have very good days.  difficulty getting household chores performed.  Timing: Constant Modifying factors: sleeping  Onset and Duration: Gradual and Date of onset: 2005 Cause of pain: Unknown Severity: Getting worse, NAS-11 at its worse: 9/10, NAS-11 at its best: 7/10, NAS-11 now: 8/10 and NAS-11 on the average: 8/10 Timing: Not influenced by the time of the day, During activity or exercise, After activity or exercise and After a period of immobility Aggravating Factors: Bending, Climbing, Kneeling, Lifiting, Motion, Prolonged sitting, Prolonged standing, Squatting, Stooping , Twisting, Walking, Walking uphill, Walking downhill and Working Alleviating Factors: Lying down, Medications, Resting, Sleeping and Warm showers or baths Associated Problems: Depression, Dizziness, Fatigue, Inability to concentrate, Sadness, Swelling and Weakness Quality of Pain: Agonizing, Constant, Disabling, Dreadful, Exhausting, Getting longer, Heavy, Nagging, Pressure-like, Pulsating, Throbbing, Uncomfortable and Work related Previous Examinations or  Tests: The patient denies none Previous Treatments:  The patient denies none  The patient comes into the clinics today for the first time for a chronic pain management evaluation. According to the patient's daughter her primary area of pain is in her right knee. She then agrees. She is somewhat a poor historian. She admits that she has had meniscus repair several years ago. He has also had steroid injections orthopedist. She admits that she did have physical therapy. She denies any recent images.  Her second area of pain is in her lower back. She admits that the small midline. She denies any radiating pain. She denies any previous surgeries, interventional therapy, physical therapy or recent images.  Her third area of pain is in her neck. He admits that this is secondary to a fall. He has suffered multiple falls in the past. She denies any upper extremity involvement She did undergo C4-5 fusion 2004. He admits that the surgery wasn't effective. She denies any interventional therapy, recent physical therapy or recent images.  Her fourth area of pain is in her shoulder right being greater than the left. She admits that this is related to a fall. She denies any numbness, tingling or weakness in her upper arms. She denies any previous interventional therapy or physical therapy.  Today I took the time to provide the patient with information regarding this pain practice. The patient was informed that the practice is divided into two sections: an interventional pain management section, as well as a completely separate and distinct medication management section. I explained that there are procedure days for interventional therapies, and evaluation days for follow-ups and medication management. Because of the amount of documentation required during both, they are kept separated. This means that there is the possibility that she may be scheduled for a procedure on one day, and medication management the next. I have  also informed her that because of staffing and facility limitations, this practice will no longer take patients for medication management only. To illustrate the reasons for this, I gave the patient the example of surgeons, and how inappropriate it would be to refer a patient to his/her care, just to write for the post-surgical antibiotics on a surgery done by a different surgeon.   Because interventional pain management is part of the board-certified specialty for the doctors, the patient was informed that joining this practice means that they are open to any and all interventional therapies. I made it clear that this does not mean that they will be forced to have any procedures done. What this means is that I believe interventional therapies to be essential part of the diagnosis and proper management of chronic pain conditions. Therefore, patients not interested in these interventional alternatives will be better served under the care of a different practitioner.  The patient was also made aware of my Comprehensive Pain Management Safety Guidelines where by joining this practice, they limit all of their nerve blocks and joint injections to those done by our practice, for as long as we are retained to manage their care. Historic Controlled Substance Pharmacotherapy Review  PMP and historical list of controlled substances: Oxycodone/acetaminophen 5/325 mg, tramadol 50 mg, oxycodone/acetaminophen 10/325 mg Highest opioid analgesic regimen found: Oxycodone/acetaminophen 10/325 one tablet every 3 hours (fill date for 07/21/2013) oxycodone 80 mg Most recent opioid analgesic: None (last 2015) Current opioid analgesics: None Highest recorded MME/day: 120 mg/day MME/day: 0 mg/day Medications: The patient did not bring the medication(s) to the appointment, as requested in our "New Patient Package" Pharmacodynamics: Desired effects: Analgesia:  The patient reports >50% benefit. Reported improvement in  function: The patient reports medication allows her to accomplish basic ADLs. Clinically meaningful improvement in function (CMIF): Sustained CMIF goals met Perceived effectiveness: Described as relatively effective, allowing for increase in activities of daily living (ADL) Undesirable effects: Side-effects or Adverse reactions: None reported Historical Monitoring: The patient  reports that she does not use drugs. List of all UDS Test(s): Lab Results  Component Value Date   MDMA NEGATIVE 06/22/2011   MDMA NEGATIVE 05/25/2011   COCAINSCRNUR NEGATIVE 06/22/2011   COCAINSCRNUR NEGATIVE 05/25/2011   PCPSCRNUR NEGATIVE 06/22/2011   PCPSCRNUR NEGATIVE 05/25/2011   THCU NEGATIVE 06/22/2011   THCU NEGATIVE 05/25/2011   List of all Serum Drug Screening Test(s):  No results found for: AMPHSCRSER, BARBSCRSER, BENZOSCRSER, COCAINSCRSER, PCPSCRSER, PCPQUANT, THCSCRSER, CANNABQUANT, OPIATESCRSER, OXYSCRSER, PROPOXSCRSER Historical Background Evaluation: [place] PDMP: Six (6) year initial data search conducted.             [place] Department of public safety, offender search: Editor, commissioning Information) Non-contributory Risk Assessment Profile: Aberrant behavior: None observed or detected today Risk factors for fatal opioid overdose: None identified today Fatal overdose hazard ratio (HR): Calculation deferred Non-fatal overdose hazard ratio (HR): Calculation deferred Risk of opioid abuse or dependence: 0.7-3.0% with doses ? 36 MME/day and 6.1-26% with doses ? 120 MME/day. Substance use disorder (SUD) risk level: Pending results of Medical Psychology Evaluation for SUD Opioid risk tool (ORT) (Total Score): 8  ORT Scoring interpretation table:  Score <3 = Low Risk for SUD  Score between 4-7 = Moderate Risk for SUD  Score >8 = High Risk for Opioid Abuse   PHQ-2 Depression Scale:  Total score: 0  PHQ-2 Scoring interpretation table: (Score and probability of major depressive disorder)  Score 0 = No depression   Score 1 = 15.4% Probability  Score 2 = 21.1% Probability  Score 3 = 38.4% Probability  Score 4 = 45.5% Probability  Score 5 = 56.4% Probability  Score 6 = 78.6% Probability   PHQ-9 Depression Scale:  Total score: 0  PHQ-9 Scoring interpretation table:  Score 0-4 = No depression  Score 5-9 = Mild depression  Score 10-14 = Moderate depression  Score 15-19 = Moderately severe depression  Score 20-27 = Severe depression (2.4 times higher risk of SUD and 2.89 times higher risk of overuse)   Pharmacologic Plan: Pending ordered tests and/or consults  Meds  The patient has a current medication list which includes the following prescription(s): acetaminophen, bupropion, vitamin d3, citalopram, furosemide, lactulose, metformin, methocarbamol, risperidone, spironolactone, and xifaxan.  Current Outpatient Medications on File Prior to Visit  Medication Sig  . acetaminophen (TYLENOL) 500 MG tablet Take 500 mg by mouth every 6 (six) hours as needed.  Marland Kitchen buPROPion (WELLBUTRIN XL) 300 MG 24 hr tablet Take 300 mg by mouth daily.  . Cholecalciferol (VITAMIN D3) 1000 UNITS CAPS Take by mouth.  . citalopram (CELEXA) 40 MG tablet Take 40 mg by mouth daily.  . furosemide (LASIX) 20 MG tablet Take 20 mg by mouth 2 (two) times daily.  Marland Kitchen lactulose (CHRONULAC) 10 GM/15ML solution   . metFORMIN (GLUCOPHAGE) 500 MG tablet   . methocarbamol (ROBAXIN) 750 MG tablet Take 750 mg by mouth 3 (three) times daily.  . risperiDONE (RISPERDAL) 2 MG tablet Take 2 mg by mouth 2 (two) times daily.  Marland Kitchen spironolactone (ALDACTONE) 25 MG tablet   . XIFAXAN 550 MG TABS tablet    No current facility-administered medications on file prior to visit.  Imaging Review  Cervical Imaging:  Cervical DG 2-3 views:  Results for orders placed in visit on 05/31/02  DG Cervical Spine 2-3 Views   Narrative FINDINGS CLINICAL DATA:  C5-6 DISK HERNIATION. CERVICAL SPINE - TWO VIEWS TWO INTRAOPERATIVE CROSS TABLE LATERAL RADIOGRAPHS  OF THE CERVICAL SPINE WERE OBTAINED INTRAOPERATIVELY.  THESE SHOW PLACEMENT OF AN INTERBODY BONE PLUG AND ANTERIOR FIXATION PLATE AND SCREWS AT I6-9.  ALIGNMENT OF THE CERVICAL SPINE IS NORMAL. IMPRESSION ANTERIOR CERVICAL SPINE FUSION AT C5-6 WITH NORMAL ALIGNMENT. FLUOROSCOPY FLUOROSCOPY WAS UTILIZED BY THE REQUESTING PHYSICIAN. IMPRESSION NO RADIOGRAPHIC INTERPRETATION.    Note: Available results from prior imaging studies were reviewed.        ROS  Cardiovascular History: No reported cardiovascular signs or symptoms such as High blood pressure, coronary artery disease, abnormal heart rate or rhythm, heart attack, blood thinner therapy or heart weakness and/or failure Pulmonary or Respiratory History: No reported pulmonary signs or symptoms such as wheezing and difficulty taking a deep full breath (Asthma), difficulty blowing air out (Emphysema), coughing up mucus (Bronchitis), persistent dry cough, or temporary stoppage of breathing during sleep Neurological History: Abnormal skin sensations (Peripheral Neuropathy) Review of Past Neurological Studies: No results found for this or any previous visit. Psychological-Psychiatric History: Depressed Gastrointestinal History: Alternating episodes iof diarrhea and constipation (IBS-Irritable bowe syndrome) Genitourinary History: No reported renal or genitourinary signs or symptoms such as difficulty voiding or producing urine, peeing blood, non-functioning kidney, kidney stones, difficulty emptying the bladder, difficulty controlling the flow of urine, or chronic kidney disease Hematological History: Brusing easily Endocrine History: No reported endocrine signs or symptoms such as high or low blood sugar, rapid heart rate due to high thyroid levels, obesity or weight gain due to slow thyroid or thyroid disease Rheumatologic History: Generalized muscle aches (Fibromyalgia) Musculoskeletal History: Negative for myasthenia gravis, muscular  dystrophy, multiple sclerosis or malignant hyperthermia Work History: Disabled  Allergies  Ms. Comunale is allergic to vicodin [hydrocodone-acetaminophen].  Laboratory Chemistry  Inflammation Markers No results found for: CRP, ESRSEDRATE (CRP: Acute Phase) (ESR: Chronic Phase) Renal Function Markers Lab Results  Component Value Date   BUN 7 07/28/2014   CREATININE 0.51 07/28/2014   GFRAA >60 07/28/2014   GFRNONAA >60 07/28/2014   Hepatic Function Markers Lab Results  Component Value Date   AST 28 05/26/2013   ALT 18 05/26/2013   ALBUMIN 3.8 05/26/2013   ALKPHOS 209 (H) 05/26/2013   Electrolytes Lab Results  Component Value Date   NA 134 (L) 07/28/2014   K 3.9 07/28/2014   CL 99 (L) 07/28/2014   CALCIUM 9.7 07/28/2014   Neuropathy Markers No results found for: [id number] Bone Pathology Markers Lab Results  Component Value Date   ALKPHOS 209 (H) 05/26/2013   CALCIUM 9.7 07/28/2014   Coagulation Parameters Lab Results  Component Value Date   INR 1.2 09/21/2013   LABPROT 14.7 09/21/2013   APTT 33.2 09/09/2013   PLT 253 05/26/2013   Cardiovascular Markers Lab Results  Component Value Date   HGB 11.7 (L) 05/26/2013   HCT 35.0 05/26/2013   Note: Lab results reviewed.  [place]  Drug: Ms. Ullmer  reports that she does not use drugs. Alcohol:  reports that she does not drink alcohol. Tobacco:  reports that  has never smoked. she has never used smokeless tobacco. Medical:  has a past medical history of Allergy, Anemia, Anginal pain ([place]), CHF (congestive heart failure) ([place]), Colonic polyp, DDD (degenerative disc disease), cervical (ddd), Depression, Diabetes mellitus without complication (  [place]), Fibromyalgia, Gout, Hyperlipidemia, Hypertension, IBS (irritable bowel syndrome), Liver cirrhosis ([place]), and Polyneuropathy. Family: family history includes Alcohol abuse in her father and mother; Arthritis in her mother; Depression in her father and mother; Diabetes in her  mother; Hypertension in her mother; Mental illness in her father and mother.  Past Surgical History:  Procedure Laterality Date  . CERVICAL DISC SURGERY    . CESAREAN SECTION  [id number]  . CHOLECYSTECTOMY  2015  . COLONOSCOPY W/ BIOPSIES AND POLYPECTOMY  06/10/2013  . ESOPHAGOGASTRODUODENOSCOPY    . HERNIA REPAIR  2015  . KNEE CARTILAGE SURGERY Right   . partial amputation     toe surgery  . SPINE SURGERY    . TOE AMPUTATION Right 2015   partial amputation 3rd toe  . TONSILLECTOMY  1987  . UMBILICAL HERNIA REPAIR     Active Ambulatory Problems    Diagnosis Date Noted  . Hepatic cirrhosis ([place]) 11/18/2014  . Polyp of colon 11/18/2014  . DJD (degenerative joint disease) of knee 09/21/2015  . Diabetes mellitus ([place]) 09/21/2015  . Fibromyalgia 09/21/2015  . Cirrhosis of liver ([place]) 09/21/2015  . Chronic pain of right knee (Primary Area of Pain) 06/09/2017  . Chronic bilateral low back pain without sciatica (Secondary Area of Pain) (R>L) 06/09/2017  . Chronic neck pain (Tertiary Area of Pain) 06/09/2017  . Chronic pain of both shoulders (Fourth Area of Pain) (R>L) 06/09/2017  . Chronic pain syndrome 06/09/2017  . Pharmacologic therapy 06/09/2017  . Disorder of skeletal system 06/09/2017  . Problems influencing health status 06/09/2017   Resolved Ambulatory Problems    Diagnosis Date Noted  . No Resolved Ambulatory Problems   Past Medical History:  Diagnosis Date  . Allergy   . Anemia   . Anginal pain ([place])   . CHF (congestive heart failure) ([place])   . Colonic polyp   . DDD (degenerative disc disease), cervical ddd  . Depression   . Diabetes mellitus without complication ([place])   . Fibromyalgia   . Gout   . Hyperlipidemia   . Hypertension   . IBS (irritable bowel syndrome)   . Liver cirrhosis ([place])   . Polyneuropathy    Constitutional Exam  General appearance: Well nourished, well developed, and well hydrated. In no apparent acute distress Vitals:   06/09/17 1318   BP: 133/60  Pulse: 80  Resp: 16  Temp: 98 F (36.7 C)  TempSrc: Oral  SpO2: 97%  Height: _0  (1.651 m)   BMI Assessment: Estimated body mass index is 42.77 kg/m as calculated from the following:   Height as of this encounter: _1  (1.651 m).   Weight as of 09/21/15: 257 lb (116.6 kg).  BMI interpretation table: BMI level Category Range association with higher incidence of chronic pain  <18 kg/m2 Underweight   18.5-24.9 kg/m2 Ideal body weight   25-29.9 kg/m2 Overweight Increased incidence by 20%  30-34.9 kg/m2 Obese (Class I) Increased incidence by 68%  35-39.9 kg/m2 Severe obesity (Class II) Increased incidence by 136%  >40 kg/m2 Extreme obesity (Class III) Increased incidence by 254%   BMI Readings from Last 4 Encounters:  06/09/17 42.77 kg/m  09/21/15 42.77 kg/m  09/12/15 43.00 kg/m  08/23/14 38.11 kg/m   Wt Readings from Last 4 Encounters:  09/21/15 257 lb (116.6 kg)  09/12/15 258 lb 6.4 oz (117.2 kg)  08/23/14 229 lb (103.9 kg)  11/25/13 200 lb (90.7 kg)  Psych/Mental status: Alert, oriented x 3 (person, place, & time)  Eyes: PERLA Respiratory: No evidence of acute respiratory distress  Cervical Spine Exam  Inspection: Well healed scar from previous spine surgery detected Alignment: Symmetrical Functional ROM: Decreased ROM      Stability: No instability detected Muscle strength & Tone: Functionally intact Sensory: Unimpaired Palpation: Negative              Upper Extremity (UE) Exam    Side: Right upper extremity  Side: Left upper extremity  Inspection: No masses, redness, swelling, or asymmetry. No contractures  Inspection: No masses, redness, swelling, or asymmetry. No contractures  Functional ROM: Adequate ROM          Functional ROM: Adequate ROM          Muscle strength & Tone: Functionally intact  Muscle strength & Tone: Functionally intact  Sensory: Unimpaired  Sensory: Unimpaired  Palpation: No palpable anomalies              Palpation:  No palpable anomalies              Specialized Test(s): Deferred         Specialized Test(s): Deferred          Thoracic Spine Exam  Inspection: No masses, redness, or swelling Alignment: Symmetrical Functional ROM: Unrestricted ROM Stability: No instability detected Sensory: Unimpaired Muscle strength & Tone: No palpable anomalies  Lumbar Spine Exam  Inspection: No masses, redness, or swelling Alignment: Symmetrical Functional ROM: Unrestricted ROM      Stability: No instability detected Muscle strength & Tone: Functionally intact Sensory: Unimpaired Palpation: Complains of area being tender to palpation       Provocative Tests: Lumbar Hyperextension and rotation test: Positive      rotation deferred right leg raise positive greater than 45 Patrick's Maneuver: Negative                  limitations secondary to her right knee  Gait & Posture Assessment  Ambulation: Patient ambulates using a walker Gait: Limited. Using assistive device to ambulate Posture: WNL   Lower Extremity Exam    Side: Right lower extremity  Side: Left lower extremity  Inspection: Edema  Inspection: No masses, redness, swelling, or asymmetry. No contractures  Functional ROM: Decreased ROM          Functional ROM: Unrestricted ROM          Muscle strength & Tone: Functionally intact  Muscle strength & Tone: Functionally intact  Sensory: Unimpaired  Sensory: Unimpaired  Palpation: Tender  Palpation: No palpable anomalies   Assessment  Primary Diagnosis & Pertinent Problem List: Diagnoses of Chronic pain of right knee (Primary Area of Pain), Chronic bilateral low back pain without sciatica (Secondary Area of Pain) (R>L), Chronic neck pain (Tertiary Area of Pain), Chronic pain of both shoulders (Fourth Area of Pain) (R>L), Chronic pain syndrome, Pharmacologic therapy, Disorder of skeletal system, and Problems influencing health status were pertinent to this visit.  Visit Diagnosis: 1. Chronic pain of right  knee (Primary Area of Pain)   2. Chronic bilateral low back pain without sciatica (Secondary Area of Pain) (R>L)   3. Chronic neck pain (Tertiary Area of Pain)   4. Chronic pain of both shoulders (Fourth Area of Pain) (R>L)   5. Chronic pain syndrome   6. Pharmacologic therapy   7. Disorder of skeletal system   8. Problems influencing health status    Plan of Care  Initial treatment plan:  Please be advised that as per protocol, today's visit has been  an evaluation only. We have not taken over the patient's controlled substance management.  Problem-specific plan: No problem-specific Assessment & Plan notes found for this encounter.  Ordered Lab-work, Procedure(s), Referral(s), & Consult(s): Orders Placed This Encounter  Procedures  . DG Cervical Spine With Flex & Extend  . DG Lumbar Spine Complete W/Bend  . DG Knee 1-2 Views Right  . DG Shoulder Right  . DG Shoulder Left  . Compliance Drug Analysis, Ur  . Comp. Metabolic Panel (12)  . Magnesium  . Vitamin B12  . Sedimentation rate  . 25-Hydroxyvitamin D Lcms D2+D3  . C-reactive protein  . Ambulatory referral to Psychology   Pharmacotherapy: Medications ordered:  No orders of the defined types were placed in this encounter.  Medications administered during this visit: Gustavus Messing had no medications administered during this visit.   Pharmacotherapy under consideration:  Opioid Analgesics: The patient was informed that there is no guarantee that she would be a candidate for opioid analgesics. The decision will be made following CDC guidelines. This decision will be based on the results of diagnostic studies, as well as Ms. Brownstein's risk profile.  Membrane stabilizer: To be determined at a later time Muscle relaxant: To be determined at a later time NSAID: To be determined at a later time Other analgesic(s): To be determined at a later time   Interventional therapies under consideration: Ms. Charpentier was informed  that there is no guarantee that she would be a candidate for interventional therapies. The decision will be based on the results of diagnostic studies, as well as Ms. Torbert's risk profile.  Possible procedure(s): Diagnostic right intra-articular knee injection Hyalgan series Diagnostic right genicular nerve block Possible right genicular nerve rate frequency ablation Diagnostic bilateral lumbar facet nerve block Possible bilateral lumbar facet RFA Diagnostic bilateral cervical facet nerve block Diagnostic bilateral facet RFA Diagnostic bilateral intra-articular shoulder injections    Provider-requested follow-up: Return for 2nd Visit, w/ Dr. Dossie Arbour, after [facility] eval, xrays.  No future appointments.  Primary Care Physician: [facility], [place] Location: [facility] Note by:  Date: 06/09/2017; Time: 3:09 PM  Pain Score Disclaimer: We use the NRS-11 scale. This is a self-reported, subjective measurement of pain severity with only modest accuracy. It is used primarily to identify changes within a particular patient. It must be understood that outpatient pain scales are significantly less accurate that those used for research, where they can be applied under ideal controlled circumstances with minimal exposure to variables. In reality, the score is likely to be a combination of pain intensity and pain affect, where pain affect describes the degree of emotional arousal or changes in action readiness caused by the sensory experience of pain. Factors such as social and work situation, setting, emotional state, anxiety levels, expectation, and prior pain experience may influence pain perception and show large inter-individual differences that may also be affected by time variables.  Patient instructions provided during this appointment: Patient Instructions    ____________________________________________________________________________________________  Appointment Policy Summary  It is our goal and responsibility to provide the medical community with assistance in the evaluation and management of patients with chronic pain. Unfortunately our resources are limited. Because we do not have an unlimited amount of time, or available appointments, we are required to closely monitor and manage their use. The following rules exist to maximize their use:  Patient's responsibilities: 1. Punctuality:  At what time should I arrive? You should be physically present in our office 30 minutes before  your scheduled appointment. Your scheduled appointment is with your assigned healthcare provider. However, it takes 5-10 minutes to be "checked-in", and another 15 minutes for the nurses to do the admission. If you arrive to our office at the time you were given for your appointment, you will end up being at least 20-25 minutes late to your appointment with the provider. 2. Tardiness:  What happens if I arrive only a few minutes after my scheduled appointment time? You will need to reschedule your appointment. The cutoff is your appointment time. This is why it is so important that you arrive at least 30 minutes before that appointment. If you have an appointment scheduled for 10:00 AM and you arrive at 10:01, you will be required to reschedule your appointment.  3. Plan ahead:  Always assume that you will encounter traffic on your way in. Plan for it. If you are dependent on a driver, make sure they understand these rules and the need to arrive early. 4. Other appointments and responsibilities:  Avoid scheduling any other appointments before or after your pain clinic appointments.  5. Be prepared:  Write down everything that you need to discuss with your healthcare provider and give this information to the admitting nurse. Write down the medications that you will need  refilled. Bring your pills and bottles (even the empty ones), to all of your appointments, except for those where a procedure is scheduled. 6. No children or pets:  Find someone to take care of them. It is not appropriate to bring them in. 7. Scheduling changes:  We request "advanced notification" of any changes or cancellations. 8. Advanced notification:  Defined as a time period of more than 24 hours prior to the originally scheduled appointment. This allows for the appointment to be offered to other patients. 9. Rescheduling:  When a visit is rescheduled, it will require the cancellation of the original appointment. For this reason they both fall within the category of "Cancellations".  10. Cancellations:  They require advanced notification. Any cancellation less than 24 hours before the  appointment will be recorded as a "No Show". 11. No Show:  Defined as an unkept appointment where the patient failed to notify or declare to the practice their intention or inability to keep the appointment.  Corrective process for repeat offenders:  1. Tardiness: Three (3) episodes of rescheduling due to late arrivals will be recorded as one (1) "No Show". 2. Cancellation or reschedule: Three (3) cancellations or rescheduling will be recorded as one (1) "No Show". 3. "No Shows": Three (3) "No Shows" within a 12 month period will result in discharge from the practice. ____________________________________________________________________________________________  ____________________________________________________________________________________________  Pain Scale  Introduction: The pain score used by this practice is the Verbal Numerical Rating Scale (VNRS-11). This is an 11-point scale. It is for adults and children 10 years or older. There are significant differences in how the pain score is reported, used, and applied. Forget everything you learned in the past and learn this scoring system.  General  Information: The scale should reflect your current level of pain. Unless you are specifically asked for the level of your worst pain, or your average pain. If you are asked for one of these two, then it should be understood that it is over the past 24 hours.  Basic Activities of Daily Living (ADL): Personal hygiene, dressing, eating, transferring, and using restroom.  Instructions: Most patients tend to report their level of pain as a combination of two factors, their physical  pain and their psychosocial pain. This last one is also known as "suffering" and it is reflection of how physical pain affects you socially and psychologically. From now on, report them separately. From this point on, when asked to report your pain level, report only your physical pain. Use the following table for reference.  Pain Clinic Pain Levels (0-5/10)  Pain Level Score  Description  No Pain 0   Mild pain 1 Nagging, annoying, but does not interfere with basic activities of daily living (ADL). Patients are able to eat, bathe, get dressed, toileting (being able to get on and off the toilet and perform personal hygiene functions), transfer (move in and out of bed or a chair without assistance), and maintain continence (able to control bladder and bowel functions). Blood pressure and heart rate are unaffected. A normal heart rate for a healthy adult ranges from 60 to 100 bpm (beats per minute).   Mild to moderate pain 2 Noticeable and distracting. Impossible to hide from other people. More frequent flare-ups. Still possible to adapt and function close to normal. It can be very annoying and may have occasional stronger flare-ups. With discipline, patients may get used to it and adapt.   Moderate pain 3 Interferes significantly with activities of daily living (ADL). It becomes difficult to feed, bathe, get dressed, get on and off the toilet or to perform personal hygiene functions. Difficult to get in and out of bed or a chair  without assistance. Very distracting. With effort, it can be ignored when deeply involved in activities.   Moderately severe pain 4 Impossible to ignore for more than a few minutes. With effort, patients may still be able to manage work or participate in some social activities. Very difficult to concentrate. Signs of autonomic nervous system discharge are evident: dilated pupils (mydriasis); mild sweating (diaphoresis); sleep interference. Heart rate becomes elevated (>115 bpm). Diastolic blood pressure (lower number) rises above 100 mmHg. Patients find relief in laying down and not moving.   Severe pain 5 Intense and extremely unpleasant. Associated with frowning face and frequent crying. Pain overwhelms the senses.  Ability to do any activity or maintain social relationships becomes significantly limited. Conversation becomes difficult. Pacing back and forth is common, as getting into a comfortable position is nearly impossible. Pain wakes you up from deep sleep. Physical signs will be obvious: pupillary dilation; increased sweating; goosebumps; brisk reflexes; cold, clammy hands and feet; nausea, vomiting or dry heaves; loss of appetite; significant sleep disturbance with inability to fall asleep or to remain asleep. When persistent, significant weight loss is observed due to the complete loss of appetite and sleep deprivation.  Blood pressure and heart rate becomes significantly elevated. Caution: If elevated blood pressure triggers a pounding headache associated with blurred vision, then the patient should immediately seek attention at an urgent or emergency care unit, as these may be signs of an impending stroke.    Emergency Department Pain Levels (6-10/10)  Emergency Room Pain 6 Severely limiting. Requires emergency care and should not be seen or managed at an outpatient pain management facility. Communication becomes difficult and requires great effort. Assistance to reach the emergency department  may be required. Facial flushing and profuse sweating along with potentially dangerous increases in heart rate and blood pressure will be evident.   Distressing pain 7 Self-care is very difficult. Assistance is required to transport, or use restroom. Assistance to reach the emergency department will be required. Tasks requiring coordination, such as bathing and getting dressed  become very difficult.   Disabling pain 8 Self-care is no longer possible. At this level, pain is disabling. The individual is unable to do even the most "basic" activities such as walking, eating, bathing, dressing, transferring to a bed, or toileting. Fine motor skills are lost. It is difficult to think clearly.   Incapacitating pain 9 Pain becomes incapacitating. Thought processing is no longer possible. Difficult to remember your own name. Control of movement and coordination are lost.   The worst pain imaginable 10 At this level, most patients pass out from pain. When this level is reached, collapse of the autonomic nervous system occurs, leading to a sudden drop in blood pressure and heart rate. This in turn results in a temporary and dramatic drop in blood flow to the brain, leading to a loss of consciousness. Fainting is one of the body's self defense mechanisms. Passing out puts the brain in a calmed state and causes it to shut down for a while, in order to begin the healing process.    Summary: 1. Refer to this scale when providing [place] with your pain level. 2. Be accurate and careful when reporting your pain level. This will help with your care. 3. Over-reporting your pain level will lead to loss of credibility. 4. Even a level of 1/10 means that there is pain and will be treated at our facility. 5. High, inaccurate reporting will be documented as "Symptom Exaggeration", leading to loss of credibility and suspicions of possible secondary gains such as obtaining more narcotics, or wanting to appear disabled, for  fraudulent reasons. 6. Only pain levels of 5 or below will be seen at our facility. 7. Pain levels of 6 and above will be sent to the Emergency Department and the appointment cancelled. ____________________________________________________________________________________________

## 2017-06-10 NOTE — Progress Notes (Signed)
Results were reviewed and found to be: mildly abnormal  No acute injury or pathology identified  Review would suggest interventional pain management techniques may be of benefit

## 2017-06-10 NOTE — Progress Notes (Signed)
Results were reviewed and found to be:  abnormal  No acute injury or pathology identified  Review would suggest interventional pain management techniques may be of benefit

## 2017-06-10 NOTE — Progress Notes (Signed)
Results were reviewed and found to be: abnormal  No acute injury or pathology identified  Review would suggest interventional pain management techniques may be of benefit

## 2017-06-13 LAB — COMP. METABOLIC PANEL (12)
ALBUMIN: 4.1 g/dL (ref 3.6–4.8)
ALK PHOS: 128 IU/L — AB (ref 39–117)
AST: 25 IU/L (ref 0–40)
Albumin/Globulin Ratio: 1.8 (ref 1.2–2.2)
BUN/Creatinine Ratio: 8 — ABNORMAL LOW (ref 12–28)
BUN: 5 mg/dL — ABNORMAL LOW (ref 8–27)
Bilirubin Total: 0.6 mg/dL (ref 0.0–1.2)
Calcium: 9.6 mg/dL (ref 8.7–10.3)
Chloride: 95 mmol/L — ABNORMAL LOW (ref 96–106)
Creatinine, Ser: 0.62 mg/dL (ref 0.57–1.00)
GFR calc Af Amer: 113 mL/min/{1.73_m2} (ref >59)
GFR calc non Af Amer: 98 mL/min/{1.73_m2} (ref >59)
GLUCOSE: 94 mg/dL (ref 65–99)
Globulin, Total: 2.3 g/dL (ref 1.5–4.5)
Potassium: 4 mmol/L (ref 3.5–5.2)
SODIUM: 137 mmol/L (ref 134–144)
TOTAL PROTEIN: 6.4 g/dL (ref 6.0–8.5)

## 2017-06-13 LAB — VITAMIN B12: VITAMIN B 12: 948 pg/mL (ref 232–1245)

## 2017-06-13 LAB — SEDIMENTATION RATE: Sed Rate: 16 mm/hr (ref 0–40)

## 2017-06-13 LAB — 25-HYDROXYVITAMIN D LCMS D2+D3: 25-HYDROXY, VITAMIN D-2: 1.3 ng/mL

## 2017-06-13 LAB — 25-HYDROXY VITAMIN D LCMS D2+D3
25-Hydroxy, Vitamin D-3: 40 ng/mL
25-Hydroxy, Vitamin D: 41 ng/mL

## 2017-06-13 LAB — COMPLIANCE DRUG ANALYSIS, UR

## 2017-06-13 LAB — C-REACTIVE PROTEIN: CRP: 4.7 mg/L (ref 0.0–4.9)

## 2017-06-13 LAB — MAGNESIUM: Magnesium: 2 mg/dL (ref 1.6–2.3)

## 2017-06-26 ENCOUNTER — Encounter: Payer: Self-pay | Admitting: *Deleted

## 2017-06-27 ENCOUNTER — Ambulatory Visit: Payer: Medicare Other | Admitting: Registered Nurse

## 2017-06-27 ENCOUNTER — Ambulatory Visit
Admission: RE | Admit: 2017-06-27 | Discharge: 2017-06-27 | Disposition: A | Discharge status: Home / Self Care | Payer: Medicare Other | Source: Ambulatory Visit | Attending: Gastroenterology | Admitting: Gastroenterology

## 2017-06-27 ENCOUNTER — Encounter
Admission: RE | Disposition: A | Discharge status: Home / Self Care | Payer: Self-pay | Source: Ambulatory Visit | Attending: Gastroenterology

## 2017-06-27 DIAGNOSIS — F329 Major depressive disorder, single episode, unspecified: Secondary | ICD-10-CM | POA: Insufficient documentation

## 2017-06-27 DIAGNOSIS — K7460 Unspecified cirrhosis of liver: Secondary | ICD-10-CM | POA: Diagnosis present

## 2017-06-27 DIAGNOSIS — Z885 Allergy status to narcotic agent status: Secondary | ICD-10-CM | POA: Insufficient documentation

## 2017-06-27 DIAGNOSIS — I110 Hypertensive heart disease with heart failure: Secondary | ICD-10-CM | POA: Diagnosis not present

## 2017-06-27 DIAGNOSIS — E1142 Type 2 diabetes mellitus with diabetic polyneuropathy: Secondary | ICD-10-CM | POA: Diagnosis not present

## 2017-06-27 DIAGNOSIS — Z89421 Acquired absence of other right toe(s): Secondary | ICD-10-CM | POA: Insufficient documentation

## 2017-06-27 DIAGNOSIS — Z6841 Body Mass Index (BMI) 40.0 and over, adult: Secondary | ICD-10-CM | POA: Insufficient documentation

## 2017-06-27 DIAGNOSIS — Z9049 Acquired absence of other specified parts of digestive tract: Secondary | ICD-10-CM | POA: Insufficient documentation

## 2017-06-27 DIAGNOSIS — K2960 Other gastritis without bleeding: Secondary | ICD-10-CM | POA: Insufficient documentation

## 2017-06-27 DIAGNOSIS — K589 Irritable bowel syndrome without diarrhea: Secondary | ICD-10-CM | POA: Diagnosis not present

## 2017-06-27 DIAGNOSIS — K210 Gastro-esophageal reflux disease with esophagitis: Secondary | ICD-10-CM | POA: Diagnosis not present

## 2017-06-27 DIAGNOSIS — I509 Heart failure, unspecified: Secondary | ICD-10-CM | POA: Diagnosis not present

## 2017-06-27 DIAGNOSIS — K2950 Unspecified chronic gastritis without bleeding: Secondary | ICD-10-CM | POA: Insufficient documentation

## 2017-06-27 DIAGNOSIS — K7581 Nonalcoholic steatohepatitis (NASH): Secondary | ICD-10-CM | POA: Insufficient documentation

## 2017-06-27 DIAGNOSIS — E785 Hyperlipidemia, unspecified: Secondary | ICD-10-CM | POA: Diagnosis not present

## 2017-06-27 DIAGNOSIS — Z7984 Long term (current) use of oral hypoglycemic drugs: Secondary | ICD-10-CM | POA: Insufficient documentation

## 2017-06-27 DIAGNOSIS — Z86010 Personal history of colonic polyps: Secondary | ICD-10-CM | POA: Insufficient documentation

## 2017-06-27 DIAGNOSIS — M797 Fibromyalgia: Secondary | ICD-10-CM | POA: Diagnosis not present

## 2017-06-27 DIAGNOSIS — Z79899 Other long term (current) drug therapy: Secondary | ICD-10-CM | POA: Diagnosis not present

## 2017-06-27 HISTORY — DX: Unspecified abdominal hernia without obstruction or gangrene: K46.9

## 2017-06-27 HISTORY — PX: ESOPHAGOGASTRODUODENOSCOPY (EGD) WITH PROPOFOL: SHX5813

## 2017-06-27 LAB — GLUCOSE, CAPILLARY: GLUCOSE-CAPILLARY: 115 mg/dL — AB (ref 65–99)

## 2017-06-27 SURGERY — ESOPHAGOGASTRODUODENOSCOPY (EGD) WITH PROPOFOL
Anesthesia: General

## 2017-06-27 MED ORDER — PROPOFOL 10 MG/ML IV BOLUS
INTRAVENOUS | Status: DC | PRN
  Administered 2017-06-27: 80 mg via INTRAVENOUS

## 2017-06-27 MED ORDER — SODIUM CHLORIDE 0.9 % IV SOLN
INTRAVENOUS | Status: DC
  Administered 2017-06-27: 1000 mL via INTRAVENOUS

## 2017-06-27 MED ORDER — LIDOCAINE HCL (CARDIAC) 20 MG/ML IV SOLN
INTRAVENOUS | Status: DC | PRN
  Administered 2017-06-27: 100 mg via INTRAVENOUS

## 2017-06-27 MED ORDER — GLYCOPYRROLATE 0.2 MG/ML IJ SOLN
INTRAMUSCULAR | Status: DC | PRN
  Administered 2017-06-27: 0.2 mg via INTRAVENOUS

## 2017-06-27 MED ORDER — PROPOFOL 500 MG/50ML IV EMUL
INTRAVENOUS | Status: AC
  Filled 2017-06-27: qty 50

## 2017-06-27 MED ORDER — LIDOCAINE HCL (PF) 2 % IJ SOLN
INTRAMUSCULAR | Status: AC
  Filled 2017-06-27: qty 10

## 2017-06-27 MED ORDER — SODIUM CHLORIDE 0.9 % IV SOLN: INTRAVENOUS | Status: DC

## 2017-06-27 MED ORDER — PHENYLEPHRINE HCL 10 MG/ML IJ SOLN
INTRAMUSCULAR | Status: DC | PRN
  Administered 2017-06-27: 100 ug via INTRAVENOUS

## 2017-06-27 MED ORDER — PHENYLEPHRINE HCL 10 MG/ML IJ SOLN
INTRAMUSCULAR | Status: AC
  Filled 2017-06-27: qty 1

## 2017-06-27 MED ORDER — PROPOFOL 500 MG/50ML IV EMUL
INTRAVENOUS | Status: DC | PRN
  Administered 2017-06-27: 200 ug/kg/min via INTRAVENOUS

## 2017-06-27 MED ORDER — GLYCOPYRROLATE 0.2 MG/ML IJ SOLN
INTRAMUSCULAR | Status: AC
  Filled 2017-06-27: qty 1

## 2017-06-27 NOTE — Op Note (Signed)
[facility] Gastroenterology Patient Name: [name] Procedure Date: 06/27/2017 6:57 AM MRN: [id number] Account #: [id number] Date of Birth: 01/18/1956 Admit Type: Outpatient Age: 62 Room: [facility] ENDO ROOM 3 Gender: Female Note Status: Finalized Procedure:            Upper GI endoscopy Indications:          Cirrhosis rule out esophageal varices Providers:            Lollie Sails, MD Referring MD:         Shively, MD (Referring MD) Medicines:            Monitored Anesthesia Care Complications:        No immediate complications. Procedure:            Pre-Anesthesia Assessment:                       - ASA Grade Assessment: III - A patient with severe                        systemic disease.                       After obtaining informed consent, the endoscope was                        passed under direct vision. Throughout the procedure,                        the patient's blood pressure, pulse, and oxygen                        saturations were monitored continuously. The Endoscope                        was introduced through the mouth, and advanced to the                        third part of duodenum. The upper GI endoscopy was                        accomplished without difficulty. The patient tolerated                        the procedure well. Findings:      The Z-line was variable.      no evidence of esophageal or gastric varices.      Diffuse moderate inflammation characterized by adherent blood,       congestion (edema), erythema and friability was found in the entire       examined stomach. Biopsies were taken with a cold forceps for histology.       Biopsies were taken with a cold forceps for Helicobacter pylori testing.      The cardia and gastric fundus were normal on retroflexion otherwise.      Mildly friable mucosa without active bleeding was found in the entire       duodenum.      The Z-line was variable.  Biopsies were taken with a cold forceps for       histology. Impression:           - Z-line variable.                       -  Bile gastritis. Biopsied.                       - Friable duodenal mucosa.                       - Z-line variable. Biopsied. Recommendation:       - Use Protonix (pantoprazole) 40 mg PO daily daily.                       - Return to GI clinic in 4 weeks. Procedure Code(s):    --- Professional ---                       [phone], Esophagogastroduodenoscopy, flexible, transoral;                        with biopsy, single or multiple Diagnosis Code(s):    --- Professional ---                       K22.8, Other specified diseases of esophagus                       K29.60, Other gastritis without bleeding                       K31.89, Other diseases of stomach and duodenum                       K74.60, Unspecified cirrhosis of liver CPT copyright 2016 American Medical Association. All rights reserved. The codes documented in this report are preliminary and upon coder review may  be revised to meet current compliance requirements. Lollie Sails, MD 06/27/2017 7:57:42 AM This report has been signed electronically. Number of Addenda: 0 Note Initiated On: 06/27/2017 6:57 AM      [facility]

## 2017-06-27 NOTE — Anesthesia Post-op Follow-up Note (Signed)
Anesthesia QCDR form completed.        

## 2017-06-27 NOTE — Anesthesia Postprocedure Evaluation (Signed)
Anesthesia Post Note  Patient: [name]  Procedure(s) Performed: ESOPHAGOGASTRODUODENOSCOPY (EGD) WITH PROPOFOL (N/A )  Patient location during evaluation: PACU Anesthesia Type: General Level of consciousness: awake and alert Pain management: pain level controlled Vital Signs Assessment: post-procedure vital signs reviewed and stable Respiratory status: spontaneous breathing, nonlabored ventilation, respiratory function stable and patient connected to nasal cannula oxygen Cardiovascular status: blood pressure returned to baseline and stable Postop Assessment: no apparent nausea or vomiting Anesthetic complications: no     Last Vitals:  Vitals:   06/27/17 0804 06/27/17 0814  BP: 107/61 (!) 105/55  Pulse:    Resp:  (!) 22  Temp:    SpO2:      Last Pain:  Vitals:   06/27/17 0815  TempSrc:   PainSc: 0-No pain                 Molli Barrows

## 2017-06-27 NOTE — Anesthesia Preprocedure Evaluation (Signed)
Anesthesia Evaluation  Patient identified by MRN, date of birth, ID band Patient awake    Reviewed: Allergy & Precautions, H&P , NPO status , Patient's Chart, lab work & pertinent test results, reviewed documented beta blocker date and time   Airway Mallampati: II   Neck ROM: full    Dental  (+) Poor Dentition   Pulmonary neg pulmonary ROS, sleep apnea and Continuous Positive Airway Pressure Ventilation ,    Pulmonary exam normal        Cardiovascular Exercise Tolerance: Poor hypertension, On Medications + angina with exertion +CHF  negative cardio ROS Normal cardiovascular exam Rhythm:regular Rate:Normal     Neuro/Psych PSYCHIATRIC DISORDERS Depression  Neuromuscular disease negative neurological ROS  negative psych ROS   GI/Hepatic negative GI ROS, Neg liver ROS, (+) Cirrhosis       ,   Endo/Other  negative endocrine ROSdiabetes, Well Controlled, Type 2, Oral Hypoglycemic AgentsMorbid obesity  Renal/GU negative Renal ROS  negative genitourinary   Musculoskeletal   Abdominal   Peds  Hematology negative hematology ROS (+) anemia ,   Anesthesia Other Findings Past Medical History: No date: Abdominal hernia No date: Allergy No date: Anemia No date: Anginal pain (HCC) No date: CHF (congestive heart failure) (HCC) No date: Colonic polyp ddd: DDD (degenerative disc disease), cervical No date: Depression No date: Diabetes mellitus without complication (HCC) No date: Fibromyalgia No date: Gout No date: Hyperlipidemia No date: Hypertension No date: IBS (irritable bowel syndrome) No date: Liver cirrhosis ([place]) No date: Polyneuropathy Past Surgical History: No date: CERVICAL DISC SURGERY 3220,2542: CESAREAN SECTION 2015: CHOLECYSTECTOMY 06/10/2013: COLONOSCOPY W/ BIOPSIES AND POLYPECTOMY No date: ESOPHAGOGASTRODUODENOSCOPY 2015: HERNIA REPAIR No date: KNEE CARTILAGE SURGERY; Right No date: partial  amputation     Comment:  toe surgery No date: SPINE SURGERY 2015: TOE AMPUTATION; Right     Comment:  partial amputation 3rd toe 1987: TONSILLECTOMY No date: UMBILICAL HERNIA REPAIR BMI    Body Mass Index:  42.43 kg/m     Reproductive/Obstetrics negative OB ROS                             Anesthesia Physical Anesthesia Plan  ASA: III  Anesthesia Plan: General   Post-op Pain Management:    Induction:   PONV Risk Score and Plan:   Airway Management Planned:   Additional Equipment:   Intra-op Plan:   Post-operative Plan:   Informed Consent: I have reviewed the patients History and Physical, chart, labs and discussed the procedure including the risks, benefits and alternatives for the proposed anesthesia with the patient or authorized representative who has indicated his/her understanding and acceptance.   Dental Advisory Given  Plan Discussed with: CRNA  Anesthesia Plan Comments:         Anesthesia Quick Evaluation

## 2017-06-27 NOTE — H&P (Signed)
Outpatient short stay form Pre-procedure 06/27/2017 7:29 AM Lollie Sails MD  Primary Physician: Ronna Polio NP  Reason for visit: EGD  History of present illness: Patient is a 62 year old female with a personal history of cirrhosis secondary to [place].  He is presenting today for evaluation regards possible varices.  Her last EGD was 06/10/2013 this being negative for varices at that time.  Her last pro time INR was 1.0 her last platelet count was 166.  She is a meld 6.  She takes no blood thinning agents or aspirin products.    Current Facility-Administered Medications:  .  0.9 %  sodium chloride infusion, , Intravenous, Continuous, Lollie Sails, MD, Last Rate: 20 mL/hr at 06/27/17 0701, 1,000 mL at 06/27/17 0701 .  0.9 %  sodium chloride infusion, , Intravenous, Continuous, Lollie Sails, MD  Medications Prior to Admission  Medication Sig Dispense Refill Last Dose  . acetaminophen (TYLENOL) 500 MG tablet Take 500 mg by mouth every 6 (six) hours as needed.   06/26/2017 at Unknown time  . buPROPion (WELLBUTRIN XL) 300 MG 24 hr tablet Take 300 mg by mouth daily.   06/26/2017 at Unknown time  . Cholecalciferol (VITAMIN D3) 1000 UNITS CAPS Take by mouth.   Past Week at Unknown time  . citalopram (CELEXA) 40 MG tablet Take 40 mg by mouth daily.   06/26/2017 at Unknown time  . furosemide (LASIX) 20 MG tablet Take 20 mg by mouth 2 (two) times daily.   06/26/2017 at Unknown time  . lactulose (CHRONULAC) 10 GM/15ML solution    06/26/2017 at Unknown time  . meloxicam (MOBIC) 15 MG tablet Take 15 mg by mouth daily.   06/26/2017 at Unknown time  . metFORMIN (GLUCOPHAGE) 500 MG tablet   1 06/26/2017 at Unknown time  . methocarbamol (ROBAXIN) 750 MG tablet Take 750 mg by mouth 3 (three) times daily.   06/26/2017 at Unknown time  . risperiDONE (RISPERDAL) 2 MG tablet Take 2 mg by mouth 2 (two) times daily.   06/26/2017 at Unknown time  . spironolactone (ALDACTONE) 25 MG tablet    06/26/2017  at Unknown time  . XIFAXAN 550 MG TABS tablet    06/26/2017 at Unknown time     Allergies  Allergen Reactions  . Vicodin [Hydrocodone-Acetaminophen] Other (See Comments) and Rash    nightmares nightmares Per patient she crawls all over the bed     Past Medical History:  Diagnosis Date  . Abdominal hernia   . Allergy   . Anemia   . Anginal pain ([place])   . CHF (congestive heart failure) ([place])   . Colonic polyp   . DDD (degenerative disc disease), cervical ddd  . Depression   . Diabetes mellitus without complication ([place])   . Fibromyalgia   . Gout   . Hyperlipidemia   . Hypertension   . IBS (irritable bowel syndrome)   . Liver cirrhosis ([place])   . Polyneuropathy     Review of systems:      Physical Exam    Heart and lungs: Without rub or gallop, lungs are bilaterally clear.    HEENT: Normocephalic atraumatic eyes are anicteric    Other:    Pertinant exam for procedure: Soft nontender, though mild generalized discomfort nondistended bowel sounds positive normoactive.  There are no masses rebound.    Planned proceedures: EGD and indicated procedures. I have discussed the risks benefits and complications of procedures to include not limited to bleeding, infection, perforation and  the risk of sedation and the patient wishes to proceed.    Lollie Sails, MD Gastroenterology 06/27/2017  7:29 AM

## 2017-06-27 NOTE — Transfer of Care (Signed)
Immediate Anesthesia Transfer of Care Note  Patient: [name]  Procedure(s) Performed: ESOPHAGOGASTRODUODENOSCOPY (EGD) WITH PROPOFOL (N/A )  Patient Location: PACU  Anesthesia Type:General  Level of Consciousness: awake  Airway & Oxygen Therapy: Patient Spontanous Breathing  Post-op Assessment: Report given to RN  Post vital signs: stable  Last Vitals:  Vitals Value Taken Time  BP 108/55 06/27/2017  7:57 AM  Temp 36.1 C 06/27/2017  7:57 AM  Pulse 74 06/27/2017  7:57 AM  Resp 17 06/27/2017  7:57 AM  SpO2 98 % 06/27/2017  7:57 AM  Vitals shown include unvalidated device data.  Last Pain:  Vitals:   06/27/17 0757  TempSrc: Temporal  PainSc:          Complications: No apparent anesthesia complications

## 2017-06-30 ENCOUNTER — Encounter: Payer: Self-pay | Admitting: Gastroenterology

## 2017-06-30 LAB — SURGICAL PATHOLOGY

## 2017-07-13 NOTE — Progress Notes (Signed)
Patient's Name: [name]  MRN: [id number]  Referring Provider: [facility]*  DOB: 1955-04-29  PCP: [facility] [place]: 07/14/2017  Note by: Jenna Cola, MD  Service setting: Ambulatory outpatient  Specialty: Interventional Pain Management  Location: [facility] (AMB) Pain Management Facility    Patient type: Established   Primary Reason(s) for Visit: Encounter for evaluation before starting new chronic pain management plan of care (Level of risk: moderate) CC: Back Pain (lower bilateral); Knee Pain (right); and Neck Pain  HPI  [name] is a 62 y.o. year old, female patient, who comes today for a follow-up evaluation to review the test results and decide on a treatment plan. She has Hepatic cirrhosis ([name]); Polyp of colon; DJD (degenerative joint disease) of knee; Diabetes mellitus ([name]); Fibromyalgia; Cirrhosis of liver ([name]); Chronic pain of right knee (Primary Area of Pain); Chronic bilateral low back pain without sciatica (Secondary Area of Pain) (R>L); Chronic neck pain (Tertiary Area of Pain); Chronic pain of both shoulders (Fourth Area of Pain) (R>L); Chronic pain syndrome; Pharmacologic therapy; Disorder of skeletal system; Problems influencing health status; Osteoarthritis of knee (Right); and Tricompartment osteoarthritis of knee (Right) on their problem list. Her primarily concern today is the Back Pain (lower bilateral); Knee Pain (right); and Neck Pain  Pain Assessment: Location: Lower, Left, Right Back(see visit information for additional pain sites. ) Radiating: denies Onset: More than a month ago Duration: Chronic pain Quality: Discomfort, Constant, Burning, Nagging Severity: 8 /10 (self-reported pain score)  Note: Reported level is compatible with observation.                         When using our objective Pain Scale, levels between 6 and 10/10 are said to belong in an emergency room, as it progressively worsens from a 6/10, described as  severely limiting, requiring emergency care not usually available at an outpatient pain management facility. At a 6/10 level, communication becomes difficult and requires great effort. Assistance to reach the emergency department may be required. Facial flushing and profuse sweating along with potentially dangerous increases in heart rate and blood pressure will be evident. Effect on ADL: makes days difficult, difficulty getting household chores completed  Timing: Constant Modifying factors: sleeping  [name] comes in today for a follow-up visit after her initial evaluation on 06/09/2017. Today we went over the results of her tests. These were explained in "Layman's terms". During today's appointment we went over my diagnostic impression, as well as the proposed treatment plan.  According to the patient's daughter her primary area of pain is in her right knee (R). She then agrees. She is somewhat a poor historian. She admits that she has had meniscus repair several years ago. He has also had steroid injections orthopedist. She admits that she did have physical therapy. She denies any recent images.  Her second area of pain is in her lower back (B) (R>L). She admits that the small midline. She denies any radiating pain. She denies any previous surgeries, interventional therapy, physical therapy or recent images.  Her third area of pain is in her neck (B) (R=L). He admits that this is secondary to a fall. He has suffered multiple falls in the past. She denies any upper extremity involvement She did undergo C4-5 fusion 2004. He admits that the surgery wasn't effective. She denies any interventional therapy, recent physical therapy or recent images.  Her fourth area of pain is in her shoulder  right being greater than the left (B) (R>L). She admits that this is related to a fall. She denies any numbness, tingling or weakness in her upper arms. She denies any previous interventional therapy or physical  therapy.  In considering the treatment plan options, [name] was reminded that I no longer take patients for medication management only. I asked her to let me know if she had no intention of taking advantage of the interventional therapies, so that we could make arrangements to provide this space to someone interested. I also made it clear that undergoing interventional therapies for the purpose of getting pain medications is very inappropriate on the part of a patient, and it will not be tolerated in this practice. This type of behavior would suggest true addiction and therefore it requires referral to an addiction specialist.   Further details on both, my assessment(s), as well as the proposed treatment plan, please see below.  Controlled Substance Pharmacotherapy Assessment REMS (Risk Evaluation and Mitigation Strategy)  Analgesic: None Highest recorded MME/day: 120 mg/day MME/day: 0 mg/day Medications: The patient  Pill Count: None expected due to no prior prescriptions written by our practice. No notes on file Pharmacokinetics: Liberation and absorption (onset of action): WNL Distribution (time to peak effect): WNL Metabolism and excretion (duration of action): WNL         Pharmacodynamics: Desired effects: Analgesia: [name] reports >50% benefit. Functional ability: Patient reports that medication allows her to accomplish basic ADLs Clinically meaningful improvement in function (CMIF): Sustained CMIF goals met Perceived effectiveness: Described as relatively effective, allowing for increase in activities of daily living (ADL) Undesirable effects: Side-effects or Adverse reactions: None reported Monitoring: [place] PMP: Online review of the past 55-monthperiod previously conducted. Not applicable at this point since we have not taken over the patient's medication management yet. List of other Serum/Urine Drug Screening Test(s):  Lab Results  Component Value Date    COCAINSCRNUR NEGATIVE 06/22/2011   COCAINSCRNUR NEGATIVE 05/25/2011   THCU NEGATIVE 06/22/2011   THCU NEGATIVE 05/25/2011   List of all UDS test(s) done:  Lab Results  Component Value Date   SUMMARY FINAL 06/09/2017   Last UDS on record: Summary  Date Value Ref Range Status  06/09/2017 FINAL  Final    Comment:    ==================================================================== TOXASSURE COMP DRUG ANALYSIS,UR ==================================================================== Specimen Alert Note:  Urinary creatinine is very low; ability to detect some drugs may be compromised; creatinine-normalized drug concentrations should be interpreted with caution. Suggest recollection. ==================================================================== Test                             Result       Flag       Units Drug Present and Declared for Prescription Verification   Methocarbamol                  PRESENT      EXPECTED   Guaifenesin                    PRESENT      EXPECTED    Guaifenesin may be administered as an over-the-counter or    prescription drug; it may also be present as a breakdown product    of methocarbamol.   Bupropion                      PRESENT      EXPECTED   Hydroxybupropion  PRESENT      EXPECTED    Hydroxybupropion is an expected metabolite of bupropion.   Citalopram                     PRESENT      EXPECTED   Desmethylcitalopram            PRESENT      EXPECTED    Desmethylcitalopram is an expected metabolite of citalopram or    the enantiomeric form, escitalopram.   Risperidone                    PRESENT      EXPECTED   Acetaminophen                  PRESENT      EXPECTED ==================================================================== Test                      Result    Flag   Units      Ref Range   Creatinine              7         L      mg/dL      >=20 ==================================================================== Declared  Medications:  The flagging and interpretation on this report are based on the  following declared medications.  Unexpected results may arise from  inaccuracies in the declared medications.  **Note: The testing scope of this panel includes these medications:  Bupropion  Citalopram  Methocarbamol  Risperidone  **Note: The testing scope of this panel does not include small to  moderate amounts of these reported medications:  Acetaminophen  **Note: The testing scope of this panel does not include following  reported medications:  Furosemide  Lactulose  Metformin  Rifaximin  Spironolactone  Vitamin D3 ==================================================================== For clinical consultation, please call [phone]. ====================================================================    UDS interpretation: No unexpected findings.          Medication Assessment Form: Patient introduced to form today Treatment compliance: Treatment may start today if patient agrees with proposed plan. Evaluation of compliance is not applicable at this point Risk Assessment Profile: Aberrant behavior: See initial evaluations. None observed or detected today Comorbid factors increasing risk of overdose: See initial evaluation. No additional risks detected today Medical Psychology Evaluation: Please see scanned results in medical record. Opioid Risk Tool - 07/14/17 1205      Psychological Disease   Depression  -- patient taking celexia, wellbutrin and respiradone and reports this is helping   patient taking celexia, wellbutrin and respiradone and reports this is helping     ORT Scoring interpretation table:  Score <3 = Low Risk for SUD  Score between 4-7 = Moderate Risk for SUD  Score >8 = High Risk for Opioid Abuse   Risk Mitigation Strategies:  Patient opioid safety counseling: Completed today. Counseling provided to patient as per "Patient Counseling Document". Document signed by patient,  attesting to counseling and understanding Patient-Prescriber Agreement (PPA): Obtained today.  Controlled substance notification to other providers: Written and sent today.  Pharmacologic Plan: Today we may be taking over the patient's pharmacological regimen. See below.             Laboratory Chemistry  Inflammation Markers (CRP: Acute Phase) (ESR: Chronic Phase) Lab Results  Component Value Date   CRP 4.7 06/09/2017   ESRSEDRATE 16 06/09/2017  Renal Function Markers Lab Results  Component Value Date   BUN 5 (L) 06/09/2017   CREATININE 0.62 06/09/2017   GFRAA 113 06/09/2017   GFRNONAA 98 06/09/2017                              Hepatic Function Markers Lab Results  Component Value Date   AST 25 06/09/2017   ALT 18 05/26/2013   ALBUMIN 4.1 06/09/2017   ALKPHOS 128 (H) 06/09/2017                        Electrolytes Lab Results  Component Value Date   NA 137 06/09/2017   K 4.0 06/09/2017   CL 95 (L) 06/09/2017   CALCIUM 9.6 06/09/2017   MG 2.0 06/09/2017                        Neuropathy Markers Lab Results  Component Value Date   VITAMINB12 948 06/09/2017                        Bone Pathology Markers Lab Results  Component Value Date   25OHVITD1 41 06/09/2017   25OHVITD2 1.3 06/09/2017   25OHVITD3 40 06/09/2017                         Coagulation Parameters Lab Results  Component Value Date   INR 1.2 09/21/2013   LABPROT 14.7 09/21/2013   APTT 33.2 09/09/2013   PLT 253 05/26/2013                        Cardiovascular Markers Lab Results  Component Value Date   TROPONINI < 0.02 05/26/2013   HGB 11.7 (L) 05/26/2013   HCT 35.0 05/26/2013                         Note: Lab results reviewed.  Recent Diagnostic Imaging Review  Cervical Imaging: Cervical DG 2-3 views:  Results for orders placed in visit on 05/31/02  DG Cervical Spine 2-3 Views   Narrative FINDINGS CLINICAL DATA:  C5-6 DISK HERNIATION. CERVICAL SPINE - TWO  VIEWS TWO INTRAOPERATIVE CROSS TABLE LATERAL RADIOGRAPHS OF THE CERVICAL SPINE WERE OBTAINED INTRAOPERATIVELY.  THESE SHOW PLACEMENT OF AN INTERBODY BONE PLUG AND ANTERIOR FIXATION PLATE AND SCREWS AT W0-9.  ALIGNMENT OF THE CERVICAL SPINE IS NORMAL. IMPRESSION ANTERIOR CERVICAL SPINE FUSION AT C5-6 WITH NORMAL ALIGNMENT. FLUOROSCOPY FLUOROSCOPY WAS UTILIZED BY THE REQUESTING PHYSICIAN. IMPRESSION NO RADIOGRAPHIC INTERPRETATION.   Cervical DG Bending/F/E views:  Results for orders placed during the hospital encounter of 06/09/17  DG Cervical Spine With Flex & Extend   Narrative CLINICAL DATA:  Chronic neck and bilateral shoulder pain. History of prior cervical fusion.  EXAM: CERVICAL SPINE COMPLETE WITH FLEXION AND EXTENSION VIEWS  COMPARISON:  None.  FINDINGS: The patient is status post C5-6 ACDF. The level is solidly fused and hardware is intact. No pathologic motion is seen with flexion or extension. Scattered loss of disc space height and endplate spurring are worst at C4-5 and C6-7. No fracture. Prevertebral soft tissues appear normal.  IMPRESSION: Advanced degenerative disc disease C4-5. Somewhat milder degree of degenerative disc disease C6-7 is also seen.  Status post solid C5-6 ACDF.   Electronically Signed   By: Inge Rise M.D.  On: 06/10/2017 08:21    Shoulder Imaging: Shoulder-R DG:  Results for orders placed during the hospital encounter of 06/09/17  DG Shoulder Right   Narrative CLINICAL DATA:  Chronic right shoulder pain.  No known injury.  EXAM: RIGHT SHOULDER - 2+ VIEW  COMPARISON:  None.  FINDINGS: No acute bony or joint abnormality is seen. The glenohumeral joint is unremarkable. Mild acromioclavicular osteoarthritis is noted. There appears to be a right pleural effusion and basilar airspace disease.  IMPRESSION: Mild acromioclavicular osteoarthritis. The shoulder is otherwise unremarkable.  Likely right pleural effusion and  basilar airspace disease. Finding could be further evaluated with PA and lateral views of the chest.   Electronically Signed   By: Inge Rise M.D.   On: 06/10/2017 08:24    Shoulder-L DG:  Results for orders placed during the hospital encounter of 06/09/17  DG Shoulder Left   Narrative CLINICAL DATA:  Chronic left shoulder pain.  No known injury.  EXAM: LEFT SHOULDER - 2+ VIEW  COMPARISON:  None.  FINDINGS: No acute bony or joint abnormality is seen. Mild to moderate acromioclavicular osteoarthritis is noted. The glenohumeral joint appears normal. Imaged lung parenchyma and ribs are unremarkable.  IMPRESSION: Mild to moderate acromioclavicular osteoarthritis. Otherwise negative.   Electronically Signed   By: Inge Rise M.D.   On: 06/10/2017 08:25    Lumbosacral Imaging: Lumbar DG Bending views:  Results for orders placed during the hospital encounter of 06/09/17  DG Lumbar Spine Complete W/Bend   Narrative CLINICAL DATA:  Chronic low back pain.  No known injury.  EXAM: LUMBAR SPINE - COMPLETE WITH BENDING VIEWS  COMPARISON:  None.  FINDINGS: No fracture is identified. There is straightening of the normal lumbar lordosis. Severe convex right scoliosis is seen with the apex at L4. Severe loss of disc space height is present at L2-3, L3-4 and L4-5. Scattered facet arthropathy appears worst at L5-S1. Paraspinous structures are unremarkable.  IMPRESSION: No acute abnormality.  Marked convex right scoliosis and severe multilevel degenerative disc disease.   Electronically Signed   By: Inge Rise M.D.   On: 06/10/2017 08:22    Knee Imaging: Knee-R DG 1-2 views:  Results for orders placed during the hospital encounter of 06/09/17  DG Knee 1-2 Views Right   Narrative CLINICAL DATA:  Chronic right knee pain.  No known injury.  EXAM: RIGHT KNEE - 1-2 VIEW  COMPARISON:  None.  FINDINGS: No acute bony or joint abnormality is identified.  The patient has severe osteoarthritis about the knee which is worst in the medial compartment where there is bone-on-bone joint space narrowing. No joint effusion.  IMPRESSION: No acute finding.  Severe tricompartmental osteoarthritis is worst medially.   Electronically Signed   By: Inge Rise M.D.   On: 06/10/2017 08:23    Foot Imaging: Foot-R DG Complete:  Results for orders placed in visit on 09/29/14  DG Foot Complete Right   Narrative 3 views of a skeletally mature individual were obtained of the right foot.  Study includes AP, oblique, lateral projections.  There is arthritic changes noted of the hallux IPJ there is hammertoe  contractures present. There is no definitive evidence of acute  osteomyelitis at this time. There is no soft tissue emphysema. No evidence  of acute fracture or stress fracture identified at this time.   Foot-L DG Complete:  Results for orders placed in visit on 03/17/13  DG Foot Complete Left   Narrative 3 views of the left foot demonstrates  severe osteoarthritis midfoot.  Severe osteoarthritis with pes planus barefoot. Very large cystic lesion  to the base of the first metatarsal at the cuneiform joint. Severe  hammertoe deformities are noted. Osteopenia is also noted.   Complexity Note: Imaging results reviewed. Results shared with Ms. Minna Antis, using Layman's terms.                         Meds   Current Outpatient Medications:  .  acetaminophen (TYLENOL) 500 MG tablet, Take 500 mg by mouth every 6 (six) hours as needed., Disp: , Rfl:  .  buPROPion (WELLBUTRIN XL) 300 MG 24 hr tablet, Take 300 mg by mouth daily., Disp: , Rfl:  .  Cholecalciferol (VITAMIN D3) 1000 UNITS CAPS, Take by mouth., Disp: , Rfl:  .  citalopram (CELEXA) 40 MG tablet, Take 40 mg by mouth daily., Disp: , Rfl:  .  furosemide (LASIX) 20 MG tablet, Take 20 mg by mouth 2 (two) times daily., Disp: , Rfl:  .  lactulose (CHRONULAC) 10 GM/15ML solution, , Disp: ,  Rfl:  .  meloxicam (MOBIC) 15 MG tablet, Take 15 mg by mouth daily., Disp: , Rfl:  .  metFORMIN (GLUCOPHAGE) 500 MG tablet, , Disp: , Rfl: 1 .  methocarbamol (ROBAXIN) 750 MG tablet, Take 750 mg by mouth 3 (three) times daily., Disp: , Rfl:  .  pantoprazole (PROTONIX) 40 MG tablet, Take 40 mg by mouth daily., Disp: , Rfl:  .  risperiDONE (RISPERDAL) 2 MG tablet, Take 2 mg by mouth 2 (two) times daily., Disp: , Rfl:  .  spironolactone (ALDACTONE) 25 MG tablet, Take 25 mg by mouth daily. , Disp: , Rfl:  .  XIFAXAN 550 MG TABS tablet, 550 mg 2 (two) times daily. , Disp: , Rfl:   ROS  Constitutional: Denies any fever or chills Gastrointestinal: No reported hemesis, hematochezia, vomiting, or acute GI distress Musculoskeletal: Denies any acute onset joint swelling, redness, loss of ROM, or weakness Neurological: No reported episodes of acute onset apraxia, aphasia, dysarthria, agnosia, amnesia, paralysis, loss of coordination, or loss of consciousness  Allergies  Ms. Wahba is allergic to vicodin [hydrocodone-acetaminophen].  [place]  Drug: Ms. Arechiga  reports that she does not use drugs. Alcohol:  reports that she does not drink alcohol. Tobacco:  reports that she has never smoked. She has never used smokeless tobacco. Medical:  has a past medical history of Abdominal hernia, Allergy, Anemia, Anginal pain ([place]), CHF (congestive heart failure) ([place]), Colonic polyp, DDD (degenerative disc disease), cervical (ddd), Depression, Diabetes mellitus without complication ([place]), Fibromyalgia, Gout, Hyperlipidemia, Hypertension, IBS (irritable bowel syndrome), Liver cirrhosis ([place]), and Polyneuropathy. Surgical: Ms. Demarais  has a past surgical history that includes Cesarean section [phone]); Spine surgery; Tonsillectomy (1987); Toe amputation (Right, 2015); Hernia repair (2015); Cholecystectomy (2015); Cervical disc surgery; Colonoscopy w/ biopsies and polypectomy (06/10/2013);  Esophagogastroduodenoscopy; Knee cartilage surgery (Right); partial amputation; Umbilical hernia repair; and Esophagogastroduodenoscopy (egd) with propofol (N/A, 06/27/2017). Family: family history includes Alcohol abuse in her father and mother; Arthritis in her mother; Colon polyps in her sister; Depression in her father and mother; Diabetes in her mother; Heart disease in her maternal aunt; Hypertension in her mother; Mental illness in her father and mother.  Constitutional Exam  General appearance: Well nourished, well developed, and well hydrated. In no apparent acute distress Vitals:   07/14/17 1158  BP: (!) 117/46  Pulse: 71  Resp: 16  Temp: 97.6 F (36.4 C)  TempSrc: Oral  SpO2: 98%  Weight: 260 lb (117.9 kg)  Height: 5' 5"  (1.651 m)   BMI Assessment: Estimated body mass index is 43.27 kg/m as calculated from the following:   Height as of this encounter: 5' 5"  (1.651 m).   Weight as of this encounter: 260 lb (117.9 kg).  BMI interpretation table: BMI level Category Range association with higher incidence of chronic pain  <18 kg/m2 Underweight   18.5-24.9 kg/m2 Ideal body weight   25-29.9 kg/m2 Overweight Increased incidence by 20%  30-34.9 kg/m2 Obese (Class I) Increased incidence by 68%  35-39.9 kg/m2 Severe obesity (Class II) Increased incidence by 136%  >40 kg/m2 Extreme obesity (Class III) Increased incidence by 254%   BMI Readings from Last 4 Encounters:  07/14/17 43.27 kg/m  06/27/17 42.43 kg/m  06/09/17 42.77 kg/m  09/21/15 42.77 kg/m   Wt Readings from Last 4 Encounters:  07/14/17 260 lb (117.9 kg)  06/27/17 255 lb (115.7 kg)  09/21/15 257 lb (116.6 kg)  09/12/15 258 lb 6.4 oz (117.2 kg)  Psych/Mental status: Alert, oriented x 3 (person, place, & time)       Eyes: PERLA Respiratory: No evidence of acute respiratory distress  Cervical Spine Area Exam  Skin & Axial Inspection: No masses, redness, edema, swelling, or associated skin lesions Alignment:  Symmetrical Functional ROM: Unrestricted ROM      Stability: No instability detected Muscle Tone/Strength: Functionally intact. No obvious neuro-muscular anomalies detected. Sensory (Neurological): Unimpaired Palpation: No palpable anomalies              Upper Extremity (UE) Exam    Side: Right upper extremity  Side: Left upper extremity  Skin & Extremity Inspection: Skin color, temperature, and hair growth are WNL. No peripheral edema or cyanosis. No masses, redness, swelling, asymmetry, or associated skin lesions. No contractures.  Skin & Extremity Inspection: Skin color, temperature, and hair growth are WNL. No peripheral edema or cyanosis. No masses, redness, swelling, asymmetry, or associated skin lesions. No contractures.  Functional ROM: Unrestricted ROM          Functional ROM: Unrestricted ROM          Muscle Tone/Strength: Functionally intact. No obvious neuro-muscular anomalies detected.  Muscle Tone/Strength: Functionally intact. No obvious neuro-muscular anomalies detected.  Sensory (Neurological): Unimpaired          Sensory (Neurological): Unimpaired          Palpation: No palpable anomalies              Palpation: No palpable anomalies              Specialized Test(s): Deferred         Specialized Test(s): Deferred          Thoracic Spine Area Exam  Skin & Axial Inspection: No masses, redness, or swelling Alignment: Symmetrical Functional ROM: Unrestricted ROM Stability: No instability detected Muscle Tone/Strength: Functionally intact. No obvious neuro-muscular anomalies detected. Sensory (Neurological): Unimpaired Muscle strength & Tone: No palpable anomalies  Lumbar Spine Area Exam  Skin & Axial Inspection: No masses, redness, or swelling Alignment: Symmetrical Functional ROM: Unrestricted ROM      Stability: No instability detected Muscle Tone/Strength: Functionally intact. No obvious neuro-muscular anomalies detected. Sensory (Neurological): Unimpaired Palpation:  No palpable anomalies       Provocative Tests: Lumbar Hyperextension and rotation test: evaluation deferred today       Lumbar Lateral bending test: evaluation deferred today       Patrick's Maneuver: evaluation deferred today  Gait & Posture Assessment  Ambulation: Patient ambulates using a wheel chair Gait: Very limited, using assistive device to ambulate Posture: Difficulty standing up straight, due to pain   Lower Extremity Exam    Side: Right lower extremity  Side: Left lower extremity  Skin & Extremity Inspection: Skin color, temperature, and hair growth are WNL. No peripheral edema or cyanosis. No masses, redness, swelling, asymmetry, or associated skin lesions. No contractures.  Skin & Extremity Inspection: Skin color, temperature, and hair growth are WNL. No peripheral edema or cyanosis. No masses, redness, swelling, asymmetry, or associated skin lesions. No contractures.  Functional ROM: Unrestricted ROM          Functional ROM: Unrestricted ROM          Muscle Tone/Strength: Functionally intact. No obvious neuro-muscular anomalies detected.  Muscle Tone/Strength: Functionally intact. No obvious neuro-muscular anomalies detected.  Sensory (Neurological): Unimpaired  Sensory (Neurological): Unimpaired  Palpation: No palpable anomalies  Palpation: No palpable anomalies   Assessment & Plan  Primary Diagnosis & Pertinent Problem List: The primary encounter diagnosis was Chronic pain of right knee (Primary Area of Pain). Diagnoses of Osteoarthritis of knee (Right) and Tricompartment osteoarthritis of knee (Right) were also pertinent to this visit.  Visit Diagnosis: 1. Chronic pain of right knee (Primary Area of Pain)   2. Osteoarthritis of knee (Right)   3. Tricompartment osteoarthritis of knee (Right)    Problems updated and reviewed during this visit: Problem  Osteoarthritis of knee (Right)  Tricompartment osteoarthritis of knee (Right)    Plan of Care   Pharmacotherapy (Medications Ordered): No orders of the defined types were placed in this encounter.   Procedure Orders     KNEE INJECTION Lab Orders  No laboratory test(s) ordered today   Imaging Orders  No imaging studies ordered today   Referral Orders  No referral(s) requested today    Pharmacological management options:  Opioid Analgesics: We'll take over management today. See above orders Membrane stabilizer: We have discussed the possibility of optimizing this mode of therapy, if tolerated Muscle relaxant: We have discussed the possibility of a trial NSAID: We have discussed the possibility of a trial Other analgesic(s): To be determined at a later time   Interventional management options: Planned, scheduled, and/or pending:    Diagnostic right intra-articular knee joint injection #1 with local anesthetic and steroid.  No fluoroscopy or IV sedation.   Considering:   Diagnostic right intra-articular knee injection Hyalgan series Diagnostic right genicular nerve block Possible right genicular nerve rate frequency ablation Diagnostic bilateral lumbar facet nerve block Possible bilateral lumbar facet RFA Diagnostic bilateral cervical facet nerve block Diagnostic bilateral facet RFA Diagnostic bilateral intra-articular shoulder injections    PRN Procedures:   None at this time   Provider-requested follow-up: Return for Procedure (no sedation): (R) IA Knee inj. (Steroids).  No future appointments.  Primary Care Physician: [facility] Location: [facility] Note by: Jenna Cola, MD Date: 07/14/2017; Time: 1:52 PM

## 2017-07-14 ENCOUNTER — Encounter: Payer: Self-pay | Admitting: Pain Medicine

## 2017-07-14 ENCOUNTER — Ambulatory Visit: Payer: Medicare Other | Attending: Pain Medicine | Admitting: Pain Medicine

## 2017-07-14 VITALS — BP 117/46 | HR 71 | Temp 97.6°F | Resp 16 | Ht 65.0 in | Wt 260.0 lb

## 2017-07-14 DIAGNOSIS — I509 Heart failure, unspecified: Secondary | ICD-10-CM | POA: Diagnosis not present

## 2017-07-14 DIAGNOSIS — Z7984 Long term (current) use of oral hypoglycemic drugs: Secondary | ICD-10-CM | POA: Diagnosis not present

## 2017-07-14 DIAGNOSIS — M797 Fibromyalgia: Secondary | ICD-10-CM | POA: Insufficient documentation

## 2017-07-14 DIAGNOSIS — Z86010 Personal history of colonic polyps: Secondary | ICD-10-CM | POA: Insufficient documentation

## 2017-07-14 DIAGNOSIS — Z89421 Acquired absence of other right toe(s): Secondary | ICD-10-CM | POA: Insufficient documentation

## 2017-07-14 DIAGNOSIS — Z981 Arthrodesis status: Secondary | ICD-10-CM | POA: Insufficient documentation

## 2017-07-14 DIAGNOSIS — G8929 Other chronic pain: Secondary | ICD-10-CM | POA: Diagnosis not present

## 2017-07-14 DIAGNOSIS — M25561 Pain in right knee: Secondary | ICD-10-CM

## 2017-07-14 DIAGNOSIS — Z79899 Other long term (current) drug therapy: Secondary | ICD-10-CM | POA: Insufficient documentation

## 2017-07-14 DIAGNOSIS — E119 Type 2 diabetes mellitus without complications: Secondary | ICD-10-CM | POA: Insufficient documentation

## 2017-07-14 DIAGNOSIS — M5030 Other cervical disc degeneration, unspecified cervical region: Secondary | ICD-10-CM | POA: Insufficient documentation

## 2017-07-14 DIAGNOSIS — M1711 Unilateral primary osteoarthritis, right knee: Secondary | ICD-10-CM | POA: Diagnosis not present

## 2017-07-14 DIAGNOSIS — E785 Hyperlipidemia, unspecified: Secondary | ICD-10-CM | POA: Insufficient documentation

## 2017-07-14 DIAGNOSIS — M419 Scoliosis, unspecified: Secondary | ICD-10-CM | POA: Diagnosis not present

## 2017-07-14 DIAGNOSIS — I110 Hypertensive heart disease with heart failure: Secondary | ICD-10-CM | POA: Diagnosis not present

## 2017-07-14 DIAGNOSIS — K589 Irritable bowel syndrome without diarrhea: Secondary | ICD-10-CM | POA: Insufficient documentation

## 2017-07-14 DIAGNOSIS — F329 Major depressive disorder, single episode, unspecified: Secondary | ICD-10-CM | POA: Diagnosis not present

## 2017-07-14 DIAGNOSIS — Z885 Allergy status to narcotic agent status: Secondary | ICD-10-CM | POA: Diagnosis not present

## 2017-07-14 DIAGNOSIS — Z9889 Other specified postprocedural states: Secondary | ICD-10-CM | POA: Insufficient documentation

## 2017-07-14 NOTE — Patient Instructions (Addendum)
____________________________________________________________________________________________  Pain Scale  Introduction: The pain score used by this practice is the Verbal Numerical Rating Scale (VNRS-11). This is an 11-point scale. It is for adults and children 10 years or older. There are significant differences in how the pain score is reported, used, and applied. Forget everything you learned in the past and learn this scoring system.  General Information: The scale should reflect your current level of pain. Unless you are specifically asked for the level of your worst pain, or your average pain. If you are asked for one of these two, then it should be understood that it is over the past 24 hours.  Basic Activities of Daily Living (ADL): Personal hygiene, dressing, eating, transferring, and using restroom.  Instructions: Most patients tend to report their level of pain as a combination of two factors, their physical pain and their psychosocial pain. This last one is also known as "suffering" and it is reflection of how physical pain affects you socially and psychologically. From now on, report them separately. From this point on, when asked to report your pain level, report only your physical pain. Use the following table for reference.  Pain Clinic Pain Levels (0-5/10)  Pain Level Score  Description  No Pain 0   Mild pain 1 Nagging, annoying, but does not interfere with basic activities of daily living (ADL). Patients are able to eat, bathe, get dressed, toileting (being able to get on and off the toilet and perform personal hygiene functions), transfer (move in and out of bed or a chair without assistance), and maintain continence (able to control bladder and bowel functions). Blood pressure and heart rate are unaffected. A normal heart rate for a healthy adult ranges from 60 to 100 bpm (beats per minute).   Mild to moderate pain 2 Noticeable and distracting. Impossible to hide from other  people. More frequent flare-ups. Still possible to adapt and function close to normal. It can be very annoying and may have occasional stronger flare-ups. With discipline, patients may get used to it and adapt.   Moderate pain 3 Interferes significantly with activities of daily living (ADL). It becomes difficult to feed, bathe, get dressed, get on and off the toilet or to perform personal hygiene functions. Difficult to get in and out of bed or a chair without assistance. Very distracting. With effort, it can be ignored when deeply involved in activities.   Moderately severe pain 4 Impossible to ignore for more than a few minutes. With effort, patients may still be able to manage work or participate in some social activities. Very difficult to concentrate. Signs of autonomic nervous system discharge are evident: dilated pupils (mydriasis); mild sweating (diaphoresis); sleep interference. Heart rate becomes elevated (>115 bpm). Diastolic blood pressure (lower number) rises above 100 mmHg. Patients find relief in laying down and not moving.   Severe pain 5 Intense and extremely unpleasant. Associated with frowning face and frequent crying. Pain overwhelms the senses.  Ability to do any activity or maintain social relationships becomes significantly limited. Conversation becomes difficult. Pacing back and forth is common, as getting into a comfortable position is nearly impossible. Pain wakes you up from deep sleep. Physical signs will be obvious: pupillary dilation; increased sweating; goosebumps; brisk reflexes; cold, clammy hands and feet; nausea, vomiting or dry heaves; loss of appetite; significant sleep disturbance with inability to fall asleep or to remain asleep. When persistent, significant weight loss is observed due to the complete loss of appetite and sleep deprivation.  Blood  pressure and heart rate becomes significantly elevated. Caution: If elevated blood pressure triggers a pounding headache  associated with blurred vision, then the patient should immediately seek attention at an urgent or emergency care unit, as these may be signs of an impending stroke.    Emergency Department Pain Levels (6-10/10)  Emergency Room Pain 6 Severely limiting. Requires emergency care and should not be seen or managed at an outpatient pain management facility. Communication becomes difficult and requires great effort. Assistance to reach the emergency department may be required. Facial flushing and profuse sweating along with potentially dangerous increases in heart rate and blood pressure will be evident.   Distressing pain 7 Self-care is very difficult. Assistance is required to transport, or use restroom. Assistance to reach the emergency department will be required. Tasks requiring coordination, such as bathing and getting dressed become very difficult.   Disabling pain 8 Self-care is no longer possible. At this level, pain is disabling. The individual is unable to do even the most "basic" activities such as walking, eating, bathing, dressing, transferring to a bed, or toileting. Fine motor skills are lost. It is difficult to think clearly.   Incapacitating pain 9 Pain becomes incapacitating. Thought processing is no longer possible. Difficult to remember your own name. Control of movement and coordination are lost.   The worst pain imaginable 10 At this level, most patients pass out from pain. When this level is reached, collapse of the autonomic nervous system occurs, leading to a sudden drop in blood pressure and heart rate. This in turn results in a temporary and dramatic drop in blood flow to the brain, leading to a loss of consciousness. Fainting is one of the body's self defense mechanisms. Passing out puts the brain in a calmed state and causes it to shut down for a while, in order to begin the healing process.    Summary: 1. Refer to this scale when providing [place] with your pain level. 2. Be  accurate and careful when reporting your pain level. This will help with your care. 3. Over-reporting your pain level will lead to loss of credibility. 4. Even a level of 1/10 means that there is pain and will be treated at our facility. 5. High, inaccurate reporting will be documented as "Symptom Exaggeration", leading to loss of credibility and suspicions of possible secondary gains such as obtaining more narcotics, or wanting to appear disabled, for fraudulent reasons. 6. Only pain levels of 5 or below will be seen at our facility. 7. Pain levels of 6 and above will be sent to the Emergency Department and the appointment cancelled. ____________________________________________________________________________________________   ____________________________________________________________________________________________  Preparing for your procedure (without sedation)  Instructions: . Oral Intake: Do not eat or drink anything for at least 3 hours prior to your procedure. . Transportation: Unless otherwise stated by your physician, you may drive yourself after the procedure. . Blood Pressure Medicine: Take your blood pressure medicine with a sip of water the morning of the procedure. . Blood thinners:  . Diabetics on insulin: Notify the staff so that you can be scheduled 1st case in the morning. If your diabetes requires high dose insulin, take only  of your normal insulin dose the morning of the procedure and notify the staff that you have done so. . Preventing infections: Shower with an antibacterial soap the morning of your procedure.  . Build-up your immune system: Take 1000 mg of Vitamin C with every meal (3 times a day) the day prior to  your procedure. [place] Antibiotics: Inform the staff if you have a condition or reason that requires you to take antibiotics before dental procedures. . Pregnancy: If you are pregnant, call and cancel the procedure. . Sickness: If you have a cold, fever, or any  active infections, call and cancel the procedure. . Arrival: You must be in the facility at least 30 minutes prior to your scheduled procedure. . Children: Do not bring any children with you. . Dress appropriately: Bring dark clothing that you would not mind if they get stained. . Valuables: Do not bring any jewelry or valuables.  Procedure appointments are reserved for interventional treatments only. [place] No Prescription Refills. . No medication changes will be discussed during procedure appointments. . No disability issues will be discussed.  Remember:  Regular Business hours are:  Monday to Thursday 8:00 AM to 4:00 PM  Provider's Schedule: Milinda Pointer, MD:  Procedure days: Tuesday and Thursday 7:30 AM to 4:00 PM  Gillis Santa, MD:  Procedure days: Monday and Wednesday 7:30 AM to 4:00 PM ____________________________________________________________________________________________

## 2017-07-16 NOTE — Progress Notes (Signed)
Patient's Name: [name]  MRN: [id number]  Referring Provider: [facility]  DOB: [birth year]  PCP: [facility]: 07/14/2017  Note by: Gaspar Cola, MD  Service setting: Ambulatory outpatient  Specialty: Interventional Pain Management  Location: [facility] (AMB) Pain Management Facility    Patient type: Established   Primary Reason(s) for Visit: Encounter for evaluation before starting new chronic pain management plan of care (Level of risk: moderate) CC: Back Pain (lower bilateral); Knee Pain (right); and Neck Pain  HPI  [name] is a 62 y.o. year old, female patient, who comes today for a follow-up evaluation to review the test results and decide on a treatment plan. She has Hepatic cirrhosis ([place]); Polyp of colon; DJD (degenerative joint disease) of knee; Diabetes mellitus ([place]); Fibromyalgia; Cirrhosis of liver ([place]); Chronic knee pain (Primary Area of Pain) (Right); Chronic bilateral low back pain without sciatica (Secondary Area of Pain) (R>L); Chronic neck pain (Tertiary Area of Pain); Chronic pain of both shoulders (Fourth Area of Pain) (R>L); Chronic pain syndrome; Pharmacologic therapy; Disorder of skeletal system; Problems influencing health status; Osteoarthritis of knee (Right); and Tricompartment osteoarthritis of knee (Right) on their problem list. Her primarily concern today is the Back Pain (lower bilateral); Knee Pain (right); and Neck Pain  Pain Assessment: Location: Lower, Left, Right Back(see visit information for additional pain sites. ) Radiating: denies Onset: More than a month ago Duration: Chronic pain Quality: Discomfort, Constant, Burning, Nagging Severity: 8 /10 (self-reported pain score)  Note: Reported level is inconsistent with clinical observations. Clinically the patient looks like a 3/10 A 3/10 is viewed as "Moderate" and described as significantly interfering with activities of daily living (ADL). It becomes  difficult to feed, bathe, get dressed, get on and off the toilet or to perform personal hygiene functions. Difficult to get in and out of bed or a chair without assistance. Very distracting. With effort, it can be ignored when deeply involved in activities. Information on the proper use of the pain scale provided to the patient today. When using our objective Pain Scale, levels between 6 and 10/10 are said to belong in an emergency room, as it progressively worsens from a 6/10, described as severely limiting, requiring emergency care not usually available at an outpatient pain management facility. At a 6/10 level, communication becomes difficult and requires great effort. Assistance to reach the emergency department may be required. Facial flushing and profuse sweating along with potentially dangerous increases in heart rate and blood pressure will be evident. Effect on ADL: makes days difficult, difficulty getting household chores completed  Timing: Constant Modifying factors: sleeping  [name] comes in today for a follow-up visit after her initial evaluation on 06/09/2017. Today we went over the results of her tests. These were explained in "Layman's terms". During today's appointment we went over my diagnostic impression, as well as the proposed treatment plan.  According to the patient's daughter her primary area of pain is in her right knee (R). She then agrees. She is somewhat a poor historian. She admits that she has had meniscus repair several years ago. He has also had steroid injections orthopedist. She admits that she did have physical therapy. She denies any recent images.  Her second area of pain is in her lower back (B) (R>L). She admits that the small midline. She denies any radiating pain. She denies any previous surgeries, interventional therapy, physical therapy or recent images.  Her third area of pain is in her  neck (B) (R=L). He admits that this is secondary to a fall. He has  suffered multiple falls in the past. She denies any upper extremity involvement She did undergo C4-5 fusion 2004. He admits that the surgery wasn't effective. She denies any interventional therapy, recent physical therapy or recent images.  Her fourth area of pain is in her shoulder right being greater than the left (B) (R>L). She admits that this is related to a fall. She denies any numbness, tingling or weakness in her upper arms. She denies any previous interventional therapy or physical therapy.  In considering the treatment plan options, [name] was reminded that I no longer take patients for medication management only. I asked her to let me know if she had no intention of taking advantage of the interventional therapies, so that we could make arrangements to provide this space to someone interested. I also made it clear that undergoing interventional therapies for the purpose of getting pain medications is very inappropriate on the part of a patient, and it will not be tolerated in this practice. This type of behavior would suggest true addiction and therefore it requires referral to an addiction specialist.   Further details on both, my assessment(s), as well as the proposed treatment plan, please see below.  Controlled Substance Pharmacotherapy Assessment REMS (Risk Evaluation and Mitigation Strategy)  Analgesic: None Highest recorded MME/day: 120 mg/day MME/day: 0 mg/day Medications: The patient  Pill Count: None expected due to no prior prescriptions written by our practice. No notes on file Pharmacokinetics: Liberation and absorption (onset of action): WNL Distribution (time to peak effect): WNL Metabolism and excretion (duration of action): WNL         Pharmacodynamics: Desired effects: Analgesia: [name] reports >50% benefit. Functional ability: Patient reports that medication allows her to accomplish basic ADLs Clinically meaningful improvement in function (CMIF):  Sustained CMIF goals met Perceived effectiveness: Described as relatively effective, allowing for increase in activities of daily living (ADL) Undesirable effects: Side-effects or Adverse reactions: None reported Monitoring: [place] PMP: Online review of the past 23-monthperiod previously conducted. Not applicable at this point since we have not taken over the patient's medication management yet. List of other Serum/Urine Drug Screening Test(s):  Lab Results  Component Value Date   COCAINSCRNUR NEGATIVE 06/22/2011   COCAINSCRNUR NEGATIVE 05/25/2011   THCU NEGATIVE 06/22/2011   THCU NEGATIVE 05/25/2011   List of all UDS test(s) done:  Lab Results  Component Value Date   SUMMARY FINAL 06/09/2017   Last UDS on record: Summary  Date Value Ref Range Status  06/09/2017 FINAL  Final    Comment:    ==================================================================== [facility] COMP DRUG ANALYSIS,UR ==================================================================== Specimen Alert Note:  Urinary creatinine is very low; ability to detect some drugs may be compromised; creatinine-normalized drug concentrations should be interpreted with caution. Suggest recollection. ==================================================================== Test                             Result       Flag       Units Drug Present and Declared for Prescription Verification   Methocarbamol                  PRESENT      EXPECTED   Guaifenesin                    PRESENT      EXPECTED    Guaifenesin may  be administered as an over-the-counter or    prescription drug; it may also be present as a breakdown product    of methocarbamol.   Bupropion                      PRESENT      EXPECTED   Hydroxybupropion               PRESENT      EXPECTED    Hydroxybupropion is an expected metabolite of bupropion.   Citalopram                     PRESENT      EXPECTED   Desmethylcitalopram            PRESENT      EXPECTED     Desmethylcitalopram is an expected metabolite of citalopram or    the enantiomeric form, escitalopram.   Risperidone                    PRESENT      EXPECTED   Acetaminophen                  PRESENT      EXPECTED ==================================================================== Test                      Result    Flag   Units      Ref Range   Creatinine              7         L      mg/dL      >=20 ==================================================================== Declared Medications:  The flagging and interpretation on this report are based on the  following declared medications.  Unexpected results may arise from  inaccuracies in the declared medications.  **Note: The testing scope of this panel includes these medications:  Bupropion  Citalopram  Methocarbamol  Risperidone  **Note: The testing scope of this panel does not include small to  moderate amounts of these reported medications:  Acetaminophen  **Note: The testing scope of this panel does not include following  reported medications:  Furosemide  Lactulose  Metformin  Rifaximin  Spironolactone  Vitamin D3 ==================================================================== For clinical consultation, please call [phone]. ====================================================================    UDS interpretation: No unexpected findings.          Medication Assessment Form: Patient introduced to form today Treatment compliance: Treatment may start today if patient agrees with proposed plan. Evaluation of compliance is not applicable at this point Risk Assessment Profile: Aberrant behavior: See initial evaluations. None observed or detected today Comorbid factors increasing risk of overdose: See initial evaluation. No additional risks detected today Medical Psychology Evaluation: Please see scanned results in medical record. Opioid Risk Tool - 07/14/17 1205      Psychological Disease   Depression  --  patient taking celexia, wellbutrin and respiradone and reports this is helping   patient taking celexia, wellbutrin and respiradone and reports this is helping     ORT Scoring interpretation table:  Score <3 = Low Risk for SUD  Score between 4-7 = Moderate Risk for SUD  Score >8 = High Risk for Opioid Abuse   Risk Mitigation Strategies:  Patient opioid safety counseling: Completed today. Counseling provided to patient as per "Patient Counseling Document". Document signed by patient, attesting to counseling and understanding Patient-Prescriber Agreement (PPA): Obtained today.  Controlled substance notification  to other providers: Written and sent today.  Pharmacologic Plan: Today we may be taking over the patient's pharmacological regimen. See below.             Laboratory Chemistry  Inflammation Markers (CRP: Acute Phase) (ESR: Chronic Phase) Lab Results  Component Value Date   CRP 4.7 06/09/2017   ESRSEDRATE 16 06/09/2017                         Renal Function Markers Lab Results  Component Value Date   BUN 5 (L) 06/09/2017   CREATININE 0.62 06/09/2017   GFRAA 113 06/09/2017   GFRNONAA 98 06/09/2017                              Hepatic Function Markers Lab Results  Component Value Date   AST 25 06/09/2017   ALT 18 05/26/2013   ALBUMIN 4.1 06/09/2017   ALKPHOS 128 (H) 06/09/2017                        Electrolytes Lab Results  Component Value Date   NA 137 06/09/2017   K 4.0 06/09/2017   CL 95 (L) 06/09/2017   CALCIUM 9.6 06/09/2017   MG 2.0 06/09/2017                        Neuropathy Markers Lab Results  Component Value Date   VITAMINB12 948 06/09/2017                        Bone Pathology Markers Lab Results  Component Value Date   25OHVITD1 41 06/09/2017   25OHVITD2 1.3 06/09/2017   25OHVITD3 40 06/09/2017                         Coagulation Parameters Lab Results  Component Value Date   INR 1.2 09/21/2013   LABPROT 14.7 09/21/2013   APTT  33.2 09/09/2013   PLT 253 05/26/2013                        Cardiovascular Markers Lab Results  Component Value Date   TROPONINI < 0.02 05/26/2013   HGB 11.7 (L) 05/26/2013   HCT 35.0 05/26/2013                         Note: Lab results reviewed.  Recent Diagnostic Imaging Review  Cervical Imaging: Cervical DG 2-3 views:  Results for orders placed in visit on 05/31/02  DG Cervical Spine 2-3 Views   Narrative FINDINGS CLINICAL DATA:  C5-6 DISK HERNIATION. CERVICAL SPINE - TWO VIEWS TWO INTRAOPERATIVE CROSS TABLE LATERAL RADIOGRAPHS OF THE CERVICAL SPINE WERE OBTAINED INTRAOPERATIVELY.  THESE SHOW PLACEMENT OF AN INTERBODY BONE PLUG AND ANTERIOR FIXATION PLATE AND SCREWS AT Y6-0.  ALIGNMENT OF THE CERVICAL SPINE IS NORMAL. IMPRESSION ANTERIOR CERVICAL SPINE FUSION AT C5-6 WITH NORMAL ALIGNMENT. FLUOROSCOPY FLUOROSCOPY WAS UTILIZED BY THE REQUESTING PHYSICIAN. IMPRESSION NO RADIOGRAPHIC INTERPRETATION.   Cervical DG Bending/F/E views:  Results for orders placed during the hospital encounter of 06/09/17  DG Cervical Spine With Flex & Extend   Narrative CLINICAL DATA:  Chronic neck and bilateral shoulder pain. History of prior cervical fusion.  EXAM: CERVICAL SPINE COMPLETE WITH FLEXION AND EXTENSION VIEWS  COMPARISON:  None.  FINDINGS: The patient is status post C5-6 ACDF. The level is solidly fused and hardware is intact. No pathologic motion is seen with flexion or extension. Scattered loss of disc space height and endplate spurring are worst at C4-5 and C6-7. No fracture. Prevertebral soft tissues appear normal.  IMPRESSION: Advanced degenerative disc disease C4-5. Somewhat milder degree of degenerative disc disease C6-7 is also seen.  Status post solid C5-6 ACDF.   Electronically Signed   By: Inge Rise M.D.   On: 06/10/2017 08:21    Shoulder Imaging: Shoulder-R DG:  Results for orders placed during the hospital encounter of 06/09/17  DG Shoulder  Right   Narrative CLINICAL DATA:  Chronic right shoulder pain.  No known injury.  EXAM: RIGHT SHOULDER - 2+ VIEW  COMPARISON:  None.  FINDINGS: No acute bony or joint abnormality is seen. The glenohumeral joint is unremarkable. Mild acromioclavicular osteoarthritis is noted. There appears to be a right pleural effusion and basilar airspace disease.  IMPRESSION: Mild acromioclavicular osteoarthritis. The shoulder is otherwise unremarkable.  Likely right pleural effusion and basilar airspace disease. Finding could be further evaluated with PA and lateral views of the chest.   Electronically Signed   By: Inge Rise M.D.   On: 06/10/2017 08:24    Shoulder-L DG:  Results for orders placed during the hospital encounter of 06/09/17  DG Shoulder Left   Narrative CLINICAL DATA:  Chronic left shoulder pain.  No known injury.  EXAM: LEFT SHOULDER - 2+ VIEW  COMPARISON:  None.  FINDINGS: No acute bony or joint abnormality is seen. Mild to moderate acromioclavicular osteoarthritis is noted. The glenohumeral joint appears normal. Imaged lung parenchyma and ribs are unremarkable.  IMPRESSION: Mild to moderate acromioclavicular osteoarthritis. Otherwise negative.   Electronically Signed   By: Inge Rise M.D.   On: 06/10/2017 08:25    Lumbosacral Imaging: Lumbar DG Bending views:  Results for orders placed during the hospital encounter of 06/09/17  DG Lumbar Spine Complete W/Bend   Narrative CLINICAL DATA:  Chronic low back pain.  No known injury.  EXAM: LUMBAR SPINE - COMPLETE WITH BENDING VIEWS  COMPARISON:  None.  FINDINGS: No fracture is identified. There is straightening of the normal lumbar lordosis. Severe convex right scoliosis is seen with the apex at L4. Severe loss of disc space height is present at L2-3, L3-4 and L4-5. Scattered facet arthropathy appears worst at L5-S1. Paraspinous structures are unremarkable.  IMPRESSION: No acute  abnormality.  Marked convex right scoliosis and severe multilevel degenerative disc disease.   Electronically Signed   By: Inge Rise M.D.   On: 06/10/2017 08:22    Knee Imaging: Knee-R DG 1-2 views:  Results for orders placed during the hospital encounter of 06/09/17  DG Knee 1-2 Views Right   Narrative CLINICAL DATA:  Chronic right knee pain.  No known injury.  EXAM: RIGHT KNEE - 1-2 VIEW  COMPARISON:  None.  FINDINGS: No acute bony or joint abnormality is identified. The patient has severe osteoarthritis about the knee which is worst in the medial compartment where there is bone-on-bone joint space narrowing. No joint effusion.  IMPRESSION: No acute finding.  Severe tricompartmental osteoarthritis is worst medially.   Electronically Signed   By: Inge Rise M.D.   On: 06/10/2017 08:23    Foot Imaging: Foot-R DG Complete:  Results for orders placed in visit on 09/29/14  DG Foot Complete Right   Narrative 3 views of a skeletally mature individual were obtained of the  right foot.  Study includes AP, oblique, lateral projections.  There is arthritic changes noted of the hallux IPJ there is hammertoe  contractures present. There is no definitive evidence of acute  osteomyelitis at this time. There is no soft tissue emphysema. No evidence  of acute fracture or stress fracture identified at this time.   Foot-L DG Complete:  Results for orders placed in visit on 03/17/13  DG Foot Complete Left   Narrative 3 views of the left foot demonstrates severe osteoarthritis midfoot.  Severe osteoarthritis with pes planus barefoot. Very large cystic lesion  to the base of the first metatarsal at the cuneiform joint. Severe  hammertoe deformities are noted. Osteopenia is also noted.   Complexity Note: Imaging results reviewed. Results shared with Ms. Minna Antis, using Layman's terms.                         Meds   Current Outpatient Medications:  .   acetaminophen (TYLENOL) 500 MG tablet, Take 500 mg by mouth every 6 (six) hours as needed., Disp: , Rfl:  .  buPROPion (WELLBUTRIN XL) 300 MG 24 hr tablet, Take 300 mg by mouth daily., Disp: , Rfl:  .  Cholecalciferol (VITAMIN D3) 1000 UNITS CAPS, Take by mouth., Disp: , Rfl:  .  citalopram (CELEXA) 40 MG tablet, Take 40 mg by mouth daily., Disp: , Rfl:  .  furosemide (LASIX) 20 MG tablet, Take 20 mg by mouth 2 (two) times daily., Disp: , Rfl:  .  lactulose (CHRONULAC) 10 GM/15ML solution, , Disp: , Rfl:  .  meloxicam (MOBIC) 15 MG tablet, Take 15 mg by mouth daily., Disp: , Rfl:  .  metFORMIN (GLUCOPHAGE) 500 MG tablet, , Disp: , Rfl: 1 .  methocarbamol (ROBAXIN) 750 MG tablet, Take 750 mg by mouth 3 (three) times daily., Disp: , Rfl:  .  pantoprazole (PROTONIX) 40 MG tablet, Take 40 mg by mouth daily., Disp: , Rfl:  .  risperiDONE (RISPERDAL) 2 MG tablet, Take 2 mg by mouth 2 (two) times daily., Disp: , Rfl:  .  spironolactone (ALDACTONE) 25 MG tablet, Take 25 mg by mouth daily. , Disp: , Rfl:  .  XIFAXAN 550 MG TABS tablet, 550 mg 2 (two) times daily. , Disp: , Rfl:   ROS  Constitutional: Denies any fever or chills Gastrointestinal: No reported hemesis, hematochezia, vomiting, or acute GI distress Musculoskeletal: Denies any acute onset joint swelling, redness, loss of ROM, or weakness Neurological: No reported episodes of acute onset apraxia, aphasia, dysarthria, agnosia, amnesia, paralysis, loss of coordination, or loss of consciousness  Allergies  Ms. Tellefsen is allergic to vicodin [hydrocodone-acetaminophen].  [place]  Drug: Ms. Marcott  reports that she does not use drugs. Alcohol:  reports that she does not drink alcohol. Tobacco:  reports that she has never smoked. She has never used smokeless tobacco. Medical:  has a past medical history of Abdominal hernia, Allergy, Anemia, Anginal pain ([place]), CHF (congestive heart failure) ([place]), Colonic polyp, DDD (degenerative disc disease),  cervical (ddd), Depression, Diabetes mellitus without complication ([place]), Fibromyalgia, Gout, Hyperlipidemia, Hypertension, IBS (irritable bowel syndrome), Liver cirrhosis ([place]), and Polyneuropathy. Surgical: Ms. Fleener  has a past surgical history that includes Cesarean section [phone]); Spine surgery; Tonsillectomy (1987); Toe amputation (Right, 2015); Hernia repair (2015); Cholecystectomy (2015); Cervical disc surgery; Colonoscopy w/ biopsies and polypectomy (06/10/2013); Esophagogastroduodenoscopy; Knee cartilage surgery (Right); partial amputation; Umbilical hernia repair; and Esophagogastroduodenoscopy (egd) with propofol (N/A, 06/27/2017). Family: family history includes  Alcohol abuse in her father and mother; Arthritis in her mother; Colon polyps in her sister; Depression in her father and mother; Diabetes in her mother; Heart disease in her maternal aunt; Hypertension in her mother; Mental illness in her father and mother.  Constitutional Exam  General appearance: Well nourished, well developed, and well hydrated. In no apparent acute distress Vitals:   07/14/17 1158  BP: (!) 117/46  Pulse: 71  Resp: 16  Temp: 97.6 F (36.4 C)  TempSrc: Oral  SpO2: 98%  Weight: 260 lb (117.9 kg)  Height: 5' 5"  (1.651 m)   BMI Assessment: Estimated body mass index is 43.27 kg/m as calculated from the following:   Height as of this encounter: 5' 5"  (1.651 m).   Weight as of this encounter: 260 lb (117.9 kg).  BMI interpretation table: BMI level Category Range association with higher incidence of chronic pain  <18 kg/m2 Underweight   18.5-24.9 kg/m2 Ideal body weight   25-29.9 kg/m2 Overweight Increased incidence by 20%  30-34.9 kg/m2 Obese (Class I) Increased incidence by 68%  35-39.9 kg/m2 Severe obesity (Class II) Increased incidence by 136%  >40 kg/m2 Extreme obesity (Class III) Increased incidence by 254%   BMI Readings from Last 4 Encounters:  07/14/17 43.27 kg/m  06/27/17 42.43  kg/m  06/09/17 42.77 kg/m  09/21/15 42.77 kg/m   Wt Readings from Last 4 Encounters:  07/14/17 260 lb (117.9 kg)  06/27/17 255 lb (115.7 kg)  09/21/15 257 lb (116.6 kg)  09/12/15 258 lb 6.4 oz (117.2 kg)  Psych/Mental status: Alert, oriented x 3 (person, place, & time)       Eyes: PERLA Respiratory: No evidence of acute respiratory distress  Cervical Spine Area Exam  Skin & Axial Inspection: No masses, redness, edema, swelling, or associated skin lesions Alignment: Symmetrical Functional ROM: Unrestricted ROM      Stability: No instability detected Muscle Tone/Strength: Functionally intact. No obvious neuro-muscular anomalies detected. Sensory (Neurological): Unimpaired Palpation: No palpable anomalies              Upper Extremity (UE) Exam    Side: Right upper extremity  Side: Left upper extremity  Skin & Extremity Inspection: Skin color, temperature, and hair growth are WNL. No peripheral edema or cyanosis. No masses, redness, swelling, asymmetry, or associated skin lesions. No contractures.  Skin & Extremity Inspection: Skin color, temperature, and hair growth are WNL. No peripheral edema or cyanosis. No masses, redness, swelling, asymmetry, or associated skin lesions. No contractures.  Functional ROM: Unrestricted ROM          Functional ROM: Unrestricted ROM          Muscle Tone/Strength: Functionally intact. No obvious neuro-muscular anomalies detected.  Muscle Tone/Strength: Functionally intact. No obvious neuro-muscular anomalies detected.  Sensory (Neurological): Unimpaired          Sensory (Neurological): Unimpaired          Palpation: No palpable anomalies              Palpation: No palpable anomalies              Specialized Test(s): Deferred         Specialized Test(s): Deferred          Thoracic Spine Area Exam  Skin & Axial Inspection: No masses, redness, or swelling Alignment: Symmetrical Functional ROM: Unrestricted ROM Stability: No instability detected Muscle  Tone/Strength: Functionally intact. No obvious neuro-muscular anomalies detected. Sensory (Neurological): Unimpaired Muscle strength & Tone: No palpable anomalies  Lumbar Spine Area Exam  Skin & Axial Inspection: No masses, redness, or swelling Alignment: Symmetrical Functional ROM: Unrestricted ROM      Stability: No instability detected Muscle Tone/Strength: Functionally intact. No obvious neuro-muscular anomalies detected. Sensory (Neurological): Unimpaired Palpation: No palpable anomalies       Provocative Tests: Lumbar Hyperextension and rotation test: evaluation deferred today       Lumbar Lateral bending test: evaluation deferred today       Patrick's Maneuver: evaluation deferred today                    Gait & Posture Assessment  Ambulation: Patient ambulates using a wheel chair Gait: Very limited, using assistive device to ambulate Posture: Difficulty standing up straight, due to pain   Lower Extremity Exam    Side: Right lower extremity  Side: Left lower extremity  Skin & Extremity Inspection: Skin color, temperature, and hair growth are WNL. No peripheral edema or cyanosis. No masses, redness, swelling, asymmetry, or associated skin lesions. No contractures.  Skin & Extremity Inspection: Skin color, temperature, and hair growth are WNL. No peripheral edema or cyanosis. No masses, redness, swelling, asymmetry, or associated skin lesions. No contractures.  Functional ROM: Unrestricted ROM          Functional ROM: Unrestricted ROM          Muscle Tone/Strength: Functionally intact. No obvious neuro-muscular anomalies detected.  Muscle Tone/Strength: Functionally intact. No obvious neuro-muscular anomalies detected.  Sensory (Neurological): Unimpaired  Sensory (Neurological): Unimpaired  Palpation: No palpable anomalies  Palpation: No palpable anomalies   Assessment & Plan  Primary Diagnosis & Pertinent Problem List: The primary encounter diagnosis was Chronic pain of right  knee (Primary Area of Pain). Diagnoses of Osteoarthritis of knee (Right) and Tricompartment osteoarthritis of knee (Right) were also pertinent to this visit.  Visit Diagnosis: 1. Chronic pain of right knee (Primary Area of Pain)   2. Osteoarthritis of knee (Right)   3. Tricompartment osteoarthritis of knee (Right)    Problems updated and reviewed during this visit: Problem  Chronic knee pain (Primary Area of Pain) (Right)    Plan of Care  Pharmacotherapy (Medications Ordered): No orders of the defined types were placed in this encounter.   Procedure Orders     KNEE INJECTION Lab Orders  No laboratory test(s) ordered today   Imaging Orders  No imaging studies ordered today   Referral Orders  No referral(s) requested today    Pharmacological management options:  Opioid Analgesics: We'll take over management today. See above orders Membrane stabilizer: We have discussed the possibility of optimizing this mode of therapy, if tolerated Muscle relaxant: We have discussed the possibility of a trial NSAID: We have discussed the possibility of a trial Other analgesic(s): To be determined at a later time   Interventional management options: Planned, scheduled, and/or pending:    Diagnostic right intra-articular knee joint injection #1 with local anesthetic and steroid.  No fluoroscopy or IV sedation.   Considering:   Diagnostic right intra-articular knee injection Hyalgan series Diagnostic right genicular nerve block Possible right genicular nerve rate frequency ablation Diagnostic bilateral lumbar facet nerve block Possible bilateral lumbar facet RFA Diagnostic bilateral cervical facet nerve block Diagnostic bilateral facet RFA Diagnostic bilateral intra-articular shoulder injections    PRN Procedures:   None at this time   Provider-requested follow-up: Return for Procedure (no sedation): (R) IA Knee inj. (Steroids).  Future Appointments  Date Time Provider [place]  07/22/2017 10:15  AM Milinda Pointer, MD [facility] None    Primary Care Physician: [facility] [place] Location: [facility] Facility Note by: Gaspar Cola, MD Date: 07/14/2017; Time: 1:52 PM

## 2017-07-21 NOTE — Progress Notes (Signed)
Patient's Name: [name]  MRN: [id number]  Referring Provider: [facility] [place] [facility]*  DOB: 1955/05/09  PCP: [facility], [place]  DOS: 07/22/2017  Note by: Gaspar Cola, MD  Service setting: Ambulatory outpatient  Specialty: Interventional Pain Management  Patient type: Established  Location: [facility] (AMB) Pain Management Facility  Visit type: Interventional Procedure   Primary Reason for Visit: Interventional Pain Management Treatment. CC: Knee Pain (right)  Procedure:  Anesthesia, Analgesia, Anxiolysis:  Type: Diagnostic Intra-Articular Local anesthetic and steroid Knee Injection #1  Region: Lateral infrapatellar Knee Region Level: Knee Joint Laterality: Right knee  Type: Local Anesthesia Indication(s): Analgesia         Local Anesthetic: Lidocaine 1-2% Route: Infiltration (/IM) IV Access: Declined Sedation: Declined    Indications: 1. Osteoarthritis of knee (Right)   2. Chronic knee pain (Primary Area of Pain) (Right)   3. Tricompartment osteoarthritis of knee (Right)    Pain Score: Pre-procedure: 6 /10 Post-procedure: 0-No pain(numb)/10  Pre-op Assessment:  [name] is a 62 y.o. (year old), female patient, seen today for interventional treatment. She  has a past surgical history that includes Cesarean section ([id number]); Spine surgery; Tonsillectomy (1987); Toe amputation (Right, 2015); Hernia repair (2015); Cholecystectomy (2015); Cervical disc surgery; Colonoscopy w/ biopsies and polypectomy (06/10/2013); Esophagogastroduodenoscopy; Knee cartilage surgery (Right); partial amputation; Umbilical hernia repair; and Esophagogastroduodenoscopy (egd) with propofol (N/A, 06/27/2017). [name] has a current medication list which includes the following prescription(s): acetaminophen, bupropion, vitamin d3, citalopram, furosemide, lactulose, meloxicam, metformin, methocarbamol, pantoprazole, risperidone, spironolactone, and xifaxan. Her primarily concern  today is the Knee Pain (right)  Initial Vital Signs:  Pulse/HCG Rate: 74  Temp: 98.4 F (36.9 C) Resp: 16 BP: (!) 112/46 SpO2: 96 %  BMI: Estimated body mass index is 42.43 kg/m as calculated from the following:   Height as of this encounter: 5' 5"  (1.651 m).   Weight as of this encounter: 255 lb (115.7 kg).  Risk Assessment: Allergies: Reviewed. She is allergic to vicodin [hydrocodone-acetaminophen].  Allergy Precautions: None required Coagulopathies: Reviewed. None identified.  Blood-thinner therapy: None at this time Active Infection(s): Reviewed. None identified. [name] is afebrile  Site Confirmation: [name] was asked to confirm the procedure and laterality before marking the site Procedure checklist: Completed Consent: Before the procedure and under the influence of no sedative(s), amnesic(s), or anxiolytics, the patient was informed of the treatment options, risks and possible complications. To fulfill our ethical and legal obligations, as recommended by the American Medical Association's Code of Ethics, I have informed the patient of my clinical impression; the nature and purpose of the treatment or procedure; the risks, benefits, and possible complications of the intervention; the alternatives, including doing nothing; the risk(s) and benefit(s) of the alternative treatment(s) or procedure(s); and the risk(s) and benefit(s) of doing nothing. The patient was provided information about the general risks and possible complications associated with the procedure. These may include, but are not limited to: failure to achieve desired goals, infection, bleeding, organ or nerve damage, allergic reactions, paralysis, and death. In addition, the patient was informed of those risks and complications associated to the procedure, such as failure to decrease pain; infection; bleeding; organ or nerve damage with subsequent damage to sensory, motor, and/or autonomic systems, resulting in  permanent pain, numbness, and/or weakness of one or several areas of the body; allergic reactions; (i.e.: anaphylactic reaction); and/or death. Furthermore, the patient was informed of those risks and complications associated with the medications. These include, but are not limited  to: allergic reactions (i.e.: anaphylactic or anaphylactoid reaction(s)); adrenal axis suppression; blood sugar elevation that in diabetics may result in ketoacidosis or comma; water retention that in patients with history of congestive heart failure may result in shortness of breath, pulmonary edema, and decompensation with resultant heart failure; weight gain; swelling or edema; medication-induced neural toxicity; particulate matter embolism and blood vessel occlusion with resultant organ, and/or nervous system infarction; and/or aseptic necrosis of one or more joints. Finally, the patient was informed that Medicine is not an exact science; therefore, there is also the possibility of unforeseen or unpredictable risks and/or possible complications that may result in a catastrophic outcome. The patient indicated having understood very clearly. We have given the patient no guarantees and we have made no promises. Enough time was given to the patient to ask questions, all of which were answered to the patient's satisfaction. [name] has indicated that she wanted to continue with the procedure. Attestation: I, the ordering provider, attest that I have discussed with the patient the benefits, risks, side-effects, alternatives, likelihood of achieving goals, and potential problems during recovery for the procedure that I have provided informed consent. Date  Time: 07/22/2017 10:37 AM  Pre-Procedure Preparation:  Monitoring: As per clinic protocol. Respiration, ETCO2, SpO2, BP, heart rate and rhythm monitor placed and checked for adequate function Safety Precautions: Patient was assessed for positional comfort and pressure points  before starting the procedure. Time-out: I initiated and conducted the "Time-out" before starting the procedure, as per protocol. The patient was asked to participate by confirming the accuracy of the "Time Out" information. Verification of the correct person, site, and procedure were performed and confirmed by me, the nursing staff, and the patient. "Time-out" conducted as per Joint Commission's Universal Protocol (UP.01.01.01). Time: 1105  Description of Procedure:       Position: Sitting Target Area: Knee Joint Approach: Just above the Lateral tibial plateau, lateral to the infrapatellar tendon. Area Prepped: Entire knee area, from the mid-thigh to the mid-shin. Prepping solution: ChloraPrep (2% chlorhexidine gluconate and 70% isopropyl alcohol) Safety Precautions: Aspiration looking for blood return was conducted prior to all injections. At no point did we inject any substances, as a needle was being advanced. No attempts were made at seeking any paresthesias. Safe injection practices and needle disposal techniques used. Medications properly checked for expiration dates. SDV (single dose vial) medications used. Description of the Procedure: Protocol guidelines were followed. The patient was placed in position over the fluoroscopy table. The target area was identified and the area prepped in the usual manner. Skin desensitized using vapocoolant spray. Skin & deeper tissues infiltrated with local anesthetic. Appropriate amount of time allowed to pass for local anesthetics to take effect. The procedure needles were then advanced to the target area. Proper needle placement secured. Negative aspiration confirmed. Solution injected in intermittent fashion, asking for systemic symptoms every 0.5cc of injectate. The needles were then removed and the area cleansed, making sure to leave some of the prepping solution back to take advantage of its long term bactericidal properties. Vitals:   07/22/17 1035  07/22/17 1110  BP: (!) 112/46 (!) 110/55  Pulse: 74 76  Resp: 16 18  Temp: 98.4 F (36.9 C)   TempSrc: Oral   SpO2: 96% 97%  Weight: 255 lb (115.7 kg)   Height: 5' 5"  (1.651 m)     Start Time: 1105 hrs. End Time: 1107 hrs. Materials:  Needle(s) Type: Regular needle Gauge: 25G Length: 1.5-in Medication(s): Please see orders for  medications and dosing details.  Imaging Guidance:  Type of Imaging Technique: None used Indication(s): N/A Exposure Time: No patient exposure Contrast: None used. Fluoroscopic Guidance: N/A Ultrasound Guidance: N/A Interpretation: N/A  Antibiotic Prophylaxis:   Anti-infectives (From admission, onward)   None     Indication(s): None identified  Post-operative Assessment:  Post-procedure Vital Signs:  Pulse/HCG Rate: 76  Temp: 98.4 F (36.9 C) Resp: 18 BP: (!) 110/55 SpO2: 97 %  EBL: None  Complications: No immediate post-treatment complications observed by team, or reported by patient.  Note: The patient tolerated the entire procedure well. A repeat set of vitals were taken after the procedure and the patient was kept under observation following institutional policy, for this type of procedure. Post-procedural neurological assessment was performed, showing return to baseline, prior to discharge. The patient was provided with post-procedure discharge instructions, including a section on how to identify potential problems. Should any problems arise concerning this procedure, the patient was given instructions to immediately contact us, at any time, without hesitation. In any case, we plan to contact the patient by telephone for a follow-up status report regarding this interventional procedure.  Comments:  No additional relevant information.  Plan of Care   Imaging Orders  No imaging studies ordered today    Procedure Orders     KNEE INJECTION  Medications ordered for procedure: Meds ordered this encounter  Medications  . lidocaine  (PF) (XYLOCAINE) 1 % injection 5 mL  . ropivacaine (PF) 2 mg/mL (0.2%) (NAROPIN) injection 2 mL  . methylPREDNISolone acetate (DEPO-MEDROL) injection 80 mg   Medications administered: We administered lidocaine (PF), ropivacaine (PF) 2 mg/mL (0.2%), and methylPREDNISolone acetate.  See the medical record for exact dosing, route, and time of administration.  New Prescriptions   No medications on file   Disposition: Discharge home  Discharge Date & Time: 07/22/2017; 1115 hrs.   Physician-requested Follow-up: Return for post-procedure eval (2 wks), w/ Dr. Dossie Arbour.  No future appointments. Primary Care Physician: [facility] [place] Location: [facility] Outpatient Pain Management Facility Note by: Gaspar Cola, MD Date: 07/22/2017; Time: 11:16 AM  Disclaimer:  Medicine is not an exact science. The only guarantee in medicine is that nothing is guaranteed. It is important to note that the decision to proceed with this intervention was based on the information collected from the patient. The Data and conclusions were drawn from the patient's questionnaire, the interview, and the physical examination. Because the information was provided in large part by the patient, it cannot be guaranteed that it has not been purposely or unconsciously manipulated. Every effort has been made to obtain as much relevant data as possible for this evaluation. It is important to note that the conclusions that lead to this procedure are derived in large part from the available data. Always take into account that the treatment will also be dependent on availability of resources and existing treatment guidelines, considered by other Pain Management Practitioners as being common knowledge and practice, at the time of the intervention. For Medico-Legal purposes, it is also important to point out that variation in procedural techniques and pharmacological choices are the acceptable norm. The indications,  contraindications, technique, and results of the above procedure should only be interpreted and judged by a Board-Certified Interventional Pain Specialist with extensive familiarity and expertise in the same exact procedure and technique.

## 2017-07-22 ENCOUNTER — Other Ambulatory Visit: Payer: Self-pay

## 2017-07-22 ENCOUNTER — Encounter: Payer: Self-pay | Admitting: Pain Medicine

## 2017-07-22 ENCOUNTER — Ambulatory Visit: Payer: Medicare Other | Attending: Pain Medicine | Admitting: Pain Medicine

## 2017-07-22 VITALS — BP 110/55 | HR 76 | Temp 98.4°F | Resp 18 | Ht 65.0 in | Wt 255.0 lb

## 2017-07-22 DIAGNOSIS — M25561 Pain in right knee: Secondary | ICD-10-CM

## 2017-07-22 DIAGNOSIS — G8929 Other chronic pain: Secondary | ICD-10-CM

## 2017-07-22 DIAGNOSIS — M1711 Unilateral primary osteoarthritis, right knee: Secondary | ICD-10-CM | POA: Diagnosis not present

## 2017-07-22 MED ORDER — LIDOCAINE HCL (PF) 1 % IJ SOLN
INTRAMUSCULAR | Status: AC
  Filled 2017-07-22: qty 5

## 2017-07-22 MED ORDER — METHYLPREDNISOLONE ACETATE 80 MG/ML IJ SUSP
80.0000 mg | Freq: Once | INTRAMUSCULAR | Status: AC
  Administered 2017-07-22: 80 mg via INTRA_ARTICULAR

## 2017-07-22 MED ORDER — LIDOCAINE HCL (PF) 1 % IJ SOLN
5.0000 mL | Freq: Once | INTRAMUSCULAR | Status: AC
  Administered 2017-07-22: 5 mL

## 2017-07-22 MED ORDER — ROPIVACAINE HCL 2 MG/ML IJ SOLN
INTRAMUSCULAR | Status: AC
  Filled 2017-07-22: qty 10

## 2017-07-22 MED ORDER — ROPIVACAINE HCL 2 MG/ML IJ SOLN
2.0000 mL | Freq: Once | INTRAMUSCULAR | Status: AC
  Administered 2017-07-22: 2 mL via INTRA_ARTICULAR

## 2017-07-22 MED ORDER — METHYLPREDNISOLONE ACETATE 80 MG/ML IJ SUSP
INTRAMUSCULAR | Status: AC
  Filled 2017-07-22: qty 1

## 2017-07-22 NOTE — Patient Instructions (Addendum)
____________________________________________________________________________________________  Post-Procedure Discharge Instructions  Instructions:  Apply ice: Fill a plastic sandwich bag with crushed ice. Cover it with a small towel and apply to injection site. Apply for 15 minutes then remove x 15 minutes. Repeat sequence on day of procedure, until you go to bed. The purpose is to minimize swelling and discomfort after procedure.  Apply heat: Apply heat to procedure site starting the day following the procedure. The purpose is to treat any soreness and discomfort from the procedure.  Food intake: Start with clear liquids (like water) and advance to regular food, as tolerated.   Physical activities: Keep activities to a minimum for the first 8 hours after the procedure.   Driving: If you have received any sedation, you are not allowed to drive for 24 hours after your procedure.  Blood thinner: Restart your blood thinner 6 hours after your procedure. (Only for those taking blood thinners)  Insulin: As soon as you can eat, you may resume your normal dosing schedule. (Only for those taking insulin)  Infection prevention: Keep procedure site clean and dry.  Post-procedure Pain Diary: Extremely important that this be done correctly and accurately. Recorded information will be used to determine the next step in treatment.  Pain evaluated is that of treated area only. Do not include pain from an untreated area.  Complete every hour, on the hour, for the initial 8 hours. Set an alarm to help you do this part accurately.  Do not go to sleep and have it completed later. It will not be accurate.  Follow-up appointment: Keep your follow-up appointment after the procedure. Usually 2 weeks for most procedures. (6 weeks in the case of radiofrequency.) Bring you pain diary.   Expect:  From numbing medicine (AKA: Local Anesthetics): Numbness or decrease in pain.  Onset: Full effect within 15  minutes of injected.  Duration: It will depend on the type of local anesthetic used. On the average, 1 to 8 hours.   From steroids: Decrease in swelling or inflammation. Once inflammation is improved, relief of the pain will follow.  Onset of benefits: Depends on the amount of swelling present. The more swelling, the longer it will take for the benefits to be seen. In some cases, up to 10 days.  Duration: Steroids will stay in the system x 2 weeks. Duration of benefits will depend on multiple posibilities including persistent irritating factors.  From procedure: Some discomfort is to be expected once the numbing medicine wears off. This should be minimal if ice and heat are applied as instructed.  Call if:  You experience numbness and weakness that gets worse with time, as opposed to wearing off.  New onset bowel or bladder incontinence. (This applies to Spinal procedures only)  Emergency Numbers:  Durning business hours (Monday - Thursday, 8:00 AM - 4:00 PM) (Friday, 9:00 AM - 12:00 Noon): [phone]  After hours: [phone] ____________________________________________________________________________________________    Knee Injection A knee injection is a procedure to get medicine into your knee joint. Your health care provider puts a needle into the joint and injects medicine with an attached syringe. The injected medicine may relieve the pain, swelling, and stiffness of arthritis. The injected medicine may also help to lubricate and cushion your knee joint. You may need more than one injection. Tell a health care provider about:  Any allergies you have.  All medicines you are taking, including vitamins, herbs, eye drops, creams, and over-the-counter medicines.  Any problems you or family members have  had with anesthetic medicines.  Any blood disorders you have.  Any surgeries you have had.  Any medical conditions you have. What are the risks? Generally, this is a  safe procedure. However, problems may occur, including:  Infection.  Bleeding.  Worsening symptoms.  Damage to the area around your knee.  Allergic reaction to any of the medicines.  Skin reactions from repeated injections.  What happens before the procedure?  Ask your health care provider about changing or stopping your regular medicines. This is especially important if you are taking diabetes medicines or blood thinners.  Plan to have someone take you home after the procedure. What happens during the procedure?  You will sit or lie down in a position for your knee to be treated.  The skin over your kneecap will be cleaned with a germ-killing solution (antiseptic).  You will be given a medicine that numbs the area (local anesthetic). You may feel some stinging.  After your knee becomes numb, you will have a second injection. This is the medicine. This needle is carefully placed between your kneecap and your knee. The medicine is injected into the joint space.  At the end of the procedure, the needle will be removed.  A bandage (dressing) may be placed over the injection site. The procedure may vary among health care providers and hospitals. What happens after the procedure?  You may have to move your knee through its full range of motion. This helps to get all of the medicine into your joint space.  Your blood pressure, heart rate, breathing rate, and blood oxygen level will be monitored often until the medicines you were given have worn off.  You will be watched to make sure that you do not have a reaction to the injected medicine. This information is not intended to replace advice given to you by your health care provider. Make sure you discuss any questions you have with your health care provider. Document Released: 06/09/2006 Document Revised: 08/18/2015 Document Reviewed: 01/26/2014 Elsevier Interactive Patient Education  2018 [employer].

## 2017-07-22 NOTE — Progress Notes (Signed)
Safety precautions to be maintained throughout the outpatient stay will include: orient to surroundings, keep bed in low position, maintain call bell within reach at all times, provide assistance with transfer out of bed and ambulation.  

## 2017-07-23 ENCOUNTER — Telehealth: Payer: Self-pay

## 2017-07-23 NOTE — Telephone Encounter (Signed)
Post procedure phone call.  Left message.

## 2017-08-11 ENCOUNTER — Ambulatory Visit: Payer: Medicare Other | Admitting: Pain Medicine

## 2017-08-11 NOTE — Progress Notes (Deleted)
Patient's Name: [name]  MRN: [id number]  Referring Provider: [facility]*  DOB: 1956-03-28  PCP: [facility], [place]: 08/11/2017  Note by: Gaspar Cola, MD  Service setting: Ambulatory outpatient  Specialty: Interventional Pain Management  Location: [facility] (AMB) Pain Management Facility    Patient type: Established   Primary Reason(s) for Visit: Encounter for post-procedure evaluation of chronic illness with mild to moderate exacerbation CC: No chief complaint on file.  HPI  [name] is a 62 y.o. year old, female patient, who comes today for a post-procedure evaluation. She has Hepatic cirrhosis ([place]); Polyp of colon; DJD (degenerative joint disease) of knee; Diabetes mellitus ([place]); Fibromyalgia; Cirrhosis of liver ([place]); Chronic knee pain (Primary Area of Pain) (Right); Chronic low back pain (Secondary Area of Pain) (Bilateral) (R>L); Chronic neck pain (Tertiary Area of Pain); Chronic shoulder pain (Fourth Area of Pain) (Bilateral) (R>L); Chronic pain syndrome; Pharmacologic therapy; Disorder of skeletal system; Problems influencing health status; Osteoarthritis of knee (Right); and Tricompartment osteoarthritis of knee (Right) on their problem list. Her primarily concern today is the No chief complaint on file.  Pain Assessment: Location:     Radiating:   Onset:   Duration:   Quality:   Severity:  /10 (subjective, self-reported pain score)  Note: Reported level is compatible with observation.                         When using our objective Pain Scale, levels between 6 and 10/10 are said to belong in an emergency room, as it progressively worsens from a 6/10, described as severely limiting, requiring emergency care not usually available at an outpatient pain management facility. At a 6/10 level, communication becomes difficult and requires great effort. Assistance to reach the emergency department may be required. Facial flushing and profuse  sweating along with potentially dangerous increases in heart rate and blood pressure will be evident. Effect on ADL:   Timing:   Modifying factors:   BP:    HR:    [name] comes in today for post-procedure evaluation after the treatment done on 07/22/2017.  Further details on both, my assessment(s), as well as the proposed treatment plan, please see below.  Post-Procedure Assessment  07/22/2017 Procedure: Diagnostic right-sided intra-articular knee joint injection #1 with local anesthetic and steroid, no fluoroscopy or sedation. Pre-procedure pain score:  6/10 Post-procedure pain score: 0/10 (100% relief) Influential Factors: BMI:   Intra-procedural challenges: None observed.         Assessment challenges: None detected.              Reported side-effects: None.        Post-procedural adverse reactions or complications: None reported         Sedation: No sedation used. When no sedatives are used, the analgesic levels obtained are directly associated to the effectiveness of the local anesthetics. However, when sedation is provided, the level of analgesia obtained during the initial 1 hour following the intervention, is believed to be the result of a combination of factors. These factors may include, but are not limited to: 1. The effectiveness of the local anesthetics used. 2. The effects of the analgesic(s) and/or anxiolytic(s) used. 3. The degree of discomfort experienced by the patient at the time of the procedure. 4. The patients ability and reliability in recalling and recording the events. 5. The presence and influence of possible secondary gains and/or psychosocial factors. Reported result: Relief experienced during  the 1st hour after the procedure:   (Ultra-Short Term Relief)            Interpretative annotation: Clinically appropriate result. No IV Analgesic or Anxiolytic given, therefore benefits are completely due to Local Anesthetic effects.          Effects of local  anesthetic: The analgesic effects attained during this period are directly associated to the localized infiltration of local anesthetics and therefore cary significant diagnostic value as to the etiological location, or anatomical origin, of the pain. Expected duration of relief is directly dependent on the pharmacodynamics of the local anesthetic used. Long-acting (4-6 hours) anesthetics used.  Reported result: Relief during the next 4 to 6 hour after the procedure:   (Short-Term Relief)            Interpretative annotation: Clinically appropriate result. Analgesia during this period is likely to be Local Anesthetic-related.          Long-term benefit: Defined as the period of time past the expected duration of local anesthetics (1 hour for short-acting and 4-6 hours for long-acting). With the possible exception of prolonged sympathetic blockade from the local anesthetics, benefits during this period are typically attributed to, or associated with, other factors such as analgesic sensory neuropraxia, antiinflammatory effects, or beneficial biochemical changes provided by agents other than the local anesthetics.  Reported result: Extended relief following procedure:   (Long-Term Relief)            Interpretative annotation: Clinically appropriate result. Good relief. No permanent benefit expected. Inflammation plays a part in the etiology to the pain.          Current benefits: Defined as reported results that persistent at this point in time.   Analgesia: *** %            Function: Somewhat improved ROM: Somewhat improved Interpretative annotation: Recurrence of symptoms. No permanent benefit expected. Effective diagnostic intervention.          Interpretation: Results would suggest a successful diagnostic intervention.                  Plan:  Please see "Plan of Care" for details.                Laboratory Chemistry  Inflammation Markers (CRP: Acute Phase) (ESR: Chronic Phase) Lab Results   Component Value Date   CRP 4.7 06/09/2017   ESRSEDRATE 16 06/09/2017                         Renal Function Markers Lab Results  Component Value Date   BUN 5 (L) 06/09/2017   CREATININE 0.62 06/09/2017   GFRAA 113 06/09/2017   GFRNONAA 98 06/09/2017                              Hepatic Function Markers Lab Results  Component Value Date   AST 25 06/09/2017   ALT 18 05/26/2013   ALBUMIN 4.1 06/09/2017   ALKPHOS 128 (H) 06/09/2017                        Electrolytes Lab Results  Component Value Date   NA 137 06/09/2017   K 4.0 06/09/2017   CL 95 (L) 06/09/2017   CALCIUM 9.6 06/09/2017   MG 2.0 06/09/2017  Neuropathy Markers Lab Results  Component Value Date   YIRSWNIO27 035 06/09/2017                        Bone Pathology Markers Lab Results  Component Value Date   25OHVITD1 41 06/09/2017   25OHVITD2 1.3 06/09/2017   25OHVITD3 40 06/09/2017                         Coagulation Parameters Lab Results  Component Value Date   INR 1.2 09/21/2013   LABPROT 14.7 09/21/2013   APTT 33.2 09/09/2013   PLT 253 05/26/2013                        Cardiovascular Markers Lab Results  Component Value Date   TROPONINI < 0.02 05/26/2013   HGB 11.7 (L) 05/26/2013   HCT 35.0 05/26/2013                         Note: Lab results reviewed.  Recent Diagnostic Imaging Results  DG Shoulder Left CLINICAL DATA:  Chronic left shoulder pain.  No known injury.  EXAM: LEFT SHOULDER - 2+ VIEW  COMPARISON:  None.  FINDINGS: No acute bony or joint abnormality is seen. Mild to moderate acromioclavicular osteoarthritis is noted. The glenohumeral joint appears normal. Imaged lung parenchyma and ribs are unremarkable.  IMPRESSION: Mild to moderate acromioclavicular osteoarthritis. Otherwise negative.  Electronically Signed   By: Inge Rise M.D.   On: 06/10/2017 08:25 DG Shoulder Right CLINICAL DATA:  Chronic right shoulder pain.  No known  injury.  EXAM: RIGHT SHOULDER - 2+ VIEW  COMPARISON:  None.  FINDINGS: No acute bony or joint abnormality is seen. The glenohumeral joint is unremarkable. Mild acromioclavicular osteoarthritis is noted. There appears to be a right pleural effusion and basilar airspace disease.  IMPRESSION: Mild acromioclavicular osteoarthritis. The shoulder is otherwise unremarkable.  Likely right pleural effusion and basilar airspace disease. Finding could be further evaluated with PA and lateral views of the chest.  Electronically Signed   By: Inge Rise M.D.   On: 06/10/2017 08:24 DG Knee 1-2 Views Right CLINICAL DATA:  Chronic right knee pain.  No known injury.  EXAM: RIGHT KNEE - 1-2 VIEW  COMPARISON:  None.  FINDINGS: No acute bony or joint abnormality is identified. The patient has severe osteoarthritis about the knee which is worst in the medial compartment where there is bone-on-bone joint space narrowing. No joint effusion.  IMPRESSION: No acute finding.  Severe tricompartmental osteoarthritis is worst medially.  Electronically Signed   By: Inge Rise M.D.   On: 06/10/2017 08:23 DG Lumbar Spine Complete W/Bend CLINICAL DATA:  Chronic low back pain.  No known injury.  EXAM: LUMBAR SPINE - COMPLETE WITH BENDING VIEWS  COMPARISON:  None.  FINDINGS: No fracture is identified. There is straightening of the normal lumbar lordosis. Severe convex right scoliosis is seen with the apex at L4. Severe loss of disc space height is present at L2-3, L3-4 and L4-5. Scattered facet arthropathy appears worst at L5-S1. Paraspinous structures are unremarkable.  IMPRESSION: No acute abnormality.  Marked convex right scoliosis and severe multilevel degenerative disc disease.  Electronically Signed   By: Inge Rise M.D.   On: 06/10/2017 08:22 DG Cervical Spine With Flex & Extend CLINICAL DATA:  Chronic neck and bilateral shoulder pain. History of prior  cervical fusion.  EXAM: CERVICAL  SPINE COMPLETE WITH FLEXION AND EXTENSION VIEWS  COMPARISON:  None.  FINDINGS: The patient is status post C5-6 ACDF. The level is solidly fused and hardware is intact. No pathologic motion is seen with flexion or extension. Scattered loss of disc space height and endplate spurring are worst at C4-5 and C6-7. No fracture. Prevertebral soft tissues appear normal.  IMPRESSION: Advanced degenerative disc disease C4-5. Somewhat milder degree of degenerative disc disease C6-7 is also seen.  Status post solid C5-6 ACDF.  Electronically Signed   By: Inge Rise M.D.   On: 06/10/2017 08:21  Complexity Note: I personally reviewed the fluoroscopic imaging of the procedure.                        Meds   Current Outpatient Medications:  .  acetaminophen (TYLENOL) 500 MG tablet, Take 500 mg by mouth every 6 (six) hours as needed., Disp: , Rfl:  .  buPROPion (WELLBUTRIN XL) 300 MG 24 hr tablet, Take 300 mg by mouth daily., Disp: , Rfl:  .  Cholecalciferol (VITAMIN D3) 1000 UNITS CAPS, Take by mouth., Disp: , Rfl:  .  citalopram (CELEXA) 40 MG tablet, Take 40 mg by mouth daily., Disp: , Rfl:  .  furosemide (LASIX) 20 MG tablet, Take 20 mg by mouth 2 (two) times daily., Disp: , Rfl:  .  lactulose (CHRONULAC) 10 GM/15ML solution, , Disp: , Rfl:  .  meloxicam (MOBIC) 15 MG tablet, Take 15 mg by mouth daily., Disp: , Rfl:  .  metFORMIN (GLUCOPHAGE) 500 MG tablet, , Disp: , Rfl: 1 .  methocarbamol (ROBAXIN) 750 MG tablet, Take 750 mg by mouth 3 (three) times daily., Disp: , Rfl:  .  pantoprazole (PROTONIX) 40 MG tablet, Take 40 mg by mouth daily., Disp: , Rfl:  .  risperiDONE (RISPERDAL) 2 MG tablet, Take 2 mg by mouth 2 (two) times daily., Disp: , Rfl:  .  spironolactone (ALDACTONE) 25 MG tablet, Take 25 mg by mouth daily. , Disp: , Rfl:  .  XIFAXAN 550 MG TABS tablet, 550 mg 2 (two) times daily. , Disp: , Rfl:   ROS  Constitutional: Denies any fever or  chills Gastrointestinal: No reported hemesis, hematochezia, vomiting, or acute GI distress Musculoskeletal: Denies any acute onset joint swelling, redness, loss of ROM, or weakness Neurological: No reported episodes of acute onset apraxia, aphasia, dysarthria, agnosia, amnesia, paralysis, loss of coordination, or loss of consciousness  Allergies  Ms. Pederson is allergic to vicodin [hydrocodone-acetaminophen].  [place]  Drug: Ms. Parrilla  reports that she does not use drugs. Alcohol:  reports that she does not drink alcohol. Tobacco:  reports that she has never smoked. She has never used smokeless tobacco. Medical:  has a past medical history of Abdominal hernia, Allergy, Anemia, Anginal pain ([place]), CHF (congestive heart failure) ([place]), Colonic polyp, DDD (degenerative disc disease), cervical (ddd), Depression, Diabetes mellitus without complication ([place]), Fibromyalgia, Gout, Hyperlipidemia, Hypertension, IBS (irritable bowel syndrome), Liver cirrhosis ([place]), and Polyneuropathy. Surgical: Ms. Windt  has a past surgical history that includes Cesarean section [phone]); Spine surgery; Tonsillectomy (1987); Toe amputation (Right, 2015); Hernia repair (2015); Cholecystectomy (2015); Cervical disc surgery; Colonoscopy w/ biopsies and polypectomy (06/10/2013); Esophagogastroduodenoscopy; Knee cartilage surgery (Right); partial amputation; Umbilical hernia repair; and Esophagogastroduodenoscopy (egd) with propofol (N/A, 06/27/2017). Family: family history includes Alcohol abuse in her father and mother; Arthritis in her mother; Colon polyps in her sister; Depression in her father and mother; Diabetes in her mother; Heart  disease in her maternal aunt; Hypertension in her mother; Mental illness in her father and mother.  Constitutional Exam  General appearance: Well nourished, well developed, and well hydrated. In no apparent acute distress There were no vitals filed for this visit. BMI Assessment:  Estimated body mass index is 42.43 kg/m as calculated from the following:   Height as of 07/22/17: 5' 5"  (1.651 m).   Weight as of 07/22/17: 255 lb (115.7 kg).  BMI interpretation table: BMI level Category Range association with higher incidence of chronic pain  <18 kg/m2 Underweight   18.5-24.9 kg/m2 Ideal body weight   25-29.9 kg/m2 Overweight Increased incidence by 20%  30-34.9 kg/m2 Obese (Class I) Increased incidence by 68%  35-39.9 kg/m2 Severe obesity (Class II) Increased incidence by 136%  >40 kg/m2 Extreme obesity (Class III) Increased incidence by 254%   Patient's current BMI Ideal Body weight  There is no height or weight on file to calculate BMI. Patient weight not recorded   BMI Readings from Last 4 Encounters:  07/22/17 42.43 kg/m  07/14/17 43.27 kg/m  06/27/17 42.43 kg/m  06/09/17 42.77 kg/m   Wt Readings from Last 4 Encounters:  07/22/17 255 lb (115.7 kg)  07/14/17 260 lb (117.9 kg)  06/27/17 255 lb (115.7 kg)  09/21/15 257 lb (116.6 kg)  Psych/Mental status: Alert, oriented x 3 (person, place, & time)       Eyes: PERLA Respiratory: No evidence of acute respiratory distress  Cervical Spine Area Exam  Skin & Axial Inspection: No masses, redness, edema, swelling, or associated skin lesions Alignment: Symmetrical Functional ROM: Unrestricted ROM      Stability: No instability detected Muscle Tone/Strength: Functionally intact. No obvious neuro-muscular anomalies detected. Sensory (Neurological): Unimpaired Palpation: No palpable anomalies              Upper Extremity (UE) Exam    Side: Right upper extremity  Side: Left upper extremity  Skin & Extremity Inspection: Skin color, temperature, and hair growth are WNL. No peripheral edema or cyanosis. No masses, redness, swelling, asymmetry, or associated skin lesions. No contractures.  Skin & Extremity Inspection: Skin color, temperature, and hair growth are WNL. No peripheral edema or cyanosis. No masses,  redness, swelling, asymmetry, or associated skin lesions. No contractures.  Functional ROM: Unrestricted ROM          Functional ROM: Unrestricted ROM          Muscle Tone/Strength: Functionally intact. No obvious neuro-muscular anomalies detected.  Muscle Tone/Strength: Functionally intact. No obvious neuro-muscular anomalies detected.  Sensory (Neurological): Unimpaired          Sensory (Neurological): Unimpaired          Palpation: No palpable anomalies              Palpation: No palpable anomalies              Specialized Test(s): Deferred         Specialized Test(s): Deferred          Thoracic Spine Area Exam  Skin & Axial Inspection: No masses, redness, or swelling Alignment: Symmetrical Functional ROM: Unrestricted ROM Stability: No instability detected Muscle Tone/Strength: Functionally intact. No obvious neuro-muscular anomalies detected. Sensory (Neurological): Unimpaired Muscle strength & Tone: No palpable anomalies  Lumbar Spine Area Exam  Skin & Axial Inspection: No masses, redness, or swelling Alignment: Symmetrical Functional ROM: Unrestricted ROM       Stability: No instability detected Muscle Tone/Strength: Functionally intact. No obvious neuro-muscular anomalies detected. Sensory (  Neurological): Unimpaired Palpation: No palpable anomalies       Provocative Tests: Lumbar Hyperextension and rotation test: evaluation deferred today       Lumbar Lateral bending test: evaluation deferred today       Patrick's Maneuver: evaluation deferred today                    Gait & Posture Assessment  Ambulation: Unassisted Gait: Relatively normal for age and body habitus Posture: WNL   Lower Extremity Exam    Side: Right lower extremity  Side: Left lower extremity  Stability: No instability observed          Stability: No instability observed          Skin & Extremity Inspection: Skin color, temperature, and hair growth are WNL. No peripheral edema or cyanosis. No masses,  redness, swelling, asymmetry, or associated skin lesions. No contractures.  Skin & Extremity Inspection: Skin color, temperature, and hair growth are WNL. No peripheral edema or cyanosis. No masses, redness, swelling, asymmetry, or associated skin lesions. No contractures.  Functional ROM: Unrestricted ROM                  Functional ROM: Unrestricted ROM                  Muscle Tone/Strength: Functionally intact. No obvious neuro-muscular anomalies detected.  Muscle Tone/Strength: Functionally intact. No obvious neuro-muscular anomalies detected.  Sensory (Neurological): Unimpaired  Sensory (Neurological): Unimpaired  Palpation: No palpable anomalies  Palpation: No palpable anomalies   Assessment  Primary Diagnosis & Pertinent Problem List: The primary encounter diagnosis was Chronic knee pain (Primary Area of Pain) (Right). Diagnoses of Osteoarthritis of knee (Right), Chronic low back pain (Secondary Area of Pain) (Bilateral) (R>L), Chronic neck pain (Tertiary Area of Pain), and Chronic shoulder pain (Fourth Area of Pain) (Bilateral) (R>L) were also pertinent to this visit.  Status Diagnosis  Controlled Controlled Controlled 1. Chronic knee pain (Primary Area of Pain) (Right)   2. Osteoarthritis of knee (Right)   3. Chronic low back pain (Secondary Area of Pain) (Bilateral) (R>L)   4. Chronic neck pain (Tertiary Area of Pain)   5. Chronic shoulder pain (Fourth Area of Pain) (Bilateral) (R>L)     Problems updated and reviewed during this visit: Problem  Chronic low back pain (Secondary Area of Pain) (Bilateral) (R>L)  Chronic shoulder pain (Fourth Area of Pain) (Bilateral) (R>L)   Plan of Care  Pharmacotherapy (Medications Ordered): No orders of the defined types were placed in this encounter.  Medications administered today: Gustavus Messing had no medications administered during this visit.  Procedure Orders    No procedure(s) ordered today   Lab Orders  No laboratory test(s)  ordered today   Imaging Orders  No imaging studies ordered today   Referral Orders  No referral(s) requested today    Interventional management options: Planned, scheduled, and/or pending:   ***   Considering:   Possible series of 5 right sided intra-articular Hyalgan knee joint injection  Diagnosticright genicular nerve block Possibleright genicular nerve rate frequency ablation Diagnostic bilateral lumbar facet nerve block Possiblebilateral lumbar facet RFA Diagnostic bilateral cervical facet nerve block Diagnostic bilateral facet RFA Diagnosticbilateral intra-articular shoulder injections    Palliative PRN treatment(s):   Diagnostic/palliativeright intra-articular knee injection with local anesthetic and steroid #2    Provider-requested follow-up: No follow-ups on file.  Future Appointments  Date Time Provider [place]  08/11/2017  9:45 AM Milinda Pointer, MD [facility]  None   Primary Care Physician: [facility], [place] Location: [facility] Note by: Gaspar Cola, MD Date: 08/11/2017; Time: 7:06 AM

## 2017-08-31 NOTE — Progress Notes (Deleted)
Patient's Name: [name]  MRN: [id number]  Referring Provider: [facility]*  DOB: Nov 30, 1955  PCP: [facility], [place]: 09/01/2017  Note by: Gaspar Cola, MD  Service setting: Ambulatory outpatient  Specialty: Interventional Pain Management  Location: [facility] (AMB) Pain Management Facility    Patient type: Established   Primary Reason(s) for Visit: Encounter for post-procedure evaluation of chronic illness with mild to moderate exacerbation CC: No chief complaint on file.  HPI  [name] is a 62 y.o. year old, female patient, who comes today for a post-procedure evaluation. She has Hepatic cirrhosis ([place]); Polyp of colon; DJD (degenerative joint disease) of knee; Diabetes mellitus ([place]); Fibromyalgia; Cirrhosis of liver ([place]); Chronic knee pain (Primary Area of Pain) (Right); Chronic low back pain (Secondary Area of Pain) (Bilateral) (R>L); Chronic neck pain (Tertiary Area of Pain); Chronic shoulder pain (Fourth Area of Pain) (Bilateral) (R>L); Chronic pain syndrome; Pharmacologic therapy; Disorder of skeletal system; Problems influencing health status; Osteoarthritis of knee (Right); and Tricompartment osteoarthritis of knee (Right) on their problem list. Her primarily concern today is the No chief complaint on file.  Pain Assessment: Location:     Radiating:   Onset:   Duration:   Quality:   Severity:  /10 (subjective, self-reported pain score)  Note: Reported level is compatible with observation.                         When using our objective Pain Scale, levels between 6 and 10/10 are said to belong in an emergency room, as it progressively worsens from a 6/10, described as severely limiting, requiring emergency care not usually available at an outpatient pain management facility. At a 6/10 level, communication becomes difficult and requires great effort. Assistance to reach the emergency department may be required. Facial flushing and profuse sweating  along with potentially dangerous increases in heart rate and blood pressure will be evident. Effect on ADL:   Timing:   Modifying factors:   BP:    HR:    [name] comes in today for post-procedure evaluation after the treatment done on 07/23/2017.  Further details on both, my assessment(s), as well as the proposed treatment plan, please see below.  Post-Procedure Assessment  07/22/2017 Procedure: Diagnostic right intra-articular knee joint injection #1 with local anesthetic and steroid.  No fluoroscopy or IV sedation. Pre-procedure pain score:        /10 Post-procedure pain score: 0/10         Influential Factors: BMI:   Intra-procedural challenges: None observed.         Assessment challenges: None detected.              Reported side-effects: None.        Post-procedural adverse reactions or complications: None reported         Sedation: Please see nurses note. When no sedatives are used, the analgesic levels obtained are directly associated to the effectiveness of the local anesthetics. However, when sedation is provided, the level of analgesia obtained during the initial 1 hour following the intervention, is believed to be the result of a combination of factors. These factors may include, but are not limited to: 1. The effectiveness of the local anesthetics used. 2. The effects of the analgesic(s) and/or anxiolytic(s) used. 3. The degree of discomfort experienced by the patient at the time of the procedure. 4. The patients ability and reliability in recalling and recording the events. 5. The  presence and influence of possible secondary gains and/or psychosocial factors. Reported result: Relief experienced during the 1st hour after the procedure:   (Ultra-Short Term Relief)            Interpretative annotation: Clinically appropriate result. Analgesia during this period is likely to be Local Anesthetic and/or IV Sedative (Analgesic/Anxiolytic) related.          Effects of local  anesthetic: The analgesic effects attained during this period are directly associated to the localized infiltration of local anesthetics and therefore cary significant diagnostic value as to the etiological location, or anatomical origin, of the pain. Expected duration of relief is directly dependent on the pharmacodynamics of the local anesthetic used. Long-acting (4-6 hours) anesthetics used.  Reported result: Relief during the next 4 to 6 hour after the procedure:   (Short-Term Relief)            Interpretative annotation: Clinically appropriate result. Analgesia during this period is likely to be Local Anesthetic-related.          Long-term benefit: Defined as the period of time past the expected duration of local anesthetics (1 hour for short-acting and 4-6 hours for long-acting). With the possible exception of prolonged sympathetic blockade from the local anesthetics, benefits during this period are typically attributed to, or associated with, other factors such as analgesic sensory neuropraxia, antiinflammatory effects, or beneficial biochemical changes provided by agents other than the local anesthetics.  Reported result: Extended relief following procedure:   (Long-Term Relief)            Interpretative annotation: Clinically appropriate result. Good relief. No permanent benefit expected. Inflammation plays a part in the etiology to the pain.          Current benefits: Defined as reported results that persistent at this point in time.   Analgesia: *** %            Function: Somewhat improved ROM: Somewhat improved Interpretative annotation: Recurrence of symptoms. No permanent benefit expected. Effective diagnostic intervention.          Interpretation: Results would suggest a successful diagnostic intervention.                  Plan:  Please see "Plan of Care" for details.                Laboratory Chemistry  Inflammation Markers (CRP: Acute Phase) (ESR: Chronic Phase) Lab Results   Component Value Date   CRP 4.7 06/09/2017   ESRSEDRATE 16 06/09/2017                         Renal Markers Lab Results  Component Value Date   BUN 5 (L) 06/09/2017   CREATININE 0.62 06/09/2017   BCR 8 (L) 06/09/2017   GFRAA 113 06/09/2017   GFRNONAA 98 06/09/2017                             Hepatic Markers Lab Results  Component Value Date   AST 25 06/09/2017   ALT 18 05/26/2013   ALBUMIN 4.1 06/09/2017                        Neuropathy Markers No results found.  Hematology Parameters Lab Results  Component Value Date   INR 1.2 09/21/2013   LABPROT 14.7 09/21/2013   APTT 33.2 09/09/2013   PLT 253 05/26/2013  HGB 11.7 (L) 05/26/2013   HCT 35.0 05/26/2013                        CV Markers Lab Results  Component Value Date   TROPONINI < 0.02 05/26/2013                         Note: Lab results reviewed.  Recent Diagnostic Imaging Results  DG Shoulder Left CLINICAL DATA:  Chronic left shoulder pain.  No known injury.  EXAM: LEFT SHOULDER - 2+ VIEW  COMPARISON:  None.  FINDINGS: No acute bony or joint abnormality is seen. Mild to moderate acromioclavicular osteoarthritis is noted. The glenohumeral joint appears normal. Imaged lung parenchyma and ribs are unremarkable.  IMPRESSION: Mild to moderate acromioclavicular osteoarthritis. Otherwise negative.  Electronically Signed   By: Inge Rise M.D.   On: 06/10/2017 08:25   DG Shoulder Right CLINICAL DATA:  Chronic right shoulder pain.  No known injury.  EXAM: RIGHT SHOULDER - 2+ VIEW  COMPARISON:  None.  FINDINGS: No acute bony or joint abnormality is seen. The glenohumeral joint is unremarkable. Mild acromioclavicular osteoarthritis is noted. There appears to be a right pleural effusion and basilar airspace disease.  IMPRESSION: Mild acromioclavicular osteoarthritis. The shoulder is otherwise unremarkable.  Likely right pleural effusion and basilar airspace disease. Finding could  be further evaluated with PA and lateral views of the chest.  Electronically Signed   By: Inge Rise M.D.   On: 06/10/2017 08:24   DG Knee 1-2 Views Right CLINICAL DATA:  Chronic right knee pain.  No known injury.  EXAM: RIGHT KNEE - 1-2 VIEW  COMPARISON:  None.  FINDINGS: No acute bony or joint abnormality is identified. The patient has severe osteoarthritis about the knee which is worst in the medial compartment where there is bone-on-bone joint space narrowing. No joint effusion.  IMPRESSION: No acute finding.  Severe tricompartmental osteoarthritis is worst medially.  Electronically Signed   By: Inge Rise M.D.   On: 06/10/2017 08:23   DG Lumbar Spine Complete W/Bend CLINICAL DATA:  Chronic low back pain.  No known injury.  EXAM: LUMBAR SPINE - COMPLETE WITH BENDING VIEWS  COMPARISON:  None.  FINDINGS: No fracture is identified. There is straightening of the normal lumbar lordosis. Severe convex right scoliosis is seen with the apex at L4. Severe loss of disc space height is present at L2-3, L3-4 and L4-5. Scattered facet arthropathy appears worst at L5-S1. Paraspinous structures are unremarkable.  IMPRESSION: No acute abnormality.  Marked convex right scoliosis and severe multilevel degenerative disc disease.  Electronically Signed   By: Inge Rise M.D.   On: 06/10/2017 08:22   DG Cervical Spine With Flex & Extend CLINICAL DATA:  Chronic neck and bilateral shoulder pain. History of prior cervical fusion.  EXAM: CERVICAL SPINE COMPLETE WITH FLEXION AND EXTENSION VIEWS  COMPARISON:  None.  FINDINGS: The patient is status post C5-6 ACDF. The level is solidly fused and hardware is intact. No pathologic motion is seen with flexion or extension. Scattered loss of disc space height and endplate spurring are worst at C4-5 and C6-7. No fracture. Prevertebral soft tissues appear normal.  IMPRESSION: Advanced degenerative disc  disease C4-5. Somewhat milder degree of degenerative disc disease C6-7 is also seen.  Status post solid C5-6 ACDF.  Electronically Signed   By: Inge Rise M.D.   On: 06/10/2017 08:21  Complexity Note: I personally reviewed the  fluoroscopic imaging of the procedure.                        Meds   Current Outpatient Medications:  .  acetaminophen (TYLENOL) 500 MG tablet, Take 500 mg by mouth every 6 (six) hours as needed., Disp: , Rfl:  .  buPROPion (WELLBUTRIN XL) 300 MG 24 hr tablet, Take 300 mg by mouth daily., Disp: , Rfl:  .  Cholecalciferol (VITAMIN D3) 1000 UNITS CAPS, Take by mouth., Disp: , Rfl:  .  citalopram (CELEXA) 40 MG tablet, Take 40 mg by mouth daily., Disp: , Rfl:  .  furosemide (LASIX) 20 MG tablet, Take 20 mg by mouth 2 (two) times daily., Disp: , Rfl:  .  lactulose (CHRONULAC) 10 GM/15ML solution, , Disp: , Rfl:  .  meloxicam (MOBIC) 15 MG tablet, Take 15 mg by mouth daily., Disp: , Rfl:  .  metFORMIN (GLUCOPHAGE) 500 MG tablet, , Disp: , Rfl: 1 .  methocarbamol (ROBAXIN) 750 MG tablet, Take 750 mg by mouth 3 (three) times daily., Disp: , Rfl:  .  pantoprazole (PROTONIX) 40 MG tablet, Take 40 mg by mouth daily., Disp: , Rfl:  .  risperiDONE (RISPERDAL) 2 MG tablet, Take 2 mg by mouth 2 (two) times daily., Disp: , Rfl:  .  spironolactone (ALDACTONE) 25 MG tablet, Take 25 mg by mouth daily. , Disp: , Rfl:  .  XIFAXAN 550 MG TABS tablet, 550 mg 2 (two) times daily. , Disp: , Rfl:   ROS  Constitutional: Denies any fever or chills Gastrointestinal: No reported hemesis, hematochezia, vomiting, or acute GI distress Musculoskeletal: Denies any acute onset joint swelling, redness, loss of ROM, or weakness Neurological: No reported episodes of acute onset apraxia, aphasia, dysarthria, agnosia, amnesia, paralysis, loss of coordination, or loss of consciousness  Allergies  Ms. Antosh is allergic to vicodin [hydrocodone-acetaminophen].  Coin  Drug: Ms. Outen   reports that she does not use drugs. Alcohol:  reports that she does not drink alcohol. Tobacco:  reports that she has never smoked. She has never used smokeless tobacco. Medical:  has a past medical history of Abdominal hernia, Allergy, Anemia, Anginal pain ([place]), CHF (congestive heart failure) ([place]), Colonic polyp, DDD (degenerative disc disease), cervical (ddd), Depression, Diabetes mellitus without complication ([place]), Fibromyalgia, Gout, Hyperlipidemia, Hypertension, IBS (irritable bowel syndrome), Liver cirrhosis ([place]), and Polyneuropathy. Surgical: Ms. Dohn  has a past surgical history that includes Cesarean section [phone]); Spine surgery; Tonsillectomy (1987); Toe amputation (Right, 2015); Hernia repair (2015); Cholecystectomy (2015); Cervical disc surgery; Colonoscopy w/ biopsies and polypectomy (06/10/2013); Esophagogastroduodenoscopy; Knee cartilage surgery (Right); partial amputation; Umbilical hernia repair; and Esophagogastroduodenoscopy (egd) with propofol (N/A, 06/27/2017). Family: family history includes Alcohol abuse in her father and mother; Arthritis in her mother; Colon polyps in her sister; Depression in her father and mother; Diabetes in her mother; Heart disease in her maternal aunt; Hypertension in her mother; Mental illness in her father and mother.  Constitutional Exam  General appearance: Well nourished, well developed, and well hydrated. In no apparent acute distress There were no vitals filed for this visit. BMI Assessment: Estimated body mass index is 42.43 kg/m as calculated from the following:   Height as of 07/22/17: 5' 5"  (1.651 m).   Weight as of 07/22/17: 255 lb (115.7 kg).  BMI interpretation table: BMI level Category Range association with higher incidence of chronic pain  <18 kg/m2 Underweight   18.5-24.9 kg/m2 Ideal body weight   25-29.9 kg/m2  Overweight Increased incidence by 20%  30-34.9 kg/m2 Obese (Class I) Increased incidence by 68%  35-39.9  kg/m2 Severe obesity (Class II) Increased incidence by 136%  >40 kg/m2 Extreme obesity (Class III) Increased incidence by 254%   Patient's current BMI Ideal Body weight  There is no height or weight on file to calculate BMI. Patient weight not recorded   BMI Readings from Last 4 Encounters:  07/22/17 42.43 kg/m  07/14/17 43.27 kg/m  06/27/17 42.43 kg/m  06/09/17 42.77 kg/m   Wt Readings from Last 4 Encounters:  07/22/17 255 lb (115.7 kg)  07/14/17 260 lb (117.9 kg)  06/27/17 255 lb (115.7 kg)  09/21/15 257 lb (116.6 kg)  Psych/Mental status: Alert, oriented x 3 (person, place, & time)       Eyes: PERLA Respiratory: No evidence of acute respiratory distress  Cervical Spine Area Exam  Skin & Axial Inspection: No masses, redness, edema, swelling, or associated skin lesions Alignment: Symmetrical Functional ROM: Unrestricted ROM      Stability: No instability detected Muscle Tone/Strength: Functionally intact. No obvious neuro-muscular anomalies detected. Sensory (Neurological): Unimpaired Palpation: No palpable anomalies              Upper Extremity (UE) Exam    Side: Right upper extremity  Side: Left upper extremity  Skin & Extremity Inspection: Skin color, temperature, and hair growth are WNL. No peripheral edema or cyanosis. No masses, redness, swelling, asymmetry, or associated skin lesions. No contractures.  Skin & Extremity Inspection: Skin color, temperature, and hair growth are WNL. No peripheral edema or cyanosis. No masses, redness, swelling, asymmetry, or associated skin lesions. No contractures.  Functional ROM: Unrestricted ROM          Functional ROM: Unrestricted ROM          Muscle Tone/Strength: Functionally intact. No obvious neuro-muscular anomalies detected.  Muscle Tone/Strength: Functionally intact. No obvious neuro-muscular anomalies detected.  Sensory (Neurological): Unimpaired          Sensory (Neurological): Unimpaired          Palpation: No palpable  anomalies              Palpation: No palpable anomalies              Provocative Test(s):  Phalen's test: deferred Tinel's test: deferred Apley's scratch test (touch opposite shoulder):  Action 1 (Across chest): deferred Action 2 (Overhead): deferred Action 3 (LB reach): deferred   Provocative Test(s):  Phalen's test: deferred Tinel's test: deferred Apley's scratch test (touch opposite shoulder):  Action 1 (Across chest): deferred Action 2 (Overhead): deferred Action 3 (LB reach): deferred    Thoracic Spine Area Exam  Skin & Axial Inspection: No masses, redness, or swelling Alignment: Symmetrical Functional ROM: Unrestricted ROM Stability: No instability detected Muscle Tone/Strength: Functionally intact. No obvious neuro-muscular anomalies detected. Sensory (Neurological): Unimpaired Muscle strength & Tone: No palpable anomalies  Lumbar Spine Area Exam  Skin & Axial Inspection: No masses, redness, or swelling Alignment: Symmetrical Functional ROM: Unrestricted ROM       Stability: No instability detected Muscle Tone/Strength: Functionally intact. No obvious neuro-muscular anomalies detected. Sensory (Neurological): Unimpaired Palpation: No palpable anomalies       Provocative Tests: Lumbar Hyperextension/rotation test: deferred today       Lumbar quadrant test (Kemp's test): deferred today       Lumbar Lateral bending test: deferred today       Patrick's Maneuver: deferred today  FABER test: deferred today       Thigh-thrust test: deferred today       S-I compression test: deferred today       S-I distraction test: deferred today        Gait & Posture Assessment  Ambulation: Unassisted Gait: Relatively normal for age and body habitus Posture: WNL   Lower Extremity Exam    Side: Right lower extremity  Side: Left lower extremity  Stability: No instability observed          Stability: No instability observed          Skin & Extremity Inspection: Skin  color, temperature, and hair growth are WNL. No peripheral edema or cyanosis. No masses, redness, swelling, asymmetry, or associated skin lesions. No contractures.  Skin & Extremity Inspection: Skin color, temperature, and hair growth are WNL. No peripheral edema or cyanosis. No masses, redness, swelling, asymmetry, or associated skin lesions. No contractures.  Functional ROM: Unrestricted ROM                  Functional ROM: Unrestricted ROM                  Muscle Tone/Strength: Functionally intact. No obvious neuro-muscular anomalies detected.  Muscle Tone/Strength: Functionally intact. No obvious neuro-muscular anomalies detected.  Sensory (Neurological): Unimpaired  Sensory (Neurological): Unimpaired  Palpation: No palpable anomalies  Palpation: No palpable anomalies   Assessment  Primary Diagnosis & Pertinent Problem List: The primary encounter diagnosis was Chronic knee pain (Primary Area of Pain) (Right). Diagnoses of Osteoarthritis of knee (Right), Chronic low back pain (Secondary Area of Pain) (Bilateral) (R>L), Chronic neck pain (Tertiary Area of Pain), Chronic shoulder pain (Fourth Area of Pain) (Bilateral) (R>L), and Chronic pain syndrome were also pertinent to this visit.  Status Diagnosis  Controlled Controlled Controlled 1. Chronic knee pain (Primary Area of Pain) (Right)   2. Osteoarthritis of knee (Right)   3. Chronic low back pain (Secondary Area of Pain) (Bilateral) (R>L)   4. Chronic neck pain (Tertiary Area of Pain)   5. Chronic shoulder pain (Fourth Area of Pain) (Bilateral) (R>L)   6. Chronic pain syndrome     Problems updated and reviewed during this visit: No problems updated. Plan of Care  Pharmacotherapy (Medications Ordered): No orders of the defined types were placed in this encounter.  Medications administered today: Gustavus Messing had no medications administered during this visit.  Procedure Orders    No procedure(s) ordered today   Lab Orders  No  laboratory test(s) ordered today   Imaging Orders  No imaging studies ordered today   Referral Orders  No referral(s) requested today    Interventional management options: Planned, scheduled, and/or pending:   ***   Considering:   Diagnosticright intra-articular knee injection #2  Possible series of 5 right intra-articular Hyalgan knee injections  Diagnosticright genicular nerve block  Possibleright genicular nerve RFA  Diagnostic bilateral lumbar facet nerve block  Possiblebilateral lumbar facet RFA  Diagnostic bilateral cervical facet nerve block  Diagnostic bilateral facet RFA  Diagnosticbilateral intra-articular shoulder injections  Diagnostic bilateral suprascapular nerve blocks  Possible bilateral suprascapular nerve RFA    Palliative PRN treatment(s):   None at this time   Provider-requested follow-up: No follow-ups on file.  Future Appointments  Date Time Provider [place]  09/01/2017 10:30 AM Milinda Pointer, MD [facility] None   Primary Care Physician: [facility] [place] Location: [facility] Outpatient Pain Management Facility Note by: Gaspar Cola, MD  Date: 09/01/2017; Time: 7:33 PM

## 2017-09-01 ENCOUNTER — Ambulatory Visit: Payer: Medicare Other | Admitting: Pain Medicine

## 2017-09-16 ENCOUNTER — Ambulatory Visit: Payer: Self-pay | Admitting: Podiatry

## 2017-09-16 ENCOUNTER — Ambulatory Visit: Payer: Medicare Other | Admitting: Physician Assistant

## 2017-09-26 ENCOUNTER — Ambulatory Visit: Payer: Medicare Other | Admitting: Physician Assistant

## 2017-10-10 ENCOUNTER — Ambulatory Visit: Payer: Medicare Other | Admitting: Podiatry

## 2017-10-10 ENCOUNTER — Encounter

## 2017-10-10 DIAGNOSIS — L97512 Non-pressure chronic ulcer of other part of right foot with fat layer exposed: Secondary | ICD-10-CM

## 2017-10-10 DIAGNOSIS — M79609 Pain in unspecified limb: Secondary | ICD-10-CM

## 2017-10-10 DIAGNOSIS — E0842 Diabetes mellitus due to underlying condition with diabetic polyneuropathy: Secondary | ICD-10-CM | POA: Diagnosis not present

## 2017-10-10 DIAGNOSIS — I70235 Atherosclerosis of native arteries of right leg with ulceration of other part of foot: Secondary | ICD-10-CM

## 2017-10-10 DIAGNOSIS — B351 Tinea unguium: Secondary | ICD-10-CM | POA: Diagnosis not present

## 2017-10-12 NOTE — Progress Notes (Signed)
   Subjective:  62 year old female presenting today with a chief complaint of an ulceration of the right hallux. She states the home healthcare nurse believes the wound is getting better. She has been using Medi-honey for treatment. Patient is here for further evaluation and treatment.   Past Medical History:  Diagnosis Date  . Abdominal hernia   . Allergy   . Anemia   . Anginal pain ([place])   . CHF (congestive heart failure) ([place])   . Colonic polyp   . DDD (degenerative disc disease), cervical ddd  . Depression   . Diabetes mellitus without complication ([place])   . Fibromyalgia   . Gout   . Hyperlipidemia   . Hypertension   . IBS (irritable bowel syndrome)   . Liver cirrhosis ([place])   . Polyneuropathy       Objective/Physical Exam General: The patient is alert and oriented x3 in no acute distress.  Dermatology:  Wound #1 noted to the right hallux measuring 1.0 x 1.0 x 0.1 cm (LxWxD).   To the noted ulceration(s), there is no eschar. There is a moderate amount of slough, fibrin, and necrotic tissue noted. Granulation tissue and wound base is red. There is a minimal amount of serosanguineous drainage noted. There is no exposed bone muscle-tendon ligament or joint. There is no malodor. Periwound integrity is intact. Skin is warm, dry and supple bilateral lower extremities. Nails are tender, long, thickened and dystrophic with subungual debris, consistent with onychomycosis, 1-5 bilateral. No signs of infection noted.  Vascular: Palpable pedal pulses bilaterally. No edema or erythema noted. Capillary refill within normal limits.  Neurological: Epicritic and protective threshold diminished bilaterally.   Musculoskeletal Exam: Range of motion within normal limits to all pedal and ankle joints bilateral. Muscle strength 5/5 in all groups bilateral.   Assessment: #1 ulceration of the right hallux secondary to diabetes mellitus #2 diabetes mellitus w/ peripheral neuropathy #3  Onychomycosis of nail due to dermatophyte bilateral  Plan of Care:  #1 Patient was evaluated. #2 medically necessary excisional debridement including subcutaneous tissue was performed using a tissue nipper and a chisel blade. Excisional debridement of all the necrotic nonviable tissue down to healthy bleeding viable tissue was performed with post-debridement measurements same as pre-. #3 the wound was cleansed and dry sterile dressing applied. #4 Continue home healthcare dressing changes twice a week with Medi-honey.  #5 Mechanical debridement of nails 1-5 bilaterally performed using a nail nipper. Filed with dremel without incident.  #6 [facility] is managing diabetic foot ulcer of right hallux. Continue care there.  #7 patient is to return to clinic in 3 months.   Edrick Kins, DPM [facility]  Dr. Edrick Kins, [address]                                        [place], [place] [address]                Office [phone]  Fax [phone]

## 2017-10-25 IMAGING — US US ABDOMEN COMPLETE
1 series · 14 of 25 positions shown · non-contrast
Comparison: Ultrasound 08/08/2015.  CT [DATE].  CT 05/11/2013.

CLINICAL DATA: Cirrhosis.  Cholecystectomy.

EXAM:
ABDOMEN ULTRASOUND COMPLETE

[Series 1: us abdomen complete · 0.25mm/px · 14 of 108 slices shown]
[im 1/108]
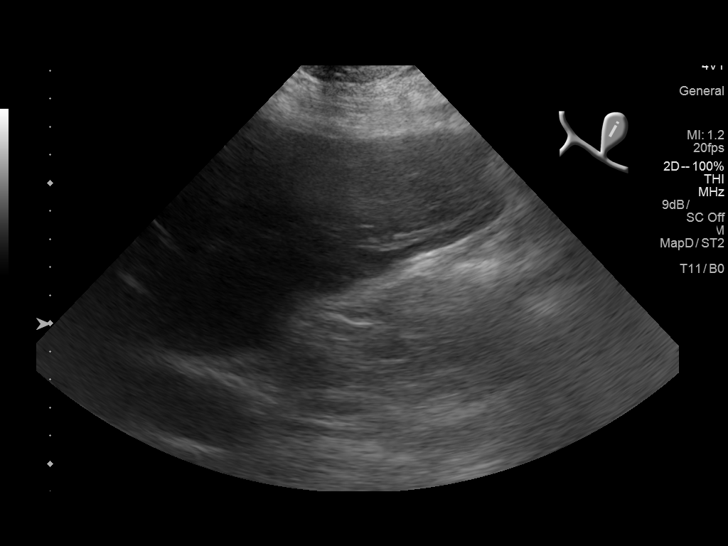
[im 9/108]
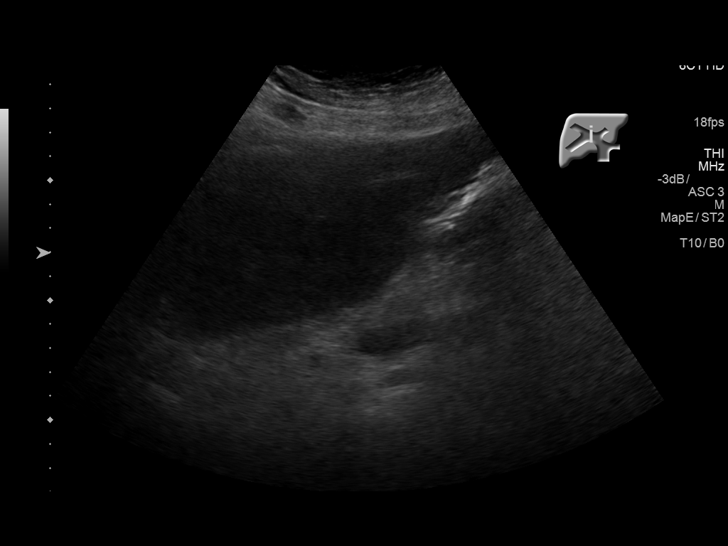
[im 18/108]
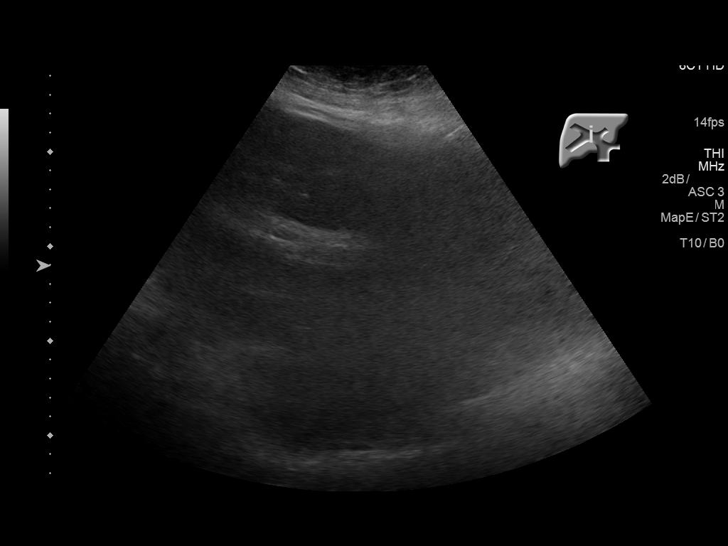
[im 27/108]
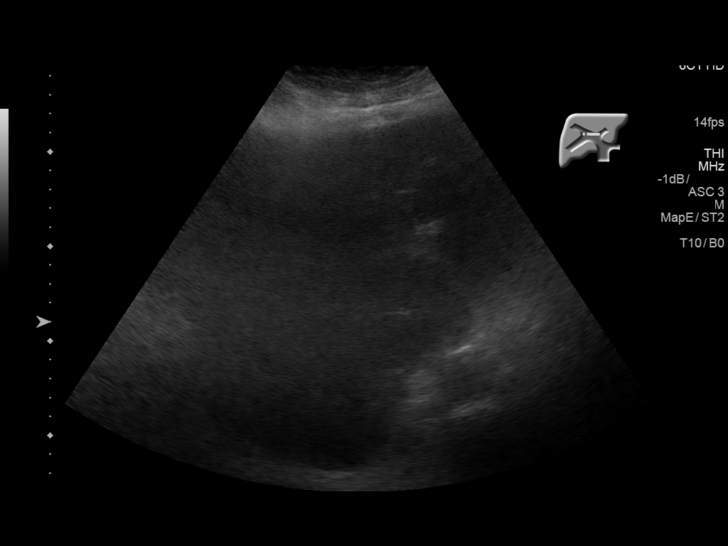
[im 36/108]
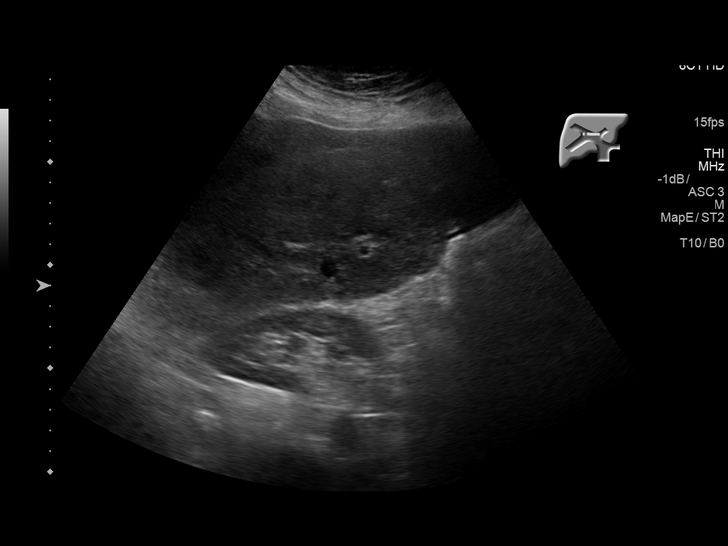
[im 41/108]
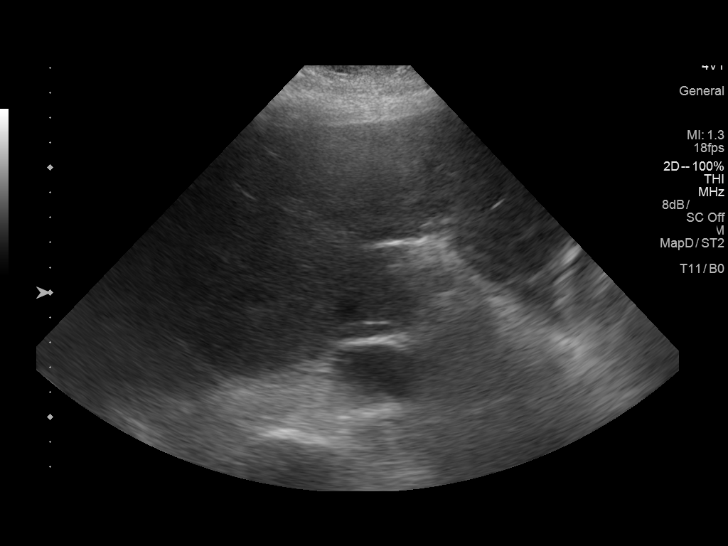
[im 50/108]
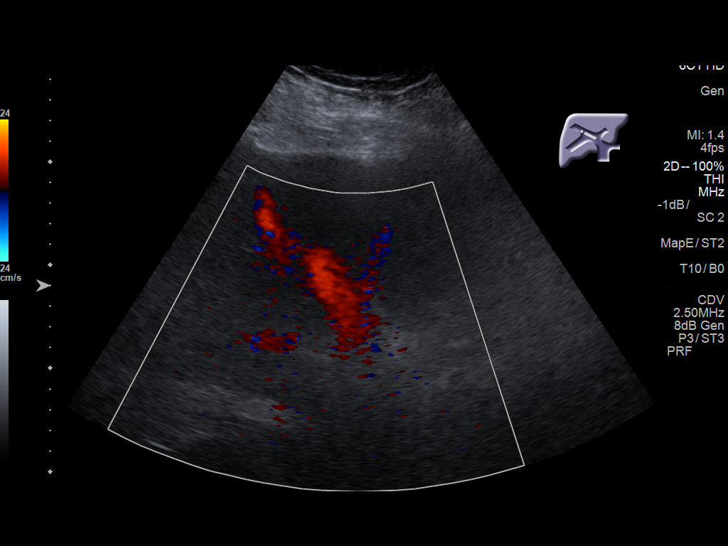
[im 58/108]
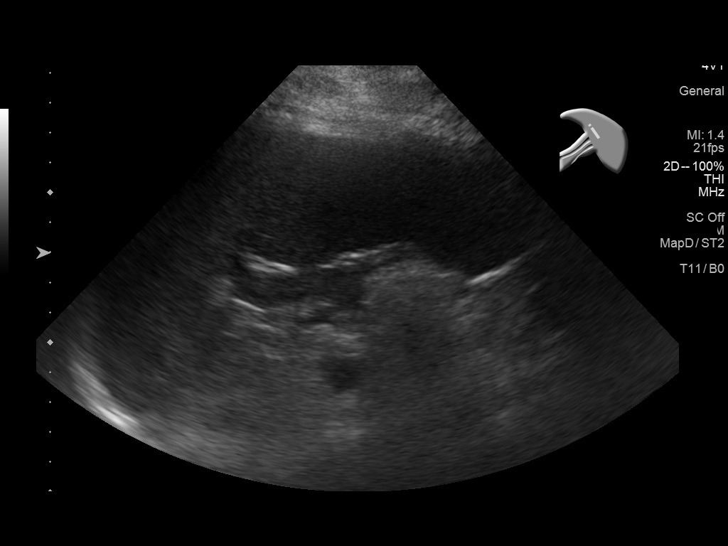
[im 67/108]
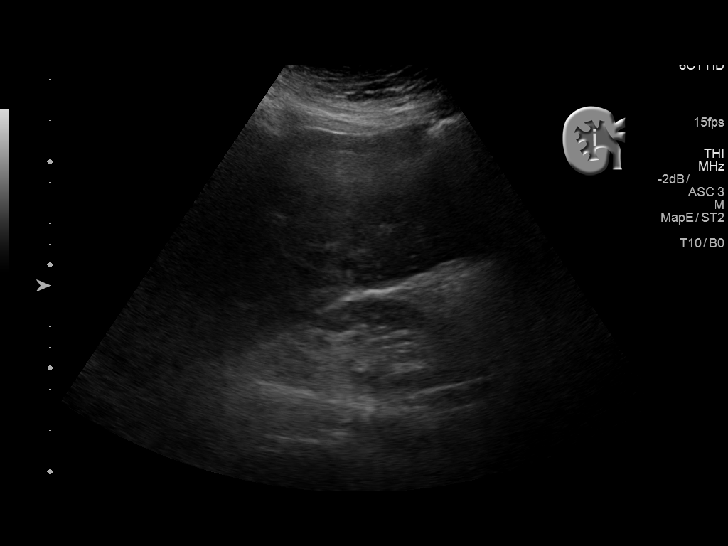
[im 72/108]
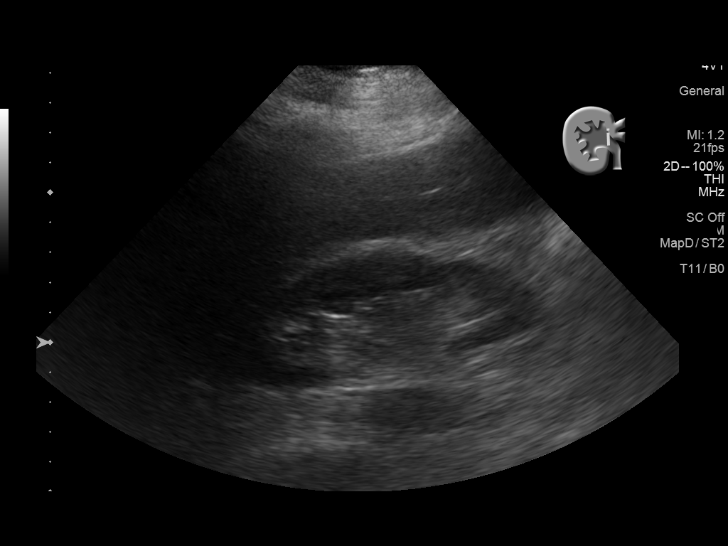
[im 81/108]
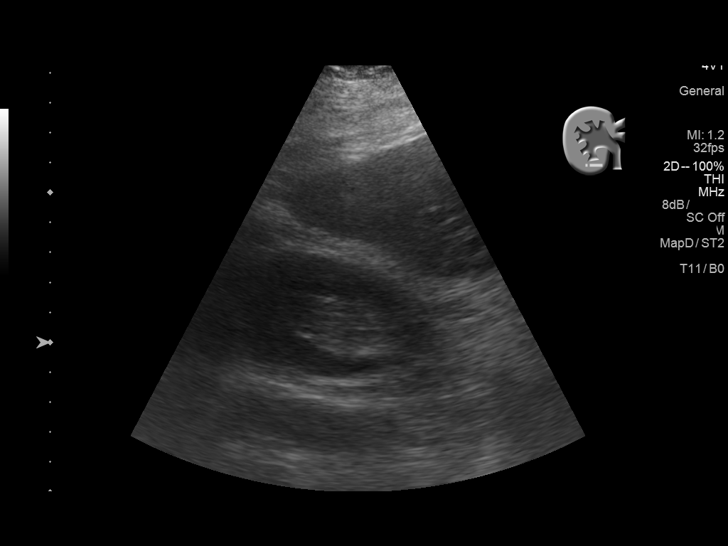
[im 90/108]
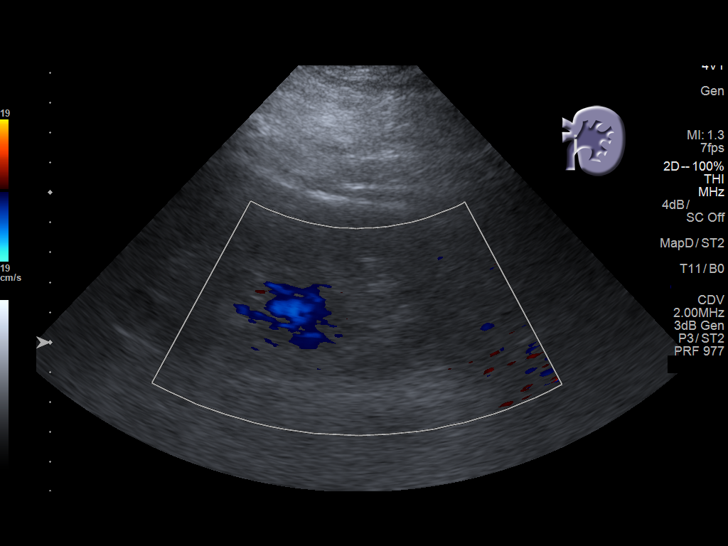
[im 99/108]
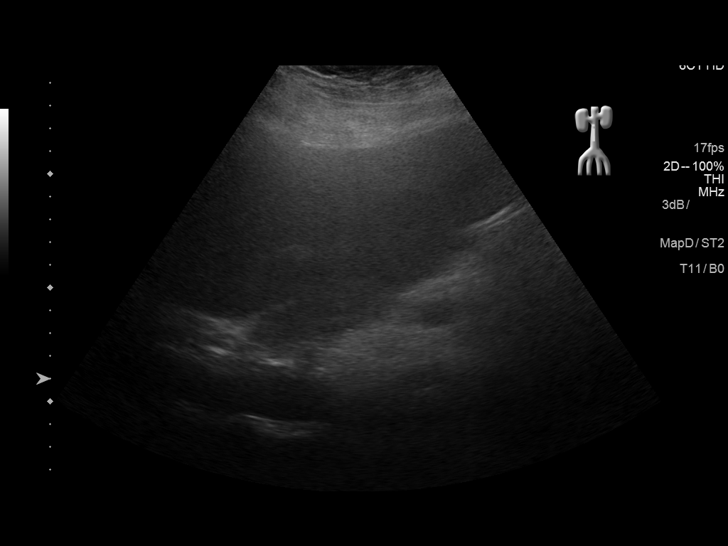
[im 108/108]
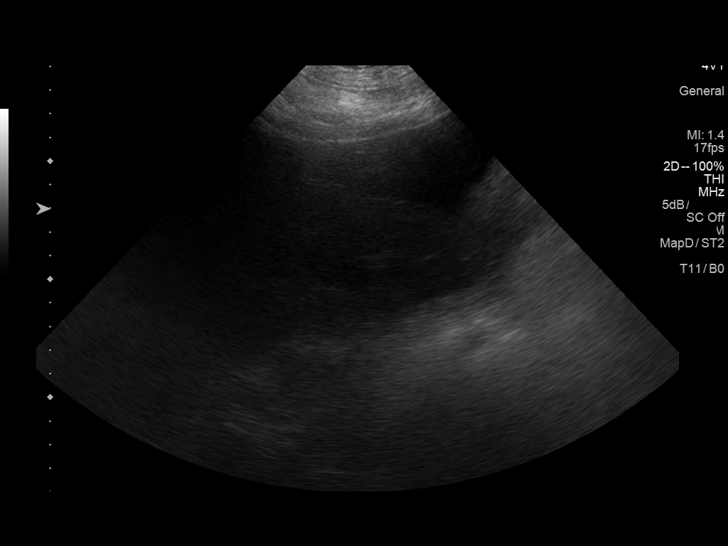

[14 of 25 positions shown; findings below may reference images not displayed]

FINDINGS: Gallbladder: Cholecystectomy.

Common bile duct: Diameter: 3.7 mm.

Liver: Heterogeneous hepatic echotexture consistent patient's known
cirrhosis. Liver is prominent. No focal hepatic abnormality
identified. Portal vein is patent with normal directional flow.

IVC: No abnormality visualized.

Pancreas: Not visualized.

Spleen: 14.3 cm with a volume of 655 cc

Right Kidney: Length: 12.1 cm. Echogenicity within normal limits. No
mass or hydronephrosis visualized.

Left Kidney: Length: 12.7 cm. Echogenicity within normal limits. No
mass or hydronephrosis visualized.

Abdominal aorta: No aneurysm visualized.

Other findings: Mild ascites.  Exam limited to body habitus.
IMPRESSION: 1. Heterogeneous hepatic echotexture consistent with the patient's
known cirrhosis. The liver is prominent. No focal hepatic
abnormality identified. Portal vein is patent with normal
directional flow.

2.  Splenomegaly .

3.  Mild Ascites.

4.  Cholecystectomy.  No biliary distention.

## 2018-01-09 ENCOUNTER — Ambulatory Visit: Payer: Medicare Other | Admitting: Podiatry

## 2018-02-03 ENCOUNTER — Encounter: Payer: Self-pay | Admitting: Podiatry

## 2018-02-03 ENCOUNTER — Ambulatory Visit: Payer: Medicare Other | Admitting: Podiatry

## 2018-02-03 DIAGNOSIS — B351 Tinea unguium: Secondary | ICD-10-CM

## 2018-02-03 DIAGNOSIS — M79609 Pain in unspecified limb: Secondary | ICD-10-CM

## 2018-02-03 DIAGNOSIS — L97512 Non-pressure chronic ulcer of other part of right foot with fat layer exposed: Secondary | ICD-10-CM | POA: Diagnosis not present

## 2018-02-03 DIAGNOSIS — E0842 Diabetes mellitus due to underlying condition with diabetic polyneuropathy: Secondary | ICD-10-CM

## 2018-02-03 DIAGNOSIS — I70235 Atherosclerosis of native arteries of right leg with ulceration of other part of foot: Secondary | ICD-10-CM

## 2018-02-05 NOTE — Progress Notes (Signed)
   Subjective:  62 year old female presenting today for follow up evaluation of an ulceration of the right hallux. She also complains of elongated, thickened nails that cause pain with ambulation. She is unable to trim her own nails. Patient is here for further evaluation and treatment.   Past Medical History:  Diagnosis Date  . Abdominal hernia   . Allergy   . Anemia   . Anginal pain ([place])   . CHF (congestive heart failure) ([place])   . Colonic polyp   . DDD (degenerative disc disease), cervical ddd  . Depression   . Diabetes mellitus without complication ([place])   . Fibromyalgia   . Gout   . Hyperlipidemia   . Hypertension   . IBS (irritable bowel syndrome)   . Liver cirrhosis ([place])   . Polyneuropathy       Objective/Physical Exam General: The patient is alert and oriented x3 in no acute distress.  Dermatology:  Wound #1 noted to the right hallux measuring 0.3 x 0.2 x 0.1 cm (LxWxD).   To the noted ulceration(s), there is no eschar. There is a moderate amount of slough, fibrin, and necrotic tissue noted. Granulation tissue and wound base is red. There is a minimal amount of serosanguineous drainage noted. There is no exposed bone muscle-tendon ligament or joint. There is no malodor. Periwound integrity is intact. Skin is warm, dry and supple bilateral lower extremities. Nails are tender, long, thickened and dystrophic with subungual debris, consistent with onychomycosis, 1-5 bilateral. No signs of infection noted.  Vascular: Palpable pedal pulses bilaterally. No edema or erythema noted. Capillary refill within normal limits.  Neurological: Epicritic and protective threshold diminished bilaterally.   Musculoskeletal Exam: Range of motion within normal limits to all pedal and ankle joints bilateral. Muscle strength 5/5 in all groups bilateral.   Assessment: #1 ulceration of the right hallux secondary to diabetes mellitus #2 diabetes mellitus w/ peripheral neuropathy #3  Onychomycosis of nail due to dermatophyte bilateral  Plan of Care:  #1 Patient was evaluated. #2 medically necessary excisional debridement including subcutaneous tissue was performed using a tissue nipper and a chisel blade. Excisional debridement of all the necrotic nonviable tissue down to healthy bleeding viable tissue was performed with post-debridement measurements same as pre-. #3 the wound was cleansed and dry sterile dressing applied. #4 Recommended antibiotic ointment with a bandage daily.  #5 Mechanical debridement of nails 1-5 bilaterally performed using a nail nipper. Filed with dremel without incident.  #6 Appointment with Liliane Channel, Pedorthist, for DM shoes.  #7 Return to clinic in 3 months.    Edrick Kins, DPM [facility]  Dr. Edrick Kins, [place]                                        [place], [place] [address]                Office [phone]  Fax [phone]

## 2018-02-18 ENCOUNTER — Other Ambulatory Visit: Payer: Medicare Other | Admitting: Orthotics

## 2018-05-05 ENCOUNTER — Ambulatory Visit: Payer: Medicare Other | Admitting: Podiatry

## 2018-05-27 ENCOUNTER — Telehealth: Payer: Self-pay | Admitting: Pharmacy Technician

## 2018-05-27 NOTE — Telephone Encounter (Signed)
Patient called and crying.  Stated that her insurance had been cancelled and wanted to know why.  She stated that she had Medicare but it was cancelled at the beginning of the year.  Made a conference call between patient, myself and IT trainer.  Spoke with Marylyn Ishihara at [employer].  Marylyn Ishihara stated that her Medicare A & B was not cancelled and she should be receiving a letter indicating how she could sign-up for a  Medicare Part D plan.  Patient provided with number for Social Security Administration for follow-up.  [facility]

## 2018-10-14 ENCOUNTER — Other Ambulatory Visit: Payer: Self-pay | Admitting: Gastroenterology

## 2018-10-14 DIAGNOSIS — K7460 Unspecified cirrhosis of liver: Secondary | ICD-10-CM

## 2018-10-20 ENCOUNTER — Ambulatory Visit: Payer: Medicare Other

## 2018-10-27 ENCOUNTER — Ambulatory Visit: Payer: Medicare Other

## 2018-11-03 ENCOUNTER — Ambulatory Visit: Payer: Medicare Other

## 2018-11-10 ENCOUNTER — Ambulatory Visit: Payer: Medicare Other

## 2018-11-10 ENCOUNTER — Emergency Department: Payer: Medicare Other

## 2018-11-10 ENCOUNTER — Encounter: Payer: Self-pay | Admitting: Emergency Medicine

## 2018-11-10 ENCOUNTER — Inpatient Hospital Stay
Admission: EM | Admit: 2018-11-10 | Discharge: 2018-11-12 | DRG: 641 | Disposition: A | Discharge status: Home Health | Payer: Medicare Other | Attending: Internal Medicine | Admitting: Internal Medicine

## 2018-11-10 ENCOUNTER — Other Ambulatory Visit: Payer: Self-pay

## 2018-11-10 DIAGNOSIS — E785 Hyperlipidemia, unspecified: Secondary | ICD-10-CM | POA: Diagnosis present

## 2018-11-10 DIAGNOSIS — B9620 Unspecified Escherichia coli [E. coli] as the cause of diseases classified elsewhere: Secondary | ICD-10-CM | POA: Diagnosis present

## 2018-11-10 DIAGNOSIS — Z89429 Acquired absence of other toe(s), unspecified side: Secondary | ICD-10-CM | POA: Diagnosis not present

## 2018-11-10 DIAGNOSIS — K589 Irritable bowel syndrome without diarrhea: Secondary | ICD-10-CM | POA: Diagnosis present

## 2018-11-10 DIAGNOSIS — Z79899 Other long term (current) drug therapy: Secondary | ICD-10-CM | POA: Diagnosis not present

## 2018-11-10 DIAGNOSIS — I110 Hypertensive heart disease with heart failure: Secondary | ICD-10-CM | POA: Diagnosis present

## 2018-11-10 DIAGNOSIS — Z8249 Family history of ischemic heart disease and other diseases of the circulatory system: Secondary | ICD-10-CM

## 2018-11-10 DIAGNOSIS — N390 Urinary tract infection, site not specified: Secondary | ICD-10-CM | POA: Diagnosis present

## 2018-11-10 DIAGNOSIS — K7460 Unspecified cirrhosis of liver: Secondary | ICD-10-CM | POA: Diagnosis present

## 2018-11-10 DIAGNOSIS — Z20828 Contact with and (suspected) exposure to other viral communicable diseases: Secondary | ICD-10-CM | POA: Diagnosis present

## 2018-11-10 DIAGNOSIS — K219 Gastro-esophageal reflux disease without esophagitis: Secondary | ICD-10-CM | POA: Diagnosis present

## 2018-11-10 DIAGNOSIS — I509 Heart failure, unspecified: Secondary | ICD-10-CM | POA: Diagnosis present

## 2018-11-10 DIAGNOSIS — Z811 Family history of alcohol abuse and dependence: Secondary | ICD-10-CM

## 2018-11-10 DIAGNOSIS — Z9049 Acquired absence of other specified parts of digestive tract: Secondary | ICD-10-CM | POA: Diagnosis not present

## 2018-11-10 DIAGNOSIS — Z885 Allergy status to narcotic agent status: Secondary | ICD-10-CM

## 2018-11-10 DIAGNOSIS — R531 Weakness: Secondary | ICD-10-CM

## 2018-11-10 DIAGNOSIS — Z7984 Long term (current) use of oral hypoglycemic drugs: Secondary | ICD-10-CM | POA: Diagnosis not present

## 2018-11-10 DIAGNOSIS — E871 Hypo-osmolality and hyponatremia: Secondary | ICD-10-CM | POA: Diagnosis not present

## 2018-11-10 DIAGNOSIS — Z833 Family history of diabetes mellitus: Secondary | ICD-10-CM

## 2018-11-10 DIAGNOSIS — Z8371 Family history of colonic polyps: Secondary | ICD-10-CM | POA: Diagnosis not present

## 2018-11-10 DIAGNOSIS — D638 Anemia in other chronic diseases classified elsewhere: Secondary | ICD-10-CM | POA: Diagnosis present

## 2018-11-10 DIAGNOSIS — Z8261 Family history of arthritis: Secondary | ICD-10-CM

## 2018-11-10 DIAGNOSIS — Z818 Family history of other mental and behavioral disorders: Secondary | ICD-10-CM | POA: Diagnosis not present

## 2018-11-10 DIAGNOSIS — F329 Major depressive disorder, single episode, unspecified: Secondary | ICD-10-CM | POA: Diagnosis present

## 2018-11-10 DIAGNOSIS — E1142 Type 2 diabetes mellitus with diabetic polyneuropathy: Secondary | ICD-10-CM | POA: Diagnosis not present

## 2018-11-10 LAB — HEMOGLOBIN A1C
Hgb A1c MFr Bld: 5.1 % (ref 4.8–5.6)
Mean Plasma Glucose: 99.67 mg/dL

## 2018-11-10 LAB — URINALYSIS, COMPLETE (UACMP) WITH MICROSCOPIC
Bilirubin Urine: NEGATIVE
Glucose, UA: NEGATIVE mg/dL
Hgb urine dipstick: NEGATIVE
Ketones, ur: 20 mg/dL — AB
Leukocytes,Ua: NEGATIVE
Nitrite: POSITIVE — AB
Protein, ur: NEGATIVE mg/dL
Specific Gravity, Urine: 1.009 (ref 1.005–1.030)
pH: 7 (ref 5.0–8.0)

## 2018-11-10 LAB — TSH: TSH: 4.771 u[IU]/mL — ABNORMAL HIGH (ref 0.350–4.500)

## 2018-11-10 LAB — CBC WITH DIFFERENTIAL/PLATELET
Abs Immature Granulocytes: 0.05 10*3/uL (ref 0.00–0.07)
Basophils Absolute: 0.1 10*3/uL (ref 0.0–0.1)
Basophils Relative: 1 %
Eosinophils Absolute: 0.1 10*3/uL (ref 0.0–0.5)
Eosinophils Relative: 1 %
HCT: 31.5 % — ABNORMAL LOW (ref 36.0–46.0)
Hemoglobin: 10.9 g/dL — ABNORMAL LOW (ref 12.0–15.0)
Immature Granulocytes: 1 %
Lymphocytes Relative: 8 %
Lymphs Abs: 0.8 10*3/uL (ref 0.7–4.0)
MCH: 31.1 pg (ref 26.0–34.0)
MCHC: 34.6 g/dL (ref 30.0–36.0)
MCV: 89.7 fL (ref 80.0–100.0)
Monocytes Absolute: 0.7 10*3/uL (ref 0.1–1.0)
Monocytes Relative: 7 %
Neutro Abs: 7.9 10*3/uL — ABNORMAL HIGH (ref 1.7–7.7)
Neutrophils Relative %: 82 %
Platelets: 178 10*3/uL (ref 150–400)
RBC: 3.51 MIL/uL — ABNORMAL LOW (ref 3.87–5.11)
RDW: 12.1 % (ref 11.5–15.5)
WBC: 9.5 10*3/uL (ref 4.0–10.5)
nRBC: 0 % (ref 0.0–0.2)

## 2018-11-10 LAB — COMPREHENSIVE METABOLIC PANEL
ALT: 13 U/L (ref 0–44)
AST: 24 U/L (ref 15–41)
Albumin: 4.3 g/dL (ref 3.5–5.0)
Alkaline Phosphatase: 115 U/L (ref 38–126)
Anion gap: 12 (ref 5–15)
BUN: 8 mg/dL (ref 8–23)
CO2: 23 mmol/L (ref 22–32)
Calcium: 9.2 mg/dL (ref 8.9–10.3)
Chloride: 88 mmol/L — ABNORMAL LOW (ref 98–111)
Creatinine, Ser: 0.41 mg/dL — ABNORMAL LOW (ref 0.44–1.00)
GFR calc Af Amer: >60 mL/min (ref >60)
GFR calc non Af Amer: >60 mL/min (ref >60)
Glucose, Bld: 124 mg/dL — ABNORMAL HIGH (ref 70–99)
Potassium: 3.8 mmol/L (ref 3.5–5.1)
Sodium: 123 mmol/L — ABNORMAL LOW (ref 135–145)
Total Bilirubin: 1 mg/dL (ref 0.3–1.2)
Total Protein: 7.3 g/dL (ref 6.5–8.1)

## 2018-11-10 LAB — GLUCOSE, CAPILLARY
Glucose-Capillary: 132 mg/dL — ABNORMAL HIGH (ref 70–99)
Glucose-Capillary: 132 mg/dL — ABNORMAL HIGH (ref 70–99)

## 2018-11-10 LAB — TROPONIN I (HIGH SENSITIVITY): Troponin I (High Sensitivity): 3 ng/L (ref <18)

## 2018-11-10 LAB — SARS CORONAVIRUS 2 BY RT PCR (HOSPITAL ORDER, PERFORMED IN ~~LOC~~ HOSPITAL LAB): SARS Coronavirus 2: NEGATIVE

## 2018-11-10 MED ORDER — METFORMIN HCL 500 MG PO TABS
500.0000 mg | ORAL_TABLET | Freq: Every day | ORAL | Status: DC
  Administered 2018-11-10: 17:00:00 500 mg via ORAL
  Filled 2018-11-10: qty 1

## 2018-11-10 MED ORDER — RISPERIDONE 1 MG PO TABS
2.0000 mg | ORAL_TABLET | Freq: Two times a day (BID) | ORAL | Status: DC
  Administered 2018-11-10 – 2018-11-12 (×4): 2 mg via ORAL
  Filled 2018-11-10 (×5): qty 2

## 2018-11-10 MED ORDER — SODIUM CHLORIDE 0.9 % IV SOLN
1.0000 g | Freq: Once | INTRAVENOUS | Status: DC
  Filled 2018-11-10: qty 10

## 2018-11-10 MED ORDER — PNEUMOCOCCAL VAC POLYVALENT 25 MCG/0.5ML IJ INJ
0.5000 mL | INJECTION | INTRAMUSCULAR | Status: DC
  Filled 2018-11-10: qty 0.5

## 2018-11-10 MED ORDER — METHOCARBAMOL 750 MG PO TABS
750.0000 mg | ORAL_TABLET | Freq: Three times a day (TID) | ORAL | Status: DC
  Administered 2018-11-10 – 2018-11-12 (×5): 750 mg via ORAL
  Filled 2018-11-10 (×7): qty 1

## 2018-11-10 MED ORDER — BUPROPION HCL ER (XL) 150 MG PO TB24
300.0000 mg | ORAL_TABLET | Freq: Every day | ORAL | Status: DC
  Administered 2018-11-10 – 2018-11-12 (×3): 300 mg via ORAL
  Filled 2018-11-10 (×3): qty 2

## 2018-11-10 MED ORDER — INSULIN ASPART 100 UNIT/ML ~~LOC~~ SOLN: 0.0000 [IU] | Freq: Every day | SUBCUTANEOUS | Status: DC

## 2018-11-10 MED ORDER — CITALOPRAM HYDROBROMIDE 20 MG PO TABS
40.0000 mg | ORAL_TABLET | Freq: Every day | ORAL | Status: DC
  Administered 2018-11-10 – 2018-11-12 (×3): 40 mg via ORAL
  Filled 2018-11-10 (×3): qty 2

## 2018-11-10 MED ORDER — SODIUM CHLORIDE 0.9 % IV SOLN
INTRAVENOUS | Status: DC
  Administered 2018-11-10 – 2018-11-11 (×3): via INTRAVENOUS

## 2018-11-10 MED ORDER — DOCUSATE SODIUM 100 MG PO CAPS: 100.0000 mg | ORAL_CAPSULE | Freq: Two times a day (BID) | ORAL | Status: DC | PRN

## 2018-11-10 MED ORDER — PANTOPRAZOLE SODIUM 40 MG PO TBEC
40.0000 mg | DELAYED_RELEASE_TABLET | Freq: Every day | ORAL | Status: DC
  Administered 2018-11-10 – 2018-11-12 (×3): 40 mg via ORAL
  Filled 2018-11-10 (×3): qty 1

## 2018-11-10 MED ORDER — RIFAXIMIN 550 MG PO TABS
550.0000 mg | ORAL_TABLET | Freq: Two times a day (BID) | ORAL | Status: DC
  Administered 2018-11-10 – 2018-11-12 (×4): 550 mg via ORAL
  Filled 2018-11-10 (×5): qty 1

## 2018-11-10 MED ORDER — LACTULOSE 10 GM/15ML PO SOLN
30.0000 g | Freq: Three times a day (TID) | ORAL | Status: DC
  Administered 2018-11-10 – 2018-11-12 (×6): 30 g via ORAL
  Filled 2018-11-10 (×6): qty 60

## 2018-11-10 MED ORDER — HEPARIN SODIUM (PORCINE) 5000 UNIT/ML IJ SOLN
5000.0000 [IU] | Freq: Three times a day (TID) | INTRAMUSCULAR | Status: DC
  Administered 2018-11-10 – 2018-11-12 (×5): 5000 [IU] via SUBCUTANEOUS
  Filled 2018-11-10 (×5): qty 1

## 2018-11-10 MED ORDER — INSULIN ASPART 100 UNIT/ML ~~LOC~~ SOLN
0.0000 [IU] | Freq: Three times a day (TID) | SUBCUTANEOUS | Status: DC
  Administered 2018-11-10 – 2018-11-12 (×5): 1 [IU] via SUBCUTANEOUS
  Filled 2018-11-10 (×5): qty 1

## 2018-11-10 NOTE — ED Notes (Signed)
Pt up to the bathroom at this time.

## 2018-11-10 NOTE — ED Provider Notes (Signed)
[facility] Emergency Department Provider Note   ____________________________________________    I have reviewed the triage vital signs and the nursing notes.   HISTORY  Chief Complaint Weakness     HPI [name] is a 63 y.o. female with a history of diabetes, anemia, hypertension who presents with complaints of generalized weakness.  Patient reports she has been feeling weak with low energy over the weekend.  She denies chest pain.  No fevers although does report that she has had some sweats.  No cough or shortness of breath.  No dysuria.  Just describes a generalized weakness that makes it difficult for her to take care of herself  Past Medical History:  Diagnosis Date  . Abdominal hernia   . Allergy   . Anemia   . Anginal pain ([place])   . CHF (congestive heart failure) ([place])   . Colonic polyp   . DDD (degenerative disc disease), cervical ddd  . Depression   . Diabetes mellitus without complication ([place])   . Fibromyalgia   . Gout   . Hyperlipidemia   . Hypertension   . IBS (irritable bowel syndrome)   . Liver cirrhosis ([place])   . Polyneuropathy     Patient Active Problem List   Diagnosis Date Noted  . Hyponatremia 11/10/2018  . Osteoarthritis of knee (Right) 07/14/2017  . Tricompartment osteoarthritis of knee (Right) 07/14/2017  . Chronic knee pain (Primary Area of Pain) (Right) 06/09/2017  . Chronic low back pain (Secondary Area of Pain) (Bilateral) (R>L) 06/09/2017  . Chronic neck pain (Tertiary Area of Pain) 06/09/2017  . Chronic shoulder pain (Fourth Area of Pain) (Bilateral) (R>L) 06/09/2017  . Chronic pain syndrome 06/09/2017  . Pharmacologic therapy 06/09/2017  . Disorder of skeletal system 06/09/2017  . Problems influencing health status 06/09/2017  . DJD (degenerative joint disease) of knee 09/21/2015  . Diabetes mellitus ([place]) 09/21/2015  . Fibromyalgia 09/21/2015  . Cirrhosis of liver ([place]) 09/21/2015  . Hepatic  cirrhosis ([place]) 11/18/2014  . Polyp of colon 11/18/2014    Past Surgical History:  Procedure Laterality Date  . CERVICAL DISC SURGERY    . CESAREAN SECTION  [id number]  . CHOLECYSTECTOMY  2015  . COLONOSCOPY W/ BIOPSIES AND POLYPECTOMY  06/10/2013  . ESOPHAGOGASTRODUODENOSCOPY    . ESOPHAGOGASTRODUODENOSCOPY (EGD) WITH PROPOFOL N/A 06/27/2017   Procedure: ESOPHAGOGASTRODUODENOSCOPY (EGD) WITH PROPOFOL;  Surgeon: Lollie Sails, MD;  Location: [facility] ENDOSCOPY;  Service: Endoscopy;  Laterality: N/A;  . HERNIA REPAIR  2015  . KNEE CARTILAGE SURGERY Right   . partial amputation     toe surgery  . SPINE SURGERY    . TOE AMPUTATION Right 2015   partial amputation 3rd toe  . TONSILLECTOMY  1987  . UMBILICAL HERNIA REPAIR      Prior to Admission medications   Medication Sig Start Date End Date Taking? Authorizing Provider  acetaminophen (TYLENOL) 500 MG tablet Take 500 mg by mouth 3 (three) times daily.    Yes [provider]  buPROPion (WELLBUTRIN XL) 300 MG 24 hr tablet Take 300 mg by mouth daily.   Yes [provider]  citalopram (CELEXA) 40 MG tablet Take 40 mg by mouth daily.   Yes [provider]  furosemide (LASIX) 20 MG tablet Take 20 mg by mouth 2 (two) times daily.   Yes [provider]  lactulose (CHRONULAC) 10 GM/15ML solution Take 30 g by mouth 3 (three) times daily.  05/21/13  Yes [provider]  metFORMIN (GLUCOPHAGE) 500 MG tablet Take 500 mg by mouth daily.  07/12/14  Yes [provider]  methocarbamol (ROBAXIN) 750 MG tablet Take 750 mg by mouth 3 (three) times daily.   Yes [provider]  pantoprazole (PROTONIX) 40 MG tablet Take 40 mg by mouth daily. 07/01/17 11/10/18 Yes [provider]  risperiDONE (RISPERDAL) 2 MG tablet Take 2 mg by mouth 2 (two) times daily.   Yes [provider]  spironolactone (ALDACTONE) 50 MG tablet Take 50 mg by mouth daily.  05/21/13  Yes [provider]   XIFAXAN 550 MG TABS tablet 550 mg 2 (two) times daily.  06/05/13  Yes [provider]     Allergies Vicodin [hydrocodone-acetaminophen]  Family History  Problem Relation Age of Onset  . Alcohol abuse Mother   . Arthritis Mother   . Depression Mother   . Diabetes Mother   . Hypertension Mother   . Mental illness Mother   . Alcohol abuse Father   . Depression Father   . Mental illness Father   . Colon polyps Sister   . Heart disease Maternal Aunt     Social History Social History   Tobacco Use  . Smoking status: Never Smoker  . Smokeless tobacco: Never Used  Substance Use Topics  . Alcohol use: No  . Drug use: No    Review of Systems  Constitutional: No fever/chills Eyes: No visual changes.  ENT: No sore throat. Cardiovascular: Denies chest pain. Respiratory: Denies shortness of breath. Gastrointestinal: No abdominal pain.  Genitourinary: Negative for dysuria. Musculoskeletal: Negative for back pain. Skin: Negative for rash. Neurological: Negative for headaches   ____________________________________________   PHYSICAL EXAM:  VITAL SIGNS: ED Triage Vitals [11/10/18 1140]  Enc Vitals Group     BP 133/89     Pulse Rate (!) 108     Resp 20     Temp 98 F (36.7 C)     Temp Source Oral     SpO2 92 %     Weight      Height 1.651 m (5' 5" )     Head Circumference      Peak Flow      Pain Score 8     Pain Loc      Pain Edu?      Excl. in [place]?     Constitutional: Alert and oriented. Eyes: Conjunctivae are normal.   Nose: No congestion/rhinnorhea. Mouth/Throat: Mucous membranes are moist.    Cardiovascular: Normal rate, regular rhythm. Grossly normal heart sounds.  Good peripheral circulation. Respiratory: Normal respiratory effort.  No retractions. Lungs CTAB. Gastrointestinal: Soft and nontender. No distention.  No CVA tenderness.  Musculoskeletal: No lower extremity tenderness nor edema.  Warm and well perfused Neurologic:  Normal speech  and language. No gross focal neurologic deficits are appreciated.  Skin:  Skin is warm, dry and intact. No rash noted. Psychiatric: Mood and affect are normal. Speech and behavior are normal.  ____________________________________________   LABS (all labs ordered are listed, but only abnormal results are displayed)  Labs Reviewed  CBC WITH DIFFERENTIAL/PLATELET - Abnormal; Notable for the following components:      Result Value   RBC 3.51 (*)    Hemoglobin 10.9 (*)    HCT 31.5 (*)    Neutro Abs 7.9 (*)    All other components within normal limits  COMPREHENSIVE METABOLIC PANEL - Abnormal; Notable for the following components:   Sodium 123 (*)    Chloride 88 (*)  Glucose, Bld 124 (*)    Creatinine, Ser 0.41 (*)    All other components within normal limits  SARS CORONAVIRUS 2 (HOSPITAL ORDER, PERFORMED IN [place] LAB)  URINALYSIS, COMPLETE (UACMP) WITH MICROSCOPIC  TSH  TROPONIN I (HIGH SENSITIVITY)   ____________________________________________  EKG  None ____________________________________________  RADIOLOGY  Chest x-ray unremarkable ____________________________________________   PROCEDURES  Procedure(s) performed: No  Procedures   Critical Care performed: No ____________________________________________   INITIAL IMPRESSION / ASSESSMENT AND PLAN / ED COURSE  Pertinent labs & imaging results that were available during my care of the patient were reviewed by me and considered in my medical decision making (see chart for details).   Patient's exam is otherwise unremarkable, vital signs reassuring.  Chest x-ray, labs pending   Lab work notable for significant hyponatremia of 123, this is likely the cause of her generalized weakness, she will require admission, have discussed with hospitalist service    ____________________________________________   FINAL CLINICAL IMPRESSION(S) / ED DIAGNOSES  Final diagnoses:  Generalized weakness   Hyponatremia        Note:  This document was prepared using Dragon voice recognition software and may include unintentional dictation errors.   Lavonia Drafts, MD 11/10/18 [phone]

## 2018-11-10 NOTE — ED Notes (Signed)
Xray at bedside. Pt verbalized understanding that a urine sample was needed. Call bell in reach.

## 2018-11-10 NOTE — ED Notes (Signed)
MD at bedside. 

## 2018-11-10 NOTE — ED Notes (Signed)
Pt given meal tray and taken to the bathroom by this RN. Pt able to ambulate a short distance without difficulty. Pt verbalized concern that she was too weak to ambulate but for the short distance between the bed and the toilet pt had no difficulty with one assist. Pt sitting on the edge of bed at this time with call bell in reach.

## 2018-11-10 NOTE — H&P (Signed)
[name] at [place] NAME: [name]    MR#:  [id number]  DATE OF BIRTH:  25-Feb-1956  DATE OF ADMISSION:  11/10/2018  PRIMARY CARE PHYSICIAN: [facility] [place]   REQUESTING/REFERRING PHYSICIAN: [place]  CHIEF COMPLAINT:   Chief Complaint  Patient presents with  . Weakness    HISTORY OF PRESENT ILLNESS: [name]  is a 63 y.o. female with a known history of anemia, anginal pain, CHF, degenerative disc disease, depression, diabetes, hyperlipidemia, hypertension, irritable bowel syndrome, liver cirrhosis, polyneuropathy-her primary care doctor made some changes in her medications few weeks ago where they decreased Lasix and added some other medicine as fluid pills.  They also took her off of sodium supplements. She is taking her medication as prescribed.  Last 3 to 4 days she is feeling increasingly weak and have some chills.  She denies any abdominal pain, diarrhea nausea vomiting, cough, fever. At her baseline she walks with a walker but now she is more fatigued and concerned with this she came to emergency room.  She was noted to have low sodium level and so advised to admit to hospitalist team.  PAST MEDICAL HISTORY:   Past Medical History:  Diagnosis Date  . Abdominal hernia   . Allergy   . Anemia   . Anginal pain ([place])   . CHF (congestive heart failure) ([place])   . Colonic polyp   . DDD (degenerative disc disease), cervical ddd  . Depression   . Diabetes mellitus without complication ([place])   . Fibromyalgia   . Gout   . Hyperlipidemia   . Hypertension   . IBS (irritable bowel syndrome)   . Liver cirrhosis ([place])   . Polyneuropathy     PAST SURGICAL HISTORY:  Past Surgical History:  Procedure Laterality Date  . CERVICAL DISC SURGERY    . CESAREAN SECTION  [id number]  . CHOLECYSTECTOMY  2015  . COLONOSCOPY W/ BIOPSIES AND POLYPECTOMY  06/10/2013  . ESOPHAGOGASTRODUODENOSCOPY    . ESOPHAGOGASTRODUODENOSCOPY  (EGD) WITH PROPOFOL N/A 06/27/2017   Procedure: ESOPHAGOGASTRODUODENOSCOPY (EGD) WITH PROPOFOL;  Surgeon: Lollie Sails, MD;  Location: [facility] ENDOSCOPY;  Service: Endoscopy;  Laterality: N/A;  . HERNIA REPAIR  2015  . KNEE CARTILAGE SURGERY Right   . partial amputation     toe surgery  . SPINE SURGERY    . TOE AMPUTATION Right 2015   partial amputation 3rd toe  . TONSILLECTOMY  1987  . UMBILICAL HERNIA REPAIR      SOCIAL HISTORY:  Social History   Tobacco Use  . Smoking status: Never Smoker  . Smokeless tobacco: Never Used  Substance Use Topics  . Alcohol use: No    FAMILY HISTORY:  Family History  Problem Relation Age of Onset  . Alcohol abuse Mother   . Arthritis Mother   . Depression Mother   . Diabetes Mother   . Hypertension Mother   . Mental illness Mother   . Alcohol abuse Father   . Depression Father   . Mental illness Father   . Colon polyps Sister   . Heart disease Maternal Aunt     DRUG ALLERGIES:  Allergies  Allergen Reactions  . Vicodin [Hydrocodone-Acetaminophen] Other (See Comments) and Rash    nightmares nightmares Per patient she crawls all over the bed    REVIEW OF SYSTEMS:   CONSTITUTIONAL: No fever, have fatigue or weakness.  EYES: No blurred or double vision.  EARS, NOSE, AND THROAT: No tinnitus  or ear pain.  RESPIRATORY: No cough, shortness of breath, wheezing or hemoptysis.  CARDIOVASCULAR: No chest pain, orthopnea, edema.  GASTROINTESTINAL: No nausea, vomiting, diarrhea or abdominal pain.  GENITOURINARY: No dysuria, hematuria.  ENDOCRINE: No polyuria, nocturia,  HEMATOLOGY: No anemia, easy bruising or bleeding SKIN: No rash or lesion. MUSCULOSKELETAL: No joint pain or arthritis.   NEUROLOGIC: No tingling, numbness, weakness.  PSYCHIATRY: No anxiety or depression.   MEDICATIONS AT HOME:  Prior to Admission medications   Medication Sig Start Date End Date Taking? Authorizing Provider  acetaminophen (TYLENOL) 500 MG tablet  Take 500 mg by mouth 3 (three) times daily.    Yes [provider]  buPROPion (WELLBUTRIN XL) 300 MG 24 hr tablet Take 300 mg by mouth daily.   Yes [provider]  citalopram (CELEXA) 40 MG tablet Take 40 mg by mouth daily.   Yes [provider]  furosemide (LASIX) 20 MG tablet Take 20 mg by mouth 2 (two) times daily.   Yes [provider]  lactulose (CHRONULAC) 10 GM/15ML solution Take 30 g by mouth 3 (three) times daily.  05/21/13  Yes [provider]  metFORMIN (GLUCOPHAGE) 500 MG tablet Take 500 mg by mouth daily.  07/12/14  Yes [provider]  methocarbamol (ROBAXIN) 750 MG tablet Take 750 mg by mouth 3 (three) times daily.   Yes [provider]  pantoprazole (PROTONIX) 40 MG tablet Take 40 mg by mouth daily. 07/01/17 11/10/18 Yes [provider]  risperiDONE (RISPERDAL) 2 MG tablet Take 2 mg by mouth 2 (two) times daily.   Yes [provider]  spironolactone (ALDACTONE) 50 MG tablet Take 50 mg by mouth daily.  05/21/13  Yes [provider]  XIFAXAN 550 MG TABS tablet 550 mg 2 (two) times daily.  06/05/13  Yes [provider]      PHYSICAL EXAMINATION:   VITAL SIGNS: Blood pressure (!) 144/81, pulse 98, temperature 98 F (36.7 C), temperature source Oral, resp. rate (!) 23, height 5' 5"  (1.651 m), SpO2 96 %.  GENERAL:  64 y.o.-year-old obese patient lying in the bed with no acute distress.  EYES: Pupils equal, round, reactive to light and accommodation. No scleral icterus. Extraocular muscles intact.  HEENT: Head atraumatic, normocephalic. Oropharynx and nasopharynx clear.  NECK:  Supple, no jugular venous distention. No thyroid enlargement, no tenderness.  LUNGS: Normal breath sounds bilaterally, no wheezing, rales,rhonchi or crepitation. No use of accessory muscles of respiration.  CARDIOVASCULAR: S1, S2 normal. No murmurs, rubs, or gallops.  ABDOMEN: Soft, nontender, nondistended. Bowel  sounds present. No organomegaly or mass.  EXTREMITIES: No pedal edema, cyanosis, or clubbing.  Some deformity in both feet toes with lateral deviation. NEUROLOGIC: Cranial nerves II through XII are intact. Muscle strength 4/5 in all extremities. Sensation intact. Gait not checked.  PSYCHIATRIC: The patient is alert and oriented x 3.  SKIN: No obvious rash, lesion, or ulcer.   LABORATORY PANEL:   CBC Recent Labs  Lab 11/10/18 1154  WBC 9.5  HGB 10.9*  HCT 31.5*  PLT 178  MCV 89.7  MCH 31.1  MCHC 34.6  RDW 12.1  LYMPHSABS 0.8  MONOABS 0.7  EOSABS 0.1  BASOSABS 0.1   ------------------------------------------------------------------------------------------------------------------  Chemistries  Recent Labs  Lab 11/10/18 1154  NA 123*  K 3.8  CL 88*  CO2 23  GLUCOSE 124*  BUN 8  CREATININE 0.41*  CALCIUM 9.2  AST 24  ALT 13  ALKPHOS 115  BILITOT 1.0   ------------------------------------------------------------------------------------------------------------------  CrCl cannot be calculated (Unknown ideal weight.). ------------------------------------------------------------------------------------------------------------------ No results for input(s): TSH, T4TOTAL, T3FREE, THYROIDAB in the last 72 hours.  Invalid input(s): FREET3   Coagulation profile No results for input(s): INR, PROTIME in the last 168 hours. ------------------------------------------------------------------------------------------------------------------- No results for input(s): DDIMER in the last 72 hours. -------------------------------------------------------------------------------------------------------------------  Cardiac Enzymes No results for input(s): CKMB, TROPONINI, MYOGLOBIN in the last 168 hours.  Invalid input(s): CK ------------------------------------------------------------------------------------------------------------------ Invalid input(s):  POCBNP  ---------------------------------------------------------------------------------------------------------------  Urinalysis    Component Value Date/Time   COLORURINE YELLOW (A) 11/10/2018 1353   APPEARANCEUR HAZY (A) 11/10/2018 1353   APPEARANCEUR Hazy 05/26/2013 1912   LABSPEC 1.009 11/10/2018 1353   LABSPEC 1.024 05/26/2013 1912   PHURINE 7.0 11/10/2018 1353   GLUCOSEU NEGATIVE 11/10/2018 1353   GLUCOSEU Negative 05/26/2013 1912   HGBUR NEGATIVE 11/10/2018 1353   BILIRUBINUR NEGATIVE 11/10/2018 1353   BILIRUBINUR 1+ 05/26/2013 1912   KETONESUR 20 (A) 11/10/2018 1353   PROTEINUR NEGATIVE 11/10/2018 1353   NITRITE POSITIVE (A) 11/10/2018 1353   LEUKOCYTESUR NEGATIVE 11/10/2018 1353   LEUKOCYTESUR Negative 05/26/2013 1912     RADIOLOGY: Dg Chest Port 1 View  Result Date: 11/10/2018 CLINICAL DATA:  Weakness. EXAM: PORTABLE CHEST 1 VIEW COMPARISON:  CT chest dated May 10, 2015. Chest x-ray dated July 28, 2014. FINDINGS: Stable mild cardiomegaly. Normal pulmonary vascularity. Unchanged chronic pleural thickening and atelectasis at the right lung base with slight rightward deviation of the mediastinum. No focal consolidation, pleural effusion, or pneumothorax. No acute osseous abnormality. IMPRESSION: 1. No active disease. 2. Stable chronic changes at the right lung base. Electronically Signed   By: Titus Dubin M.D.   On: 11/10/2018 12:39    EKG: Orders placed or performed during the hospital encounter of 11/10/18  . ED EKG  . ED EKG    IMPRESSION AND PLAN:  *Generalized weakness-hyponatremia Check TSH. Hold on spironolactone and Lasix for now. Give IV normal saline and recheck.  *Nitrite positive urine Patient does not have any UTI symptoms. I will send for urine culture, if that comes out positive we may need to treat.  *Diabetes mellitus type 2 Continue metformin and keep on sliding scale coverage.  *Liver cirrhosis Continue lactulose, Xifaxan,  holding spironolactone.  *Gastroesophageal reflux disease Continue PPI.   All the records are reviewed and case discussed with ED provider. Management plans discussed with the patient, family and they are in agreement.  CODE STATUS: Full code    TOTAL TIME TAKING CARE OF THIS PATIENT: 45 minutes.    Vaughan Basta M.D on 11/10/2018   Between 7am to 6pm - Pager - [phone]  After 6pm go to [email or web address] - password [email or web address] [facility]  Office  [phone]  CC: Primary care physician; [facility]   Note: This dictation was prepared with Dragon dictation along with smaller phrase technology. Any transcriptional errors that result from this process are unintentional.

## 2018-11-10 NOTE — ED Notes (Signed)
This RN called and updated pts daughter

## 2018-11-10 NOTE — ED Notes (Signed)
ED TO INPATIENT HANDOFF REPORT  ED Nurse Name and Phone #: Rolla Etienne [phone]  S Name/Age/Gender [name] 63 y.o. female Room/Bed: ED24A/ED24A  Code Status   Code Status: Not on file  Home/SNF/Other Home Patient oriented to: self Is this baseline? Yes   Triage Complete: Triage complete  Chief Complaint weakness ems  Triage Note Patient presents to ED via [facility] from home, patient lives alone, due to generalized weakness since Friday. Patient reports at baseline she is ambulatory with a walker. Patient reports no recent falls. A&O x4.    Allergies Allergies  Allergen Reactions  . Vicodin [Hydrocodone-Acetaminophen] Other (See Comments) and Rash    nightmares nightmares Per patient she crawls all over the bed    Level of Care/Admitting Diagnosis ED Disposition    ED Disposition Condition [place]: [place] [[id number]]  Level of Care: Med-Surg [16]  Covid Evaluation: Asymptomatic Screening Protocol (No Symptoms)  Diagnosis: Hyponatremia [[id number]]  Admitting Physician: Vaughan Basta [[id number]]  Attending Physician: Vaughan Basta [phone]  Estimated length of stay: past midnight tomorrow  Certification:: I certify this patient will need inpatient services for at least 2 midnights  PT Class (Do Not Modify): Inpatient [101]  PT Acc Code (Do Not Modify): Private [1]       B Medical/Surgery History Past Medical History:  Diagnosis Date  . Abdominal hernia   . Allergy   . Anemia   . Anginal pain ([place])   . CHF (congestive heart failure) ([place])   . Colonic polyp   . DDD (degenerative disc disease), cervical ddd  . Depression   . Diabetes mellitus without complication ([place])   . Fibromyalgia   . Gout   . Hyperlipidemia   . Hypertension   . IBS (irritable bowel syndrome)   . Liver cirrhosis ([place])   . Polyneuropathy    Past Surgical History:  Procedure Laterality Date  . CERVICAL DISC  SURGERY    . CESAREAN SECTION  [id number]  . CHOLECYSTECTOMY  2015  . COLONOSCOPY W/ BIOPSIES AND POLYPECTOMY  06/10/2013  . ESOPHAGOGASTRODUODENOSCOPY    . ESOPHAGOGASTRODUODENOSCOPY (EGD) WITH PROPOFOL N/A 06/27/2017   Procedure: ESOPHAGOGASTRODUODENOSCOPY (EGD) WITH PROPOFOL;  Surgeon: Lollie Sails, MD;  Location: [facility] ENDOSCOPY;  Service: Endoscopy;  Laterality: N/A;  . HERNIA REPAIR  2015  . KNEE CARTILAGE SURGERY Right   . partial amputation     toe surgery  . SPINE SURGERY    . TOE AMPUTATION Right 2015   partial amputation 3rd toe  . TONSILLECTOMY  1987  . UMBILICAL HERNIA REPAIR       A IV Location/Drains/Wounds Patient Lines/Drains/Airways Status   Active Line/Drains/Airways    Name:   Placement date:   Placement time:   Site:   Days:   Peripheral IV 11/10/18 Left Forearm   11/10/18    1146    Forearm   less than 1          Intake/Output Last 24 hours No intake or output data in the 24 hours ending 11/10/18 1533  Labs/Imaging Results for orders placed or performed during the hospital encounter of 11/10/18 (from the past 48 hour(s))  CBC with Differential     Status: Abnormal   Collection Time: 11/10/18 11:54 AM  Result Value Ref Range   WBC 9.5 4.0 - 10.5 K/uL   RBC 3.51 (L) 3.87 - 5.11 MIL/uL   Hemoglobin 10.9 (L) 12.0 - 15.0 g/dL   HCT 31.5 (  L) 36.0 - 46.0 %   MCV 89.7 80.0 - 100.0 fL   MCH 31.1 26.0 - 34.0 pg   MCHC 34.6 30.0 - 36.0 g/dL   RDW 12.1 11.5 - 15.5 %   Platelets 178 150 - 400 K/uL   nRBC 0.0 0.0 - 0.2 %   Neutrophils Relative % 82 %   Neutro Abs 7.9 (H) 1.7 - 7.7 K/uL   Lymphocytes Relative 8 %   Lymphs Abs 0.8 0.7 - 4.0 K/uL   Monocytes Relative 7 %   Monocytes Absolute 0.7 0.1 - 1.0 K/uL   Eosinophils Relative 1 %   Eosinophils Absolute 0.1 0.0 - 0.5 K/uL   Basophils Relative 1 %   Basophils Absolute 0.1 0.0 - 0.1 K/uL   Immature Granulocytes 1 %   Abs Immature Granulocytes 0.05 0.00 - 0.07 K/uL    Comment: Performed at  [facility], [address] [place], [place] [address]  Comprehensive metabolic panel     Status: Abnormal   Collection Time: 11/10/18 11:54 AM  Result Value Ref Range   Sodium 123 (L) 135 - 145 mmol/L   Potassium 3.8 3.5 - 5.1 mmol/L   Chloride 88 (L) 98 - 111 mmol/L   CO2 23 22 - 32 mmol/L   Glucose, Bld 124 (H) 70 - 99 mg/dL   BUN 8 8 - 23 mg/dL   Creatinine, Ser 0.41 (L) 0.44 - 1.00 mg/dL   Calcium 9.2 8.9 - 10.3 mg/dL   Total Protein 7.3 6.5 - 8.1 g/dL   Albumin 4.3 3.5 - 5.0 g/dL   AST 24 15 - 41 U/L   ALT 13 0 - 44 U/L   Alkaline Phosphatase 115 38 - 126 U/L   Total Bilirubin 1.0 0.3 - 1.2 mg/dL   GFR calc non Af Amer >60 >60 mL/min   GFR calc Af Amer >60 >60 mL/min   Anion gap 12 5 - 15    Comment: Performed at [facility], [place], [place] [address]  Troponin I (High Sensitivity)     Status: None   Collection Time: 11/10/18 11:54 AM  Result Value Ref Range   Troponin I (High Sensitivity) 3 <18 ng/L    Comment: (NOTE) Elevated high sensitivity troponin I (hsTnI) values and significant  changes across serial measurements may suggest ACS but many other  chronic and acute conditions are known to elevate hsTnI results.  Refer to the "Links" section for chest pain algorithms and additional  guidance. Performed at [facility], [address] [place], [place] [address]   TSH     Status: Abnormal   Collection Time: 11/10/18 11:54 AM  Result Value Ref Range   TSH 4.771 (H) 0.350 - 4.500 uIU/mL    Comment: Performed by a 3rd Generation assay with a functional sensitivity of <=0.01 uIU/mL. Performed at [facility], [address] [place], [place] [address]   Urinalysis, Complete w Microscopic     Status: Abnormal   Collection Time: 11/10/18  1:53 PM  Result Value Ref Range   Color, Urine YELLOW (A) YELLOW   APPearance HAZY (A) CLEAR   Specific Gravity, Urine 1.009 1.005 - 1.030   pH 7.0 5.0 - 8.0   Glucose,  UA NEGATIVE NEGATIVE mg/dL   Hgb urine dipstick NEGATIVE NEGATIVE   Bilirubin Urine NEGATIVE NEGATIVE   Ketones, ur 20 (A) NEGATIVE mg/dL   Protein, ur NEGATIVE NEGATIVE mg/dL   Nitrite POSITIVE (A) NEGATIVE  Leukocytes,Ua NEGATIVE NEGATIVE   RBC / HPF 0-5 0 - 5 RBC/hpf   WBC, UA 0-5 0 - 5 WBC/hpf   Bacteria, UA RARE (A) NONE SEEN   Squamous Epithelial / LPF 0-5 0 - 5    Comment: Performed at [facility], [address] [place], [place] [address]  SARS Coronavirus 2 [facility] order, Performed in [facility] hospital lab) Nasopharyngeal Nasopharyngeal Swab     Status: None   Collection Time: 11/10/18  1:53 PM   Specimen: Nasopharyngeal Swab  Result Value Ref Range   SARS Coronavirus 2 NEGATIVE NEGATIVE    Comment: (NOTE) If result is NEGATIVE SARS-CoV-2 target nucleic acids are NOT DETECTED. The SARS-CoV-2 RNA is generally detectable in upper and lower  respiratory specimens during the acute phase of infection. The lowest  concentration of SARS-CoV-2 viral copies this assay can detect is 250  copies / mL. A negative result does not preclude SARS-CoV-2 infection  and should not be used as the sole basis for treatment or other  patient management decisions.  A negative result may occur with  improper specimen collection / handling, submission of specimen other  than nasopharyngeal swab, presence of viral mutation(s) within the  areas targeted by this assay, and inadequate number of viral copies  (<250 copies / mL). A negative result must be combined with clinical  observations, patient history, and epidemiological information. If result is POSITIVE SARS-CoV-2 target nucleic acids are DETECTED. The SARS-CoV-2 RNA is generally detectable in upper and lower  respiratory specimens dur ing the acute phase of infection.  Positive  results are indicative of active infection with SARS-CoV-2.  Clinical  correlation with patient history and other diagnostic information is   necessary to determine patient infection status.  Positive results do  not rule out bacterial infection or co-infection with other viruses. If result is PRESUMPTIVE POSTIVE SARS-CoV-2 nucleic acids MAY BE PRESENT.   A presumptive positive result was obtained on the submitted specimen  and confirmed on repeat testing.  While 2019 novel coronavirus  (SARS-CoV-2) nucleic acids may be present in the submitted sample  additional confirmatory testing may be necessary for epidemiological  and / or clinical management purposes  to differentiate between  SARS-CoV-2 and other Sarbecovirus currently known to infect humans.  If clinically indicated additional testing with an alternate test  methodology [phone]) is advised. The SARS-CoV-2 RNA is generally  detectable in upper and lower respiratory sp ecimens during the acute  phase of infection. The expected result is Negative. Fact Sheet for Patients:  StrictlyIdeas.no Fact Sheet for Healthcare Providers: BankingDealers.co.za This test is not yet approved or cleared by the [place] FDA and has been authorized for detection and/or diagnosis of SARS-CoV-2 by FDA under an Emergency Use Authorization (EUA).  This EUA will remain in effect (meaning this test can be used) for the duration of the COVID-19 declaration under Section 564(b)(1) of the Act, 21 U.S.C. section 360bbb-3(b)(1), unless the authorization is terminated or revoked sooner. Performed at [facility], [address], [place], [place] [address]    Dg Chest Port 1 View  Result Date: 11/10/2018 CLINICAL DATA:  Weakness. EXAM: PORTABLE CHEST 1 VIEW COMPARISON:  CT chest dated May 10, 2015. Chest x-ray dated July 28, 2014. FINDINGS: Stable mild cardiomegaly. Normal pulmonary vascularity. Unchanged chronic pleural thickening and atelectasis at the right lung base with slight rightward deviation of the mediastinum. No focal  consolidation, pleural effusion, or pneumothorax. No acute osseous abnormality. IMPRESSION: 1. No  active disease. 2. Stable chronic changes at the right lung base. Electronically Signed   By: Titus Dubin M.D.   On: 11/10/2018 12:39    Pending Labs Unresulted Labs (From admission, onward)    Start     Ordered   11/10/18 1447  Urine Culture  Add-on,   AD     11/10/18 1446   11/10/18 1445  Hemoglobin A1c  Once,   STAT    Comments: To assess prior glycemic control    11/10/18 1444   Signed and Held  HIV antibody (Routine Testing)  Once,   R     Signed and Held   Signed and Occupational hygienist morning,   R     Signed and Held   Signed and Held  CBC  Tomorrow morning,   R     Signed and Held   Signed and Held  CBC  (heparin)  Once,   R    Comments: Baseline for heparin therapy IF NOT ALREADY DRAWN.  Notify MD if PLT < 100 K.    Signed and Held   Signed and Held  Creatinine, serum  (heparin)  Once,   R    Comments: Baseline for heparin therapy IF NOT ALREADY DRAWN.    Signed and Held          Vitals/Pain Today's Vitals   11/10/18 1230 11/10/18 1300 11/10/18 1330 11/10/18 1528  BP: 137/79 (!) 144/97 (!) 144/81 128/70  Pulse: 92 (!) 106 98 98  Resp: 16 19 (!) 23 20  Temp:    98.2 F (36.8 C)  TempSrc:    Oral  SpO2: 97% 96% 96% 98%  Height:      PainSc:        Isolation Precautions No active isolations  Medications Medications  0.9 %  sodium chloride infusion (has no administration in time range)  insulin aspart (novoLOG) injection 0-9 Units (has no administration in time range)  insulin aspart (novoLOG) injection 0-5 Units (has no administration in time range)    Mobility walks with person assist Moderate fall risk   Focused Assessment    R Recommendations: See Admitting Provider Note  Report given to:  Additional Notes:

## 2018-11-10 NOTE — ED Triage Notes (Signed)
Patient presents to ED via [facility] from home, patient lives alone, due to generalized weakness since Friday. Patient reports at baseline she is ambulatory with a walker. Patient reports no recent falls. A&O x4.

## 2018-11-10 NOTE — Progress Notes (Signed)
Family Meeting Note  Advance Directive:yes  Today a meeting took place with the Patient.  The following clinical team members were present during this meeting:MD  The following were discussed:Patient's diagnosis: Liver cirrhosis, hyponatremia, diabetes, generalized weakness, Patient's progosis: Unable to determine and Goals for treatment: Full Code  Patient states she would want her sister to be her POA in any adverse event.  Additional follow-up to be provided: PMD  Time spent during discussion:20 minutes  Vaughan Basta, MD

## 2018-11-11 DIAGNOSIS — K7460 Unspecified cirrhosis of liver: Secondary | ICD-10-CM | POA: Insufficient documentation

## 2018-11-11 LAB — GLUCOSE, CAPILLARY
Glucose-Capillary: 130 mg/dL — ABNORMAL HIGH (ref 70–99)
Glucose-Capillary: 126 mg/dL — ABNORMAL HIGH (ref 70–99)
Glucose-Capillary: 128 mg/dL — ABNORMAL HIGH (ref 70–99)
Glucose-Capillary: 152 mg/dL — ABNORMAL HIGH (ref 70–99)

## 2018-11-11 LAB — BASIC METABOLIC PANEL
Anion gap: 8 (ref 5–15)
BUN: 5 mg/dL — ABNORMAL LOW (ref 8–23)
CO2: 23 mmol/L (ref 22–32)
Calcium: 8.8 mg/dL — ABNORMAL LOW (ref 8.9–10.3)
Chloride: 92 mmol/L — ABNORMAL LOW (ref 98–111)
Creatinine, Ser: 0.41 mg/dL — ABNORMAL LOW (ref 0.44–1.00)
GFR calc Af Amer: >60 mL/min (ref >60)
GFR calc non Af Amer: >60 mL/min (ref >60)
Glucose, Bld: 128 mg/dL — ABNORMAL HIGH (ref 70–99)
Potassium: 3.5 mmol/L (ref 3.5–5.1)
Sodium: 123 mmol/L — ABNORMAL LOW (ref 135–145)

## 2018-11-11 LAB — CBC
HCT: 28.7 % — ABNORMAL LOW (ref 36.0–46.0)
Hemoglobin: 9.9 g/dL — ABNORMAL LOW (ref 12.0–15.0)
MCH: 30.7 pg (ref 26.0–34.0)
MCHC: 34.5 g/dL (ref 30.0–36.0)
MCV: 88.9 fL (ref 80.0–100.0)
Platelets: 148 10*3/uL — ABNORMAL LOW (ref 150–400)
RBC: 3.23 MIL/uL — ABNORMAL LOW (ref 3.87–5.11)
RDW: 11.9 % (ref 11.5–15.5)
WBC: 9.1 10*3/uL (ref 4.0–10.5)
nRBC: 0 % (ref 0.0–0.2)

## 2018-11-11 LAB — MAGNESIUM: Magnesium: 1.8 mg/dL (ref 1.7–2.4)

## 2018-11-11 MED ORDER — SODIUM CHLORIDE 0.9 % IV SOLN
1.0000 g | INTRAVENOUS | Status: DC
  Administered 2018-11-11 – 2018-11-12 (×2): 1 g via INTRAVENOUS
  Filled 2018-11-11 (×2): qty 1

## 2018-11-11 MED ORDER — IBUPROFEN 400 MG PO TABS
400.0000 mg | ORAL_TABLET | Freq: Four times a day (QID) | ORAL | Status: DC | PRN
  Administered 2018-11-11 (×2): 400 mg via ORAL
  Filled 2018-11-11 (×2): qty 1

## 2018-11-11 NOTE — Evaluation (Signed)
Physical Therapy Evaluation Patient Details Name: [name] MRN: [id number] DOB: [birth year] Today's Date: 11/11/2018   History of Present Illness  Pt admitted for hyponatremia. Pt with history of anemia, CHF, DDD, depression, DM, HTN, and IBS.   Clinical Impression  Pt is a pleasant 63 year old female who was admitted for hyponatremia. Pt performs bed mobility independently, transfers with mod indep, and ambulation with cga and RW. Pt demonstrates deficits with endurance/strength. Is motivated to perform therapy. Would benefit from skilled PT to address above deficits and promote optimal return to PLOF. Recommend transition to [place] upon discharge from acute hospitalization.     Follow Up Recommendations Home health PT    Equipment Recommendations  None recommended by PT    Recommendations for Other Services       Precautions / Restrictions Precautions Precautions: Fall Restrictions Weight Bearing Restrictions: No      Mobility  Bed Mobility Overal bed mobility: Independent             General bed mobility comments: able to perform transfer to EOB with safe technique  Transfers Overall transfer level: Modified independent Equipment used: Rolling walker (2 wheeled)             General transfer comment: safe technique with RW. Upright posture  Ambulation/Gait Ambulation/Gait assistance: Min guard Gait Distance (Feet): 150 Feet Assistive device: Rolling walker (2 wheeled) Gait Pattern/deviations: Step-through pattern     General Gait Details: ambulated in room several laps and limited distance in hallway. Performed about 94' without AD with slight unsteadiness. Further ambulation distance performed with RW with improved balance/endurance. Fatigues with increased distance  Stairs            Wheelchair Mobility    Modified Rankin (Stroke Patients Only)       Balance Overall balance assessment: Needs assistance Sitting-balance support: Feet  supported Sitting balance-Leahy Scale: Good     Standing balance support: Bilateral upper extremity supported Standing balance-Leahy Scale: Good                               Pertinent Vitals/Pain Pain Assessment: No/denies pain    Home Living Family/patient expects to be discharged to:: Private residence Living Arrangements: Alone   Type of Home: House Home Access: Stairs to enter Entrance Stairs-Rails: None Entrance Stairs-Number of Steps: 2 Home Layout: One level Home Equipment: Walker - 2 wheels      Prior Function Level of Independence: Independent with assistive device(s)         Comments: uses RW for mobility. Able to complete all ADLs with independence     Hand Dominance        Extremity/Trunk Assessment   Upper Extremity Assessment Upper Extremity Assessment: Overall WFL for tasks assessed    Lower Extremity Assessment Lower Extremity Assessment: Generalized weakness(B LE grossly 4+/5)       Communication   Communication: No difficulties  Cognition Arousal/Alertness: Awake/alert Behavior During Therapy: WFL for tasks assessed/performed Overall Cognitive Status: Within Functional Limits for tasks assessed                                        General Comments      Exercises Other Exercises Other Exercises: ambulated to bathroom with supervision. Needs cga for set up and safety as pt slightly impulsive due to sudden urge.  CGA for hygiene   Assessment/Plan    PT Assessment Patient needs continued PT services  PT Problem List Decreased strength;Decreased activity tolerance;Decreased balance       PT Treatment Interventions Gait training;Stair training;Therapeutic exercise;Balance training    PT Goals (Current goals can be found in the Care Plan section)  Acute Rehab PT Goals Patient Stated Goal: to go home PT Goal Formulation: With patient Time For Goal Achievement: 11/25/18 Potential to Achieve Goals:  Good    Frequency Min 2X/week   Barriers to discharge        Co-evaluation               AM-PAC PT "6 Clicks" Mobility  Outcome Measure Help needed turning from your back to your side while in a flat bed without using bedrails?: None Help needed moving from lying on your back to sitting on the side of a flat bed without using bedrails?: None Help needed moving to and from a bed to a chair (including a wheelchair)?: A Little Help needed standing up from a chair using your arms (e.g., wheelchair or bedside chair)?: A Little Help needed to walk in hospital room?: A Little Help needed climbing 3-5 steps with a railing? : A Little 6 Click Score: 20    End of Session Equipment Utilized During Treatment: Gait belt Activity Tolerance: Patient tolerated treatment well Patient left: in bed(refuses to sit in recliner at this time) Nurse Communication: Mobility status PT Visit Diagnosis: Muscle weakness (generalized) (M62.81);Difficulty in walking, not elsewhere classified (R26.2)    Time: 6837-2902 PT Time Calculation (min) (ACUTE ONLY): 22 min   Charges:   PT Evaluation $PT Eval Low Complexity: 1 Low PT Treatments $Therapeutic Activity: 8-22 mins        Greggory Stallion, PT, DPT [phone]   Darden Flemister 11/11/2018, 11:30 AM

## 2018-11-11 NOTE — Progress Notes (Signed)
[place] [facility] [place] NAME: [name]    MR#:  [id number]  DATE OF BIRTH:  1956-02-18  SUBJECTIVE:  CHIEF COMPLAINT:   Chief Complaint  Patient presents with  . Weakness   The patient has generalized weakness. REVIEW OF SYSTEMS:  Review of Systems  Constitutional: Positive for malaise/fatigue. Negative for chills and fever.  HENT: Negative for sore throat.   Eyes: Negative for blurred vision and double vision.  Respiratory: Negative for cough, hemoptysis, shortness of breath, wheezing and stridor.   Cardiovascular: Negative for chest pain, palpitations, orthopnea and leg swelling.  Gastrointestinal: Negative for abdominal pain, blood in stool, diarrhea, melena, nausea and vomiting.  Genitourinary: Negative for dysuria, flank pain and hematuria.  Musculoskeletal: Negative for back pain and joint pain.  Skin: Negative for rash.  Neurological: Negative for dizziness, sensory change, focal weakness, seizures, loss of consciousness, weakness and headaches.  Endo/Heme/Allergies: Negative for polydipsia.  Psychiatric/Behavioral: Negative for depression. The patient is not nervous/anxious.     DRUG ALLERGIES:   Allergies  Allergen Reactions  . Vicodin [Hydrocodone-Acetaminophen] Other (See Comments) and Rash    nightmares nightmares Per patient she crawls all over the bed   VITALS:  Blood pressure (!) 118/57, pulse (!) 101, temperature 97.9 F (36.6 C), temperature source Oral, resp. rate 17, height 5' 5"  (1.651 m), weight 105.5 kg, SpO2 98 %. PHYSICAL EXAMINATION:  Physical Exam Constitutional:      General: She is not in acute distress.    Appearance: Normal appearance.  HENT:     Head: Normocephalic.     Mouth/Throat:     Mouth: Mucous membranes are moist.  Eyes:     General: No scleral icterus.    Conjunctiva/sclera: Conjunctivae normal.     Pupils: Pupils are equal, round, and reactive to light.  Neck:   Musculoskeletal: Normal range of motion and neck supple.     Vascular: No JVD.     Trachea: No tracheal deviation.  Cardiovascular:     Rate and Rhythm: Normal rate and regular rhythm.     Heart sounds: Normal heart sounds. No murmur. No gallop.   Pulmonary:     Effort: Pulmonary effort is normal. No respiratory distress.     Breath sounds: Normal breath sounds. No wheezing or rales.  Abdominal:     General: Bowel sounds are normal. There is no distension.     Palpations: Abdomen is soft.     Tenderness: There is no abdominal tenderness. There is no rebound.  Musculoskeletal: Normal range of motion.        General: No tenderness.     Right lower leg: Edema present.     Left lower leg: Edema present.     Comments: Trace leg edema.  Skin:    Findings: No erythema or rash.  Neurological:     General: No focal deficit present.     Mental Status: She is alert and oriented to person, place, and time.     Cranial Nerves: No cranial nerve deficit.  Psychiatric:        Mood and Affect: Mood normal.    LABORATORY PANEL:  Female CBC Recent Labs  Lab 11/11/18 0415  WBC 9.1  HGB 9.9*  HCT 28.7*  PLT 148*   ------------------------------------------------------------------------------------------------------------------ Chemistries  Recent Labs  Lab 11/10/18 1154 11/11/18 0415  NA 123* 123*  K 3.8 3.5  CL 88* 92*  CO2 23 23  GLUCOSE 124* 128*  BUN  8 5*  CREATININE 0.41* 0.41*  CALCIUM 9.2 8.8*  MG  --  1.8  AST 24  --   ALT 13  --   ALKPHOS 115  --   BILITOT 1.0  --    RADIOLOGY:  No results found. ASSESSMENT AND PLAN:    *Generalized weakness-hyponatremia Hold on spironolactone and Lasix for now.  Advised patient fluid restriction. Continue IV normal saline, sodium still low at 123.  Follow-up sodium level.  *UTI.  Nitrite positive urine Patient does not have any UTI symptoms. Continue Rocephin and follow-up urine culture.  *Diabetes mellitus type 2 Hold  metformin and continue sliding scale coverage.  *Liver cirrhosis Continue lactulose, Xifaxan, holding spironolactone.  *Gastroesophageal reflux disease Continue PPI.  Anemia of chronic disease.  Stable. PT evaluation suggest home health and PT. All the records are reviewed and case discussed with Care Management/Social Worker. Management plans discussed with the patient, her daughter and they are in agreement.  CODE STATUS: Full Code  TOTAL TIME TAKING CARE OF THIS PATIENT: 33 minutes.   More than 50% of the time was spent in counseling/coordination of care: YES  POSSIBLE D/C IN 2-3 DAYS, DEPENDING ON CLINICAL CONDITION.   Demetrios Loll M.D on 11/11/2018 at 2:59 PM  Between 7am to 6pm - Pager - [phone]  After 6pm go to [email or web address] - [facility]

## 2018-11-11 NOTE — Progress Notes (Signed)
Patient up in hall, wondering where she was, pulled IV out; assisted back to bed; Oriented to person only, reoriented to place, time and situation; placed back in bed, alarm set; instructed not to get OOB without assistance; Barbaraann Faster, RN 8:05 PM; 11/11/2018

## 2018-11-12 LAB — T4, FREE: Free T4: 1.28 ng/dL — ABNORMAL HIGH (ref 0.61–1.12)

## 2018-11-12 LAB — URINE CULTURE: Culture: >=100000 — AB

## 2018-11-12 LAB — GLUCOSE, CAPILLARY
Glucose-Capillary: 108 mg/dL — ABNORMAL HIGH (ref 70–99)
Glucose-Capillary: 132 mg/dL — ABNORMAL HIGH (ref 70–99)

## 2018-11-12 LAB — BASIC METABOLIC PANEL
Anion gap: 9 (ref 5–15)
BUN: 7 mg/dL — ABNORMAL LOW (ref 8–23)
CO2: 24 mmol/L (ref 22–32)
Calcium: 9 mg/dL (ref 8.9–10.3)
Chloride: 99 mmol/L (ref 98–111)
Creatinine, Ser: 0.47 mg/dL (ref 0.44–1.00)
GFR calc Af Amer: >60 mL/min (ref >60)
GFR calc non Af Amer: >60 mL/min (ref >60)
Glucose, Bld: 128 mg/dL — ABNORMAL HIGH (ref 70–99)
Potassium: 3.8 mmol/L (ref 3.5–5.1)
Sodium: 132 mmol/L — ABNORMAL LOW (ref 135–145)

## 2018-11-12 LAB — HIV ANTIBODY (ROUTINE TESTING W REFLEX): HIV Screen 4th Generation wRfx: NONREACTIVE

## 2018-11-12 MED ORDER — CEPHALEXIN 500 MG PO CAPS: 500.0000 mg | ORAL_CAPSULE | Freq: Four times a day (QID) | ORAL | 0 refills | Status: AC

## 2018-11-12 NOTE — Progress Notes (Signed)
Pt discharged per MD order. IV removed. Discharge instructions reviewed with pt and her daughter. Pt verbalized understanding. Prescription given to pt. Pt taken downstairs in wheelchair by staff.

## 2018-11-12 NOTE — TOC Initial Note (Signed)
Transition of Care [facility]) - Initial/Assessment Note    Patient Details  Name: [name] MRN: [id number] Date of Birth: 05-22-1955  [facility]) CM/SW Contact:    Beverly Sessions, RN Phone Number: 11/12/2018, 2:19 PM  Clinical Narrative:                 Patient to discharge home today.  RNCM met with patient and daughter at bedside.  Daughter to transport at discharge.   Patient lives at home alone.   PCP [facility].  Denies issues obtaining medications  States that daughter provides all transportation  Bedside RN concerned about mental status.  She notified MD.  Daughter states this her baseline and says "she is actually better now". Daughter states she does better when she is in her home environment. Daughter states that she lives locally and is available to provide support  Patient to discharge home with home health services. CMS Medicare.gov Compare Post Acute Care list reviewed with patient and daughter.  They both state the do not have a preference of home health agency.  Referral made to [facility] with [place].  RNCM confirmed address and contact information  RNCM signing off   Expected Discharge Plan: [place] Services Barriers to Discharge: Barriers Resolved   Patient Goals and CMS Choice   CMS Medicare.gov Compare Post Acute Care list provided to:: Patient Choice offered to / list presented to : Adult Children  Expected Discharge Plan and Services Expected Discharge Plan: [place]   Discharge Planning Services: CM Consult Post Acute Care Choice: [place] arrangements for the past 2 months: Single Family Home Expected Discharge Date: 11/12/18                         HH Arranged: RN, PT [place] Agency: [place] Date [place]: 11/12/18 Time [place]: 1245 Representative spoke with at [place]: [place] Arrangements/Services Living arrangements for the past 2 months: [place] Lives with:: Self Patient language and need for interpreter reviewed:: Yes Do you feel safe going back to the place where you live?: Yes      Need for Family Participation in Patient Care: Yes (Comment) Care giver support system in place?: Yes (comment) Current home services: DME Criminal Activity/Legal Involvement Pertinent to Current Situation/Hospitalization: No - Comment as needed  Activities of Daily Living Home Assistive Devices/Equipment: Walker (specify type) ADL Screening (condition at time of admission) Patient's cognitive ability adequate to safely complete daily activities?: Yes Is the patient deaf or have difficulty hearing?: No Does the patient have difficulty seeing, even when wearing glasses/contacts?: No Does the patient have difficulty concentrating, remembering, or making decisions?: Yes Patient able to express need for assistance with ADLs?: Yes Does the patient have difficulty dressing or bathing?: No Independently performs ADLs?: Yes (appropriate for developmental age) Does the patient have difficulty walking or climbing stairs?: Yes Weakness of Legs: Both Weakness of Arms/Hands: None  Permission Sought/Granted                  Emotional Assessment Appearance:: Appears stated age     Orientation: : Oriented to Self, Oriented to Situation Alcohol / Substance Use: Not Applicable    Admission diagnosis:  Hyponatremia [E87.1] Generalized weakness [R53.1] Patient Active Problem List   Diagnosis Date Noted  . Liver cirrhosis ([place])   .  Hyponatremia 11/10/2018  . Osteoarthritis of knee (Right) 07/14/2017  . Tricompartment osteoarthritis of knee (Right) 07/14/2017  . Chronic knee pain (Primary Area of Pain) (Right) 06/09/2017  . Chronic low back pain (Secondary Area of Pain) (Bilateral) (R>L) 06/09/2017  . Chronic neck pain (Tertiary Area of Pain) 06/09/2017  .  Chronic shoulder pain (Fourth Area of Pain) (Bilateral) (R>L) 06/09/2017  . Chronic pain syndrome 06/09/2017  . Pharmacologic therapy 06/09/2017  . Disorder of skeletal system 06/09/2017  . Problems influencing health status 06/09/2017  . DJD (degenerative joint disease) of knee 09/21/2015  . Diabetes mellitus ([place]) 09/21/2015  . Fibromyalgia 09/21/2015  . Cirrhosis of liver ([place]) 09/21/2015  . Hepatic cirrhosis ([place]) 11/18/2014  . Polyp of colon 11/18/2014   PCP:  [place], [place]:   [id number] [address], [address] [place] [address] Phone: [phone] Fax: [place], [place] - [address] [place] [address] Phone: [phone] Fax: [phone]     Social Determinants of Health (SDOH) Interventions    Readmission Risk Interventions No flowsheet data found.

## 2018-11-12 NOTE — Discharge Summary (Signed)
[name] at [place] NAME: [name]    MR#:  [id number]  DATE OF BIRTH:  [birth year]  DATE OF ADMISSION:  11/10/2018   ADMITTING PHYSICIAN: Vaughan Basta, MD  DATE OF DISCHARGE: 11/12/2018  2:15 PM  PRIMARY CARE PHYSICIAN: [place], [place]   ADMISSION DIAGNOSIS:  Hyponatremia [E87.1] Generalized weakness [R53.1] DISCHARGE DIAGNOSIS:  Principal Problem:   Hyponatremia  SECONDARY DIAGNOSIS:   Past Medical History:  Diagnosis Date  . Abdominal hernia   . Allergy   . Anemia   . Anginal pain ([place])   . CHF (congestive heart failure) ([place])   . Colonic polyp   . DDD (degenerative disc disease), cervical ddd  . Depression   . Diabetes mellitus without complication ([place])   . Fibromyalgia   . Gout   . Hyperlipidemia   . Hypertension   . IBS (irritable bowel syndrome)   . Liver cirrhosis ([place])   . Polyneuropathy    HOSPITAL COURSE:   *Generalized weakness-hyponatremia Hold on spironolactone and Lasix for now.  Advised patient fluid restriction. Improving with IV normal saline.  Follow-up sodium level with PCP.  *UTI.  Nitrite positive urine Patient does not have any UTI symptoms. The patient is treated with Rocephin and follow-up urine culture: E. coli sensitive to multiple antibiotics.  Change to Keflex p.o.  *Diabetes mellitus type 2 Hold metformin and continue sliding scale coverage.  Resume metformin after discharge.  *Liver cirrhosis Continue lactulose, Xifaxan, resume spironolactone.  Hold Lasix.  Follow-up PCP to resume.  *Gastroesophageal reflux disease Continue PPI.  Anemia of chronic disease.  Stable. PT evaluation suggest home health and PT. Generalized weakness.  PT evaluation suggest home health and PT. DISCHARGE CONDITIONS:  Stable, discharged to home with home health today. CONSULTS OBTAINED:   DRUG ALLERGIES:   Allergies  Allergen Reactions  . Vicodin  [Hydrocodone-Acetaminophen] Other (See Comments) and Rash    nightmares nightmares Per patient she crawls all over the bed   DISCHARGE MEDICATIONS:   Allergies as of 11/12/2018      Reactions   Vicodin [hydrocodone-acetaminophen] Other (See Comments), Rash   nightmares nightmares Per patient she crawls all over the bed      Medication List    STOP taking these medications   furosemide 20 MG tablet Commonly known as: LASIX     TAKE these medications   acetaminophen 500 MG tablet Commonly known as: TYLENOL Take 500 mg by mouth 3 (three) times daily.   buPROPion 300 MG 24 hr tablet Commonly known as: WELLBUTRIN XL Take 300 mg by mouth daily.   cephALEXin 500 MG capsule Commonly known as: KEFLEX Take 1 capsule (500 mg total) by mouth 4 (four) times daily for 3 days.   citalopram 40 MG tablet Commonly known as: CELEXA Take 40 mg by mouth daily.   lactulose 10 GM/15ML solution Commonly known as: CHRONULAC Take 30 g by mouth 3 (three) times daily.   metFORMIN 500 MG tablet Commonly known as: GLUCOPHAGE Take 500 mg by mouth daily.   methocarbamol 750 MG tablet Commonly known as: ROBAXIN Take 750 mg by mouth 3 (three) times daily.   pantoprazole 40 MG tablet Commonly known as: PROTONIX Take 40 mg by mouth daily.   risperiDONE 2 MG tablet Commonly known as: RISPERDAL Take 2 mg by mouth 2 (two) times daily.   spironolactone 50 MG tablet Commonly known as: ALDACTONE Take 50 mg by mouth daily.   Xifaxan 550 MG  Tabs tablet Generic drug: rifaximin 550 mg 2 (two) times daily.        DISCHARGE INSTRUCTIONS:  See AVS. If you experience worsening of your admission symptoms, develop shortness of breath, life threatening emergency, suicidal or homicidal thoughts you must seek medical attention immediately by calling 911 or calling your MD immediately  if symptoms less severe.  You Must read complete instructions/literature along with all the possible adverse  reactions/side effects for all the Medicines you take and that have been prescribed to you. Take any new Medicines after you have completely understood and accpet all the possible adverse reactions/side effects.   Please note  You were cared for by a hospitalist during your hospital stay. If you have any questions about your discharge medications or the care you received while you were in the hospital after you are discharged, you can call the unit and asked to speak with the hospitalist on call if the hospitalist that took care of you is not available. Once you are discharged, your primary care physician will handle any further medical issues. Please note that NO REFILLS for any discharge medications will be authorized once you are discharged, as it is imperative that you return to your primary care physician (or establish a relationship with a primary care physician if you do not have one) for your aftercare needs so that they can reassess your need for medications and monitor your lab values.    On the day of Discharge:  VITAL SIGNS:  Blood pressure 111/78, pulse (!) 113, temperature (!) 97.4 F (36.3 C), temperature source Oral, resp. rate 20, height 5' 5"  (1.651 m), weight 105.5 kg, SpO2 97 %. PHYSICAL EXAMINATION:  GENERAL:  63 y.o.-year-old patient lying in the bed with no acute distress.  EYES: Pupils equal, round, reactive to light and accommodation. No scleral icterus. Extraocular muscles intact.  HEENT: Head atraumatic, normocephalic. Oropharynx and nasopharynx clear.  NECK:  Supple, no jugular venous distention. No thyroid enlargement, no tenderness.  LUNGS: Normal breath sounds bilaterally, no wheezing, rales,rhonchi or crepitation. No use of accessory muscles of respiration.  CARDIOVASCULAR: S1, S2 normal. No murmurs, rubs, or gallops.  ABDOMEN: Soft, non-tender, non-distended. Bowel sounds present. No organomegaly or mass.  EXTREMITIES: No pedal edema, cyanosis, or clubbing.   NEUROLOGIC: Cranial nerves II through XII are intact. Muscle strength 4/5 in all extremities. Sensation intact. Gait not checked.  PSYCHIATRIC: The patient is alert and oriented x 3.  SKIN: No obvious rash, lesion, or ulcer.  DATA REVIEW:   CBC Recent Labs  Lab 11/11/18 0415  WBC 9.1  HGB 9.9*  HCT 28.7*  PLT 148*    Chemistries  Recent Labs  Lab 11/10/18 1154 11/11/18 0415 11/12/18 0339  NA 123* 123* 132*  K 3.8 3.5 3.8  CL 88* 92* 99  CO2 23 23 24   GLUCOSE 124* 128* 128*  BUN 8 5* 7*  CREATININE 0.41* 0.41* 0.47  CALCIUM 9.2 8.8* 9.0  MG  --  1.8  --   AST 24  --   --   ALT 13  --   --   ALKPHOS 115  --   --   BILITOT 1.0  --   --      Microbiology Results  Results for orders placed or performed during the hospital encounter of 11/10/18  SARS Coronavirus 2 [facility] order, Performed in [facility] hospital lab) Nasopharyngeal Nasopharyngeal Swab     Status: None   Collection Time: 11/10/18  1:53 PM  Specimen: Nasopharyngeal Swab  Result Value Ref Range Status   SARS Coronavirus 2 NEGATIVE NEGATIVE Final    Comment: (NOTE) If result is NEGATIVE SARS-CoV-2 target nucleic acids are NOT DETECTED. The SARS-CoV-2 RNA is generally detectable in upper and lower  respiratory specimens during the acute phase of infection. The lowest  concentration of SARS-CoV-2 viral copies this assay can detect is 250  copies / mL. A negative result does not preclude SARS-CoV-2 infection  and should not be used as the sole basis for treatment or other  patient management decisions.  A negative result may occur with  improper specimen collection / handling, submission of specimen other  than nasopharyngeal swab, presence of viral mutation(s) within the  areas targeted by this assay, and inadequate number of viral copies  (<250 copies / mL). A negative result must be combined with clinical  observations, patient history, and epidemiological information. If result is  POSITIVE SARS-CoV-2 target nucleic acids are DETECTED. The SARS-CoV-2 RNA is generally detectable in upper and lower  respiratory specimens dur ing the acute phase of infection.  Positive  results are indicative of active infection with SARS-CoV-2.  Clinical  correlation with patient history and other diagnostic information is  necessary to determine patient infection status.  Positive results do  not rule out bacterial infection or co-infection with other viruses. If result is PRESUMPTIVE POSTIVE SARS-CoV-2 nucleic acids MAY BE PRESENT.   A presumptive positive result was obtained on the submitted specimen  and confirmed on repeat testing.  While 2019 novel coronavirus  (SARS-CoV-2) nucleic acids may be present in the submitted sample  additional confirmatory testing may be necessary for epidemiological  and / or clinical management purposes  to differentiate between  SARS-CoV-2 and other Sarbecovirus currently known to infect humans.  If clinically indicated additional testing with an alternate test  methodology [phone]) is advised. The SARS-CoV-2 RNA is generally  detectable in upper and lower respiratory sp ecimens during the acute  phase of infection. The expected result is Negative. Fact Sheet for Patients:  StrictlyIdeas.no Fact Sheet for Healthcare Providers: BankingDealers.co.za This test is not yet approved or cleared by the [place] FDA and has been authorized for detection and/or diagnosis of SARS-CoV-2 by FDA under an Emergency Use Authorization (EUA).  This EUA will remain in effect (meaning this test can be used) for the duration of the COVID-19 declaration under Section 564(b)(1) of the Act, 21 U.S.C. section 360bbb-3(b)(1), unless the authorization is terminated or revoked sooner. Performed at [facility], [address], [place], [place] [address]   Urine Culture     Status: Abnormal   Collection  Time: 11/10/18  1:53 PM   Specimen: Urine, Random  Result Value Ref Range Status   Specimen Description   Final    URINE, RANDOM Performed at [facility], [address] [place], [place] [address]    Special Requests   Final    NONE Performed at [facility], [address] [place],  [address]    Culture >=100,000 COLONIES/mL ESCHERICHIA COLI (A)  Final   Report Status 11/12/2018 FINAL  Final   Organism ID, Bacteria ESCHERICHIA COLI (A)  Final      Susceptibility   Escherichia coli - MIC*    AMPICILLIN 4 SENSITIVE Sensitive     CEFAZOLIN <=4 SENSITIVE Sensitive     CEFTRIAXONE <=1 SENSITIVE Sensitive     CIPROFLOXACIN <=0.25 SENSITIVE Sensitive     GENTAMICIN 2 SENSITIVE Sensitive  IMIPENEM <=0.25 SENSITIVE Sensitive     NITROFURANTOIN <=16 SENSITIVE Sensitive     TRIMETH/SULFA <=20 SENSITIVE Sensitive     AMPICILLIN/SULBACTAM <=2 SENSITIVE Sensitive     PIP/TAZO <=4 SENSITIVE Sensitive     Extended ESBL NEGATIVE Sensitive     * >=100,000 COLONIES/mL ESCHERICHIA COLI    RADIOLOGY:  No results found.   Management plans discussed with the patient, family and they are in agreement.  CODE STATUS: Full Code   TOTAL TIME TAKING CARE OF THIS PATIENT: 32 minutes.    Demetrios Loll M.D on 11/12/2018 at 9:41 AM  Between 7am to 6pm - Pager - [phone]  After 6pm go to [email or web address] - Technical brewer [place] [facility]  Office  [phone]  CC: Primary care physician; [facility], [facility]   Note: This dictation was prepared with Dragon dictation along with smaller phrase technology. Any transcriptional errors that result from this process are unintentional.

## 2018-11-12 NOTE — Progress Notes (Signed)
Patient continues to think she's at home; getting OOB to [facility] multiple times; encouraged to try to sleep; bed alarm activated. Barbaraann Faster, RN 1:55 AM; 11/12/2018

## 2018-11-12 NOTE — Discharge Instructions (Signed)
HHPT

## 2018-11-16 ENCOUNTER — Other Ambulatory Visit: Payer: Medicare Other

## 2018-11-18 ENCOUNTER — Encounter: Payer: Self-pay | Admitting: *Deleted

## 2018-11-19 ENCOUNTER — Encounter: Admission: RE | Payer: Self-pay | Source: Home / Self Care

## 2018-11-19 ENCOUNTER — Ambulatory Visit: Admission: RE | Admit: 2018-11-19 | Payer: Medicare Other | Source: Home / Self Care | Admitting: Gastroenterology

## 2018-11-19 SURGERY — ESOPHAGOGASTRODUODENOSCOPY (EGD) WITH PROPOFOL
Anesthesia: General

## 2019-07-10 ENCOUNTER — Emergency Department: Payer: Medicare Other

## 2019-07-10 ENCOUNTER — Inpatient Hospital Stay
Admission: EM | Admit: 2019-07-10 | Discharge: 2019-07-15 | DRG: 640 | Disposition: A | Discharge status: Skilled Nursing Facility | Payer: Medicare Other | Attending: Hospitalist | Admitting: Hospitalist

## 2019-07-10 ENCOUNTER — Other Ambulatory Visit: Payer: Self-pay

## 2019-07-10 DIAGNOSIS — Z89421 Acquired absence of other right toe(s): Secondary | ICD-10-CM

## 2019-07-10 DIAGNOSIS — Y92009 Unspecified place in unspecified non-institutional (private) residence as the place of occurrence of the external cause: Secondary | ICD-10-CM

## 2019-07-10 DIAGNOSIS — M797 Fibromyalgia: Secondary | ICD-10-CM | POA: Diagnosis present

## 2019-07-10 DIAGNOSIS — E860 Dehydration: Secondary | ICD-10-CM | POA: Diagnosis present

## 2019-07-10 DIAGNOSIS — S5012XA Contusion of left forearm, initial encounter: Secondary | ICD-10-CM | POA: Diagnosis present

## 2019-07-10 DIAGNOSIS — Z98891 History of uterine scar from previous surgery: Secondary | ICD-10-CM

## 2019-07-10 DIAGNOSIS — Z885 Allergy status to narcotic agent status: Secondary | ICD-10-CM

## 2019-07-10 DIAGNOSIS — E119 Type 2 diabetes mellitus without complications: Secondary | ICD-10-CM | POA: Diagnosis not present

## 2019-07-10 DIAGNOSIS — Z9049 Acquired absence of other specified parts of digestive tract: Secondary | ICD-10-CM

## 2019-07-10 DIAGNOSIS — Z833 Family history of diabetes mellitus: Secondary | ICD-10-CM

## 2019-07-10 DIAGNOSIS — L97529 Non-pressure chronic ulcer of other part of left foot with unspecified severity: Secondary | ICD-10-CM | POA: Diagnosis present

## 2019-07-10 DIAGNOSIS — Z791 Long term (current) use of non-steroidal anti-inflammatories (NSAID): Secondary | ICD-10-CM

## 2019-07-10 DIAGNOSIS — G894 Chronic pain syndrome: Secondary | ICD-10-CM | POA: Diagnosis present

## 2019-07-10 DIAGNOSIS — L97519 Non-pressure chronic ulcer of other part of right foot with unspecified severity: Secondary | ICD-10-CM | POA: Diagnosis present

## 2019-07-10 DIAGNOSIS — E11621 Type 2 diabetes mellitus with foot ulcer: Secondary | ICD-10-CM | POA: Diagnosis present

## 2019-07-10 DIAGNOSIS — M1711 Unilateral primary osteoarthritis, right knee: Secondary | ICD-10-CM | POA: Diagnosis present

## 2019-07-10 DIAGNOSIS — Z86010 Personal history of colonic polyps: Secondary | ICD-10-CM

## 2019-07-10 DIAGNOSIS — I10 Essential (primary) hypertension: Secondary | ICD-10-CM | POA: Diagnosis not present

## 2019-07-10 DIAGNOSIS — Z87440 Personal history of urinary (tract) infections: Secondary | ICD-10-CM

## 2019-07-10 DIAGNOSIS — E876 Hypokalemia: Secondary | ICD-10-CM | POA: Diagnosis present

## 2019-07-10 DIAGNOSIS — Z7984 Long term (current) use of oral hypoglycemic drugs: Secondary | ICD-10-CM

## 2019-07-10 DIAGNOSIS — E1142 Type 2 diabetes mellitus with diabetic polyneuropathy: Secondary | ICD-10-CM | POA: Diagnosis present

## 2019-07-10 DIAGNOSIS — M545 Low back pain: Secondary | ICD-10-CM | POA: Diagnosis present

## 2019-07-10 DIAGNOSIS — M25511 Pain in right shoulder: Secondary | ICD-10-CM | POA: Diagnosis present

## 2019-07-10 DIAGNOSIS — I5032 Chronic diastolic (congestive) heart failure: Secondary | ICD-10-CM | POA: Diagnosis present

## 2019-07-10 DIAGNOSIS — M25512 Pain in left shoulder: Secondary | ICD-10-CM | POA: Diagnosis present

## 2019-07-10 DIAGNOSIS — Z8249 Family history of ischemic heart disease and other diseases of the circulatory system: Secondary | ICD-10-CM

## 2019-07-10 DIAGNOSIS — T502X5A Adverse effect of carbonic-anhydrase inhibitors, benzothiadiazides and other diuretics, initial encounter: Secondary | ICD-10-CM | POA: Diagnosis present

## 2019-07-10 DIAGNOSIS — K7460 Unspecified cirrhosis of liver: Secondary | ICD-10-CM | POA: Diagnosis present

## 2019-07-10 DIAGNOSIS — Z79899 Other long term (current) drug therapy: Secondary | ICD-10-CM

## 2019-07-10 DIAGNOSIS — G9341 Metabolic encephalopathy: Secondary | ICD-10-CM | POA: Diagnosis present

## 2019-07-10 DIAGNOSIS — F32A Depression, unspecified: Secondary | ICD-10-CM | POA: Insufficient documentation

## 2019-07-10 DIAGNOSIS — I4891 Unspecified atrial fibrillation: Secondary | ICD-10-CM | POA: Diagnosis not present

## 2019-07-10 DIAGNOSIS — L97428 Non-pressure chronic ulcer of left heel and midfoot with other specified severity: Secondary | ICD-10-CM | POA: Diagnosis present

## 2019-07-10 DIAGNOSIS — Z9114 Patient's other noncompliance with medication regimen: Secondary | ICD-10-CM

## 2019-07-10 DIAGNOSIS — W1830XA Fall on same level, unspecified, initial encounter: Secondary | ICD-10-CM | POA: Diagnosis present

## 2019-07-10 DIAGNOSIS — Z20822 Contact with and (suspected) exposure to covid-19: Secondary | ICD-10-CM | POA: Diagnosis present

## 2019-07-10 DIAGNOSIS — R4182 Altered mental status, unspecified: Secondary | ICD-10-CM | POA: Diagnosis not present

## 2019-07-10 DIAGNOSIS — Z818 Family history of other mental and behavioral disorders: Secondary | ICD-10-CM

## 2019-07-10 DIAGNOSIS — F329 Major depressive disorder, single episode, unspecified: Secondary | ICD-10-CM | POA: Diagnosis present

## 2019-07-10 DIAGNOSIS — Z811 Family history of alcohol abuse and dependence: Secondary | ICD-10-CM

## 2019-07-10 DIAGNOSIS — E871 Hypo-osmolality and hyponatremia: Principal | ICD-10-CM | POA: Diagnosis present

## 2019-07-10 DIAGNOSIS — G9340 Encephalopathy, unspecified: Secondary | ICD-10-CM

## 2019-07-10 DIAGNOSIS — Z8371 Family history of colonic polyps: Secondary | ICD-10-CM

## 2019-07-10 DIAGNOSIS — Z9089 Acquired absence of other organs: Secondary | ICD-10-CM

## 2019-07-10 DIAGNOSIS — Z8261 Family history of arthritis: Secondary | ICD-10-CM

## 2019-07-10 DIAGNOSIS — Z8679 Personal history of other diseases of the circulatory system: Secondary | ICD-10-CM

## 2019-07-10 LAB — COMPREHENSIVE METABOLIC PANEL
ALT: 26 U/L (ref 0–44)
AST: 54 U/L — ABNORMAL HIGH (ref 15–41)
Albumin: 3.2 g/dL — ABNORMAL LOW (ref 3.5–5.0)
Alkaline Phosphatase: 133 U/L — ABNORMAL HIGH (ref 38–126)
Anion gap: 12 (ref 5–15)
BUN: <5 mg/dL — ABNORMAL LOW (ref 8–23)
CO2: 29 mmol/L (ref 22–32)
Calcium: 8.3 mg/dL — ABNORMAL LOW (ref 8.9–10.3)
Chloride: 73 mmol/L — ABNORMAL LOW (ref 98–111)
Creatinine, Ser: 0.34 mg/dL — ABNORMAL LOW (ref 0.44–1.00)
GFR calc Af Amer: >60 mL/min (ref >60)
GFR calc non Af Amer: >60 mL/min (ref >60)
Glucose, Bld: 137 mg/dL — ABNORMAL HIGH (ref 70–99)
Potassium: 2.6 mmol/L — CL (ref 3.5–5.1)
Sodium: 114 mmol/L — CL (ref 135–145)
Total Bilirubin: 2.8 mg/dL — ABNORMAL HIGH (ref 0.3–1.2)
Total Protein: 6 g/dL — ABNORMAL LOW (ref 6.5–8.1)

## 2019-07-10 LAB — CBC WITH DIFFERENTIAL/PLATELET
Abs Immature Granulocytes: 0.03 10*3/uL (ref 0.00–0.07)
Basophils Absolute: 0 10*3/uL (ref 0.0–0.1)
Basophils Relative: 0 %
Eosinophils Absolute: 0 10*3/uL (ref 0.0–0.5)
Eosinophils Relative: 0 %
Hemoglobin: 11.1 g/dL — ABNORMAL LOW (ref 12.0–15.0)
Immature Granulocytes: 0 %
Lymphocytes Relative: 8 %
Lymphs Abs: 0.7 10*3/uL (ref 0.7–4.0)
Monocytes Absolute: 0.9 10*3/uL (ref 0.1–1.0)
Monocytes Relative: 11 %
Neutro Abs: 7 10*3/uL (ref 1.7–7.7)
Neutrophils Relative %: 81 %
Platelets: 217 10*3/uL (ref 150–400)
WBC: 8.7 10*3/uL (ref 4.0–10.5)
nRBC: 0 % (ref 0.0–0.2)

## 2019-07-10 LAB — URINALYSIS, COMPLETE (UACMP) WITH MICROSCOPIC
Bilirubin Urine: NEGATIVE
Glucose, UA: NEGATIVE mg/dL
Hgb urine dipstick: NEGATIVE
Ketones, ur: 5 mg/dL — AB
Nitrite: NEGATIVE
Protein, ur: NEGATIVE mg/dL
Specific Gravity, Urine: 1.002 — ABNORMAL LOW (ref 1.005–1.030)
pH: 6 (ref 5.0–8.0)

## 2019-07-10 LAB — PROTIME-INR
INR: 1.1 (ref 0.8–1.2)
Prothrombin Time: 13.6 seconds (ref 11.4–15.2)

## 2019-07-10 LAB — BRAIN NATRIURETIC PEPTIDE: B Natriuretic Peptide: 114 pg/mL — ABNORMAL HIGH (ref 0.0–100.0)

## 2019-07-10 LAB — SODIUM, URINE, RANDOM: Sodium, Ur: <10 mmol/L

## 2019-07-10 LAB — MAGNESIUM: Magnesium: 1.7 mg/dL (ref 1.7–2.4)

## 2019-07-10 LAB — AMMONIA: Ammonia: 28 umol/L (ref 9–35)

## 2019-07-10 LAB — POC SARS CORONAVIRUS 2 AG: SARS Coronavirus 2 Ag: NEGATIVE

## 2019-07-10 LAB — SODIUM
Sodium: 114 mmol/L — CL (ref 135–145)
Sodium: 124 mmol/L — ABNORMAL LOW (ref 135–145)

## 2019-07-10 LAB — SARS CORONAVIRUS 2 (TAT 6-24 HRS): SARS Coronavirus 2: NEGATIVE

## 2019-07-10 LAB — OSMOLALITY: Osmolality: 236 mOsm/kg — CL (ref 275–295)

## 2019-07-10 MED ORDER — SODIUM CHLORIDE 0.9 % IV BOLUS
500.0000 mL | Freq: Once | INTRAVENOUS | Status: AC
  Administered 2019-07-10: 12:00:00 500 mL via INTRAVENOUS

## 2019-07-10 MED ORDER — POTASSIUM CHLORIDE CRYS ER 20 MEQ PO TBCR
40.0000 meq | EXTENDED_RELEASE_TABLET | Freq: Once | ORAL | Status: AC
  Administered 2019-07-10: 40 meq via ORAL
  Filled 2019-07-10: qty 2

## 2019-07-10 MED ORDER — SODIUM CHLORIDE 3 % IV SOLN
INTRAVENOUS | Status: DC
  Administered 2019-07-10: 50 mL/h via INTRAVENOUS
  Filled 2019-07-10 (×3): qty 500

## 2019-07-10 MED ORDER — ONDANSETRON HCL 4 MG/2ML IJ SOLN: 4.0000 mg | Freq: Three times a day (TID) | INTRAMUSCULAR | Status: DC | PRN

## 2019-07-10 MED ORDER — POTASSIUM CHLORIDE 10 MEQ/100ML IV SOLN
10.0000 meq | INTRAVENOUS | Status: AC
  Administered 2019-07-10 (×3): 10 meq via INTRAVENOUS
  Filled 2019-07-10 (×2): qty 100

## 2019-07-10 NOTE — Consult Note (Signed)
[address] [place], [place] [address] Phone [phone]. Fax [phone]  Date: 07/10/2019                  Patient Name:  [name]  MRN: [id number]  DOB: June 11, 1955  Age / Sex: 64 y.o., female         PCP: [facility]                 Service Requesting Consult: IM/ Ivor Costa, MD                 Reason for Consult: Hyponatremia            History of Present Illness: Patient is a 64 y.o. female with multiple medical problems, who was admitted to [facility] on 07/10/2019 for evaluation of confusion. Patient not able to provide meaningful information. Per ER notes, Patient's family called EMS due to her being unable to care for herself.  She lives alone.  She was found on the floor by EMS. Found to have critically low sodium at 114 Last known Na 132 in 10/2018 Patient states she was told to come to ER. States she didn't even know she was sick.   Medications: Outpatient medications: (Not in a hospital admission)   Current medications: Current Facility-Administered Medications  Medication Dose Route Frequency Provider Last Rate Last Admin  . ondansetron (ZOFRAN) injection 4 mg  4 mg Intravenous Q8H PRN Ivor Costa, MD      . sodium chloride (hypertonic) 3 % solution   Intravenous Continuous Merlyn Lot, MD 50 mL/hr at 07/10/19 1250 50 mL/hr at 07/10/19 1250   Current Outpatient Medications  Medication Sig Dispense Refill  . acetaminophen (TYLENOL) 500 MG tablet Take 500 mg by mouth 3 (three) times daily.     . cholecalciferol (VITAMIN D3) 10 MCG (400 UNIT) TABS tablet Take 400 Units by mouth daily.    Marland Kitchen lactulose (CHRONULAC) 10 GM/15ML solution Take 30 g by mouth 3 (three) times daily.     . meloxicam (MOBIC) 15 MG tablet Take 15 mg by mouth daily.    . methocarbamol (ROBAXIN) 750 MG tablet Take 750 mg by mouth 3 (three) times daily.        Allergies: Allergies  Allergen Reactions  . Vicodin [Hydrocodone-Acetaminophen] Other (See  Comments) and Rash    nightmares nightmares Per patient she crawls all over the bed      Past Medical History: Past Medical History:  Diagnosis Date  . Abdominal hernia   . Allergy   . Anemia   . Anginal pain ([place])   . CHF (congestive heart failure) ([place])   . Colonic polyp   . DDD (degenerative disc disease), cervical ddd  . Depression   . Diabetes mellitus without complication ([place])   . Fibromyalgia   . Gout   . Hyperlipidemia   . Hypertension   . IBS (irritable bowel syndrome)   . Liver cirrhosis ([place])   . Polyneuropathy      Past Surgical History: Past Surgical History:  Procedure Laterality Date  . CERVICAL DISC SURGERY    . CESAREAN SECTION  [id number]  . CHOLECYSTECTOMY  2015  . COLONOSCOPY W/ BIOPSIES AND POLYPECTOMY  06/10/2013  . ESOPHAGOGASTRODUODENOSCOPY    . ESOPHAGOGASTRODUODENOSCOPY (EGD) WITH PROPOFOL N/A 06/27/2017   Procedure: ESOPHAGOGASTRODUODENOSCOPY (EGD) WITH PROPOFOL;  Surgeon: Lollie Sails, MD;  Location: [facility] ENDOSCOPY;  Service: Endoscopy;  Laterality: N/A;  . HERNIA REPAIR  2015  . KNEE CARTILAGE  SURGERY Right   . partial amputation     toe surgery  . SPINE SURGERY    . TOE AMPUTATION Right 2015   partial amputation 3rd toe  . TONSILLECTOMY  1987  . UMBILICAL HERNIA REPAIR       Family History: Family History  Problem Relation Age of Onset  . Alcohol abuse Mother   . Arthritis Mother   . Depression Mother   . Diabetes Mother   . Hypertension Mother   . Mental illness Mother   . Alcohol abuse Father   . Depression Father   . Mental illness Father   . Colon polyps Sister   . Heart disease Maternal Aunt      Social History: Social History   Socioeconomic History  . Marital status: Single    Spouse name: Not on file  . Number of children: Not on file  . Years of education: Not on file  . Highest education level: Not on file  Occupational History  . Not on file  Tobacco Use  . Smoking status: Never Smoker  .  Smokeless tobacco: Never Used  Substance and Sexual Activity  . Alcohol use: No  . Drug use: No  . Sexual activity: Not on file  Other Topics Concern  . Not on file  Social History Narrative   Patient lives alone and presents today with daughter   Social Determinants of Health   Financial Resource Strain:   . Difficulty of Paying Living Expenses:   Food Insecurity:   . Worried About Charity fundraiser in the Last Year:   . Arboriculturist in the Last Year:   Transportation Needs:   . Film/video editor (Medical):   Marland Kitchen Lack of Transportation (Non-Medical):   Physical Activity:   . Days of Exercise per Week:   . Minutes of Exercise per Session:   Stress:   . Feeling of Stress :   Social Connections:   . Frequency of Communication with Friends and Family:   . Frequency of Social Gatherings with Friends and Family:   . Attends Religious Services:   . Active Member of Clubs or Organizations:   . Attends Archivist Meetings:   Marland Kitchen Marital Status:   Intimate Partner Violence:   . Fear of Current or Ex-Partner:   . Emotionally Abused:   Marland Kitchen Physically Abused:   . Sexually Abused:      Review of Systems: not reliable Gen:  HEENT:  CV:  Resp:  GI: GU :  MS:  Derm:    Psych: Heme:  Neuro:  Endocrine  Vital Signs: Blood pressure (!) 117/56, pulse 86, temperature 97.6 F (36.4 C), temperature source Oral, resp. rate 18, height 5' 5"  (1.651 m), weight 105.5 kg, SpO2 99 %.   Intake/Output Summary (Last 24 hours) at 07/10/2019 1706 Last data filed at 07/10/2019 1420 Gross per 24 hour  Intake 692.59 ml  Output --  Net 692.59 ml    Weight trends: [employer]   07/10/19 1308  Weight: 105.5 kg    Physical Exam: General:  NAD, in bed in the emergency room  HEENT  dry mouth  Neck:  Supple, no masses  Lungs:  Clear to auscultation  Heart::  Irregular rhythm  Abdomen:  Soft, nontender  Extremities:  no edema  Neurologic: Alert, confused  Skin:  Bruise on left forearm    Lab results: Basic Metabolic Panel: Recent Labs  Lab 07/10/19 1034  NA 114*  114*  K 2.6*  CL 73*  CO2 29  GLUCOSE 137*  BUN <5*  CREATININE 0.34*  CALCIUM 8.3*  MG 1.7    Liver Function Tests: Recent Labs  Lab 07/10/19 1034  AST 54*  ALT 26  ALKPHOS 133*  BILITOT 2.8*  PROT 6.0*  ALBUMIN 3.2*   No results for input(s): LIPASE, AMYLASE in the last 168 hours. Recent Labs  Lab 07/10/19 1034  AMMONIA 28    CBC: Recent Labs  Lab 07/10/19 1034  WBC 8.7  NEUTROABS 7.0  HGB 11.1*  HCT RESULTS UNAVAILABLE DUE TO INTERFERING SUBSTANCE  MCV RESULTS UNAVAILABLE DUE TO INTERFERING SUBSTANCE  PLT 217    Cardiac Enzymes: No results for input(s): CKTOTAL, TROPONINI in the last 168 hours.  BNP: Invalid input(s): POCBNP  CBG: No results for input(s): GLUCAP in the last 168 hours.  Microbiology: No results found for this or any previous visit (from the past 720 hour(s)).   Coagulation Studies: Recent Labs    07/10/19 1034  LABPROT 13.6  INR 1.1    Urinalysis: Recent Labs    07/10/19 1034  COLORURINE YELLOW*  LABSPEC 1.002*  PHURINE 6.0  GLUCOSEU NEGATIVE  HGBUR NEGATIVE  BILIRUBINUR NEGATIVE  KETONESUR 5*  PROTEINUR NEGATIVE  NITRITE NEGATIVE  LEUKOCYTESUR SMALL*        Imaging: DG Chest 1 View  Result Date: 07/10/2019 CLINICAL DATA:  Altered mental status EXAM: CHEST  1 VIEW COMPARISON:  11/10/2018 FINDINGS: Chronic right pleural thickening and right basilar atelectasis with volume loss in the right hemithorax. No new consolidation or edema. No pneumothorax. Stable cardiomediastinal contours IMPRESSION: No acute process in the chest. Electronically Signed   By: Macy Mis M.D.   On: 07/10/2019 10:57   CT Head Wo Contrast  Result Date: 07/10/2019 CLINICAL DATA:  Fall, laceration to right parietal area EXAM: CT HEAD WITHOUT CONTRAST TECHNIQUE: Contiguous axial images were obtained from the base of the skull  through the vertex without intravenous contrast. COMPARISON:  None. FINDINGS: Brain: There is no acute intracranial hemorrhage, mass effect, or edema. Gray-white differentiation is preserved. There is no extra-axial fluid collection. Ventricles and sulci are within normal limits in size and configuration. Minimal patchy hypoattenuation in the supratentorial white matter is nonspecific but may reflect minor chronic microvascular ischemic changes. Vascular: No hyperdense vessel or unexpected calcification. Skull: Calvarium is unremarkable. Sinuses/Orbits: No acute finding. Other: None. IMPRESSION: No evidence of acute intracranial injury. Electronically Signed   By: Macy Mis M.D.   On: 07/10/2019 10:53      Assessment & Plan: Pt is a 63 y.o.  female with congestive heart failure, depression, diabetes, fibromyalgia, gout, hyperlipidemia, hypertension, cirrhosis, was admitted on 07/10/2019 with Hyponatremia [E87.1]  #Hyponatremia, symptomatic with confusion Last known sodium of 132 in August 2020 suggesting chronic hyponatremia Comes in with acutely low level of 114.  Baseline mental status is unknown but appears to be confused at present.  Oriented to self and place but not time.  Thinks it is 2004.  Not able to provide meaningful information about her medical conditions Hyponatremia is likely secondary to liver disease, with acute worsening from dehydration  In the ER, currently being treated with IV 3% saline at 50 cc/h Home medications include lactulose 30 g 3 times per day Review of electrolytes suggest possibility of dehydration as sodium, potassium, chloride are all low Urinalysis with specific gravity of 1.002 Chest x-ray suggests chronic right pleural thickening, no acute process Suggest to monitor sodium frequently, minimum every  4 hours, while on 3% saline Goal for correction to 122-124 (8-10 meq) by tomorrow morning   LOS: 0 Ponciano Shealy Candiss Norse 4/10/20215:06 PM    Note: This note  was prepared with Dragon dictation. Any transcription errors are unintentional

## 2019-07-10 NOTE — ED Notes (Signed)
Pt. was assisted to the toilet and back in bed.

## 2019-07-10 NOTE — ED Notes (Signed)
Pt transported to CT ?

## 2019-07-10 NOTE — ED Triage Notes (Signed)
Pt to ED via [facility] from home. Pt IVC. Per EMS family called due to pt being unable to care for herself. Pt currently lives at home alone. Per EMS family found pt on floor.  VSS with EMS.  Upon arrival pt w ulcers bilaterally on feet. Pt with bruising to left forearm. Pt A&Ox2. Pt alert to self and place. Pt disoriented to situation and time.

## 2019-07-10 NOTE — Consult Note (Addendum)
Pharmacy Consult   Pharmacy consulted to monitor sodium levels. Hypertonic 3% solution monitoring due to hyponatremia. Patient is currently on hypertonic 3% @ 50 mL/hr.   @1034   Na 114  @ 1747 Na 124.   Pharmacy will d/w provider hypertonic 3% utility given there was a 10 mEq increase. Pharmacy secure messagedDr. Candiss Norse about rapid increase in sodium level and awaiting response.   Update @ 1945: Per Dr. Candiss Norse, HOLD and reassess at next lab draw. Pharmacy contacted ED nurse of plan.

## 2019-07-10 NOTE — ED Notes (Signed)
Pt with soiled clothing. This RN and Anda Kraft, RN placed pt in clean and dry blue scrubs. Pt unable to provide urine sample at this time.

## 2019-07-10 NOTE — H&P (Addendum)
History and Physical    [name] LPF:[id number] DOB: 1955-08-08 DOA: 07/10/2019  Referring MD/NP/PA:   PCP: [facility]   Patient coming from:  The patient is coming from home.  At baseline, pt is dependent for most of ADL.        Chief Complaint: AMS  HPI: [name] is a 64 y.o. female with medical history significant of liver cirrhosis, hypertension, hyperlipidemia, diabetes mellitus, gout, depression, IBS, fibromyalgia, dCHF, hernia, who presents with altered mental status.  pt has altered mental status, cannot provide accurate medical history.  I tried to have called her daughter twice without success, therefore, most of the history is obtained by discussing the case with ED physician, per EMS report, and with the nursing staff.  Pre report, pt lives alone at home. She was noted to be confused, and is not able to take care of herself. She is not taking her meds, not eating and drinking well. Patient actually comes under IVC due to confusion. Per report, pt has soiled clothing. When I saw pt in ED, she is confused.  She knows her own name, and knows that she is in hospital, but is not orientated to the time.  She moves all extremities.  No active nausea, vomiting, diarrhea noted.  No active cough noted.  Patient denies chest pain. Has had recent admissions for hyponatremia and UTI.  ED Course: pt was found to have sodium 114, potassium 2.6, Mg 1.7, renal function okay, pending CBC, INR 1.1, pending COVID-19 PCR, ammonia level 28, temperature normal, blood pressure 122/70, heart rate 80, oxygen saturation 95% on room air, RR 18, chest x-ray negative, CT of head is negative for acute intracranial abnormalities.  Patient is admitted to stepdown as inpatient.  Nephrologist, Dr. Candiss Norse is consulted.  Review of Systems: Could not be reviewed accurately due to altered mental status.  Allergy:  Allergies  Allergen Reactions  . Vicodin  [Hydrocodone-Acetaminophen] Other (See Comments) and Rash    nightmares nightmares Per patient she crawls all over the bed    Past Medical History:  Diagnosis Date  . Abdominal hernia   . Allergy   . Anemia   . Anginal pain ([place])   . CHF (congestive heart failure) ([place])   . Colonic polyp   . DDD (degenerative disc disease), cervical ddd  . Depression   . Diabetes mellitus without complication ([place])   . Fibromyalgia   . Gout   . Hyperlipidemia   . Hypertension   . IBS (irritable bowel syndrome)   . Liver cirrhosis ([place])   . Polyneuropathy     Past Surgical History:  Procedure Laterality Date  . CERVICAL DISC SURGERY    . CESAREAN SECTION  [id number]  . CHOLECYSTECTOMY  2015  . COLONOSCOPY W/ BIOPSIES AND POLYPECTOMY  06/10/2013  . ESOPHAGOGASTRODUODENOSCOPY    . ESOPHAGOGASTRODUODENOSCOPY (EGD) WITH PROPOFOL N/A 06/27/2017   Procedure: ESOPHAGOGASTRODUODENOSCOPY (EGD) WITH PROPOFOL;  Surgeon: Lollie Sails, MD;  Location[facility] ENDOSCOPY;  Service: Endoscopy;  Laterality: N/A;  . HERNIA REPAIR  2015  . KNEE CARTILAGE SURGERY Right   . partial amputation     toe surgery  . SPINE SURGERY    . TOE AMPUTATION Right 2015   partial amputation 3rd toe  . TONSILLECTOMY  1987  . UMBILICAL HERNIA REPAIR      Social History:  reports that she has never smoked. She has never used smokeless tobacco. She reports that she does not drink alcohol or  use drugs.  Family History:  Family History  Problem Relation Age of Onset  . Alcohol abuse Mother   . Arthritis Mother   . Depression Mother   . Diabetes Mother   . Hypertension Mother   . Mental illness Mother   . Alcohol abuse Father   . Depression Father   . Mental illness Father   . Colon polyps Sister   . Heart disease Maternal Aunt      Prior to Admission medications   Medication Sig Start Date End Date Taking? Authorizing Provider  acetaminophen (TYLENOL) 500 MG tablet Take 500 mg by mouth 3 (three) times daily.      [provider]  buPROPion (WELLBUTRIN XL) 300 MG 24 hr tablet Take 300 mg by mouth daily.    [provider]  cholecalciferol (VITAMIN D3) 10 MCG (400 UNIT) TABS tablet Take 400 Units by mouth daily.    [provider]  citalopram (CELEXA) 40 MG tablet Take 40 mg by mouth daily.    [provider]  furosemide (LASIX) 20 MG tablet Take 20 mg by mouth daily.    [provider]  lactulose (CHRONULAC) 10 GM/15ML solution Take 30 g by mouth 3 (three) times daily.  05/21/13   [provider]  meloxicam (MOBIC) 15 MG tablet Take 15 mg by mouth daily.    [provider]  metFORMIN (GLUCOPHAGE) 500 MG tablet Take 500 mg by mouth daily.  07/12/14   [provider]  methocarbamol (ROBAXIN) 750 MG tablet Take 750 mg by mouth 3 (three) times daily.    [provider]  pantoprazole (PROTONIX) 40 MG tablet Take 40 mg by mouth daily. 07/01/17 11/10/18  [provider]  risperiDONE (RISPERDAL) 2 MG tablet Take 2 mg by mouth 2 (two) times daily.    [provider]  spironolactone (ALDACTONE) 50 MG tablet Take 50 mg by mouth daily.  05/21/13   [provider]  XIFAXAN 550 MG TABS tablet 550 mg 2 (two) times daily.  06/05/13   [provider]    Physical Exam: Vitals:   07/10/19 1203 07/10/19 1230 07/10/19 1248 07/10/19 1258  BP: 124/76 (!) 117/56    Pulse: 96 79 97 86  Resp: (!) 22 19 18    Temp:      TempSrc:      SpO2: 100% 97% 100% 99%  Weight:      Height:       General: Not in acute distress HEENT:       Eyes: PERRL, EOMI, no scleral icterus.       ENT: No discharge from the ears and nose, no pharynx injection, no tonsillar enlargement.        Neck: No JVD, no bruit, no mass felt. Heme: No neck lymph node enlargement. Cardiac: S1/S2, RRR, No murmurs, No gallops or rubs. Respiratory: No rales, wheezing, rhonchi or rubs. GI: Soft, nondistended, nontender, no organomegaly, BS present. GU:  No hematuria Ext: has trace leg edema bilaterally. 2+DP/PT pulse bilaterally. Musculoskeletal: No joint deformities, No joint redness or warmth, no limitation of ROM in spin. Skin: has a ulcer in right great toe, a very small lesion in left 2nd tod, and a scabbed lesion in the left plantar area. No active draining. Neuro: confused,  knows her own name, and knows that she is in hospital, but is not orientated to the time. cranial nerves II-XII grossly intact, moves all extremities. Psych: Patient is not psychotic, no suicidal or hemocidal ideation.  Labs on Admission: I have personally reviewed following labs and imaging studies  CBC: Recent Labs  Lab 07/10/19 1034  WBC 8.7  NEUTROABS 7.0  HGB 11.1*  HCT RESULTS UNAVAILABLE DUE TO INTERFERING SUBSTANCE  MCV RESULTS UNAVAILABLE DUE TO INTERFERING SUBSTANCE  PLT 696   Basic Metabolic Panel: Recent Labs  Lab 07/10/19 1034  NA 114*  114*  K 2.6*  CL 73*  CO2 29  GLUCOSE 137*  BUN <5*  CREATININE 0.34*  CALCIUM 8.3*  MG 1.7   GFR: Estimated Creatinine Clearance: 86.8 mL/min (A) (by C-G formula based on SCr of 0.34 mg/dL (L)). Liver Function Tests: Recent Labs  Lab 07/10/19 1034  AST 54*  ALT 26  ALKPHOS 133*  BILITOT 2.8*  PROT 6.0*  ALBUMIN 3.2*   No results for input(s): LIPASE, AMYLASE in the last 168 hours. Recent Labs  Lab 07/10/19 1034  AMMONIA 28   Coagulation Profile: Recent Labs  Lab 07/10/19 1034  INR 1.1   Cardiac Enzymes: No results for input(s): CKTOTAL, CKMB, CKMBINDEX, TROPONINI in the last 168 hours. BNP (last 3 results) No results for input(s): PROBNP in the last 8760 hours. HbA1C: No results for input(s): HGBA1C in the last 72 hours. CBG: No results for input(s): GLUCAP in the last 168 hours. Lipid Profile: No results for input(s): CHOL, HDL, LDLCALC, TRIG, CHOLHDL, LDLDIRECT in the last 72 hours. Thyroid Function Tests: No results for input(s): TSH, T4TOTAL, FREET4, T3FREE, THYROIDAB  in the last 72 hours. Anemia Panel: No results for input(s): VITAMINB12, FOLATE, FERRITIN, TIBC, IRON, RETICCTPCT in the last 72 hours. Urine analysis:    Component Value Date/Time   COLORURINE YELLOW (A) 07/10/2019 1034   APPEARANCEUR CLEAR (A) 07/10/2019 1034   APPEARANCEUR Hazy 05/26/2013 1912   LABSPEC 1.002 (L) 07/10/2019 1034   LABSPEC 1.024 05/26/2013 1912   PHURINE 6.0 07/10/2019 1034   GLUCOSEU NEGATIVE 07/10/2019 1034   GLUCOSEU Negative 05/26/2013 1912   HGBUR NEGATIVE 07/10/2019 1034   BILIRUBINUR NEGATIVE 07/10/2019 1034   BILIRUBINUR 1+ 05/26/2013 1912   KETONESUR 5 (A) 07/10/2019 [place] 07/10/2019 1034   NITRITE NEGATIVE 07/10/2019 1034   LEUKOCYTESUR SMALL (A) 07/10/2019 1034   LEUKOCYTESUR Negative 05/26/2013 1912   Sepsis Labs: @LABRCNTIP (procalcitonin:4,lacticidven:4) )No results found for this or any previous visit (from the past 240 hour(s)).   Radiological Exams on Admission: DG Chest 1 View  Result Date: 07/10/2019 CLINICAL DATA:  Altered mental status EXAM: CHEST  1 VIEW COMPARISON:  11/10/2018 FINDINGS: Chronic right pleural thickening and right basilar atelectasis with volume loss in the right hemithorax. No new consolidation or edema. No pneumothorax. Stable cardiomediastinal contours IMPRESSION: No acute process in the chest. Electronically Signed   By: Macy Mis M.D.   On: 07/10/2019 10:57   CT Head Wo Contrast  Result Date: 07/10/2019 CLINICAL DATA:  Fall, laceration to right parietal area EXAM: CT HEAD WITHOUT CONTRAST TECHNIQUE: Contiguous axial images were obtained from the base of the skull through the vertex without intravenous contrast. COMPARISON:  None. FINDINGS: Brain: There is no acute intracranial hemorrhage, mass effect, or edema. Gray-white differentiation is preserved. There is no extra-axial fluid collection. Ventricles and sulci are within normal limits in size and configuration. Minimal patchy hypoattenuation in  the supratentorial white matter is nonspecific but may reflect minor chronic microvascular ischemic changes. Vascular: No hyperdense vessel or unexpected calcification. Skull: Calvarium is unremarkable. Sinuses/Orbits: No acute finding. Other: None. IMPRESSION: No evidence of acute intracranial injury. Electronically Signed  By: Macy Mis M.D.   On: 07/10/2019 10:53     EKG:  Not done in ED, will get one.   Assessment/Plan Principal Problem:   Acute metabolic encephalopathy Active Problems:   Diabetes mellitus without complication (HCC)   Hyponatremia   Liver cirrhosis (HCC)   Hypertension   Chronic diastolic CHF (congestive heart failure) (HCC)   Hypokalemia   Acute metabolic encephalopathy: Likely due to hyponatremia.  CT head negative.  Ammonia level 28 normal.  Urinalysis negative. -Admit to stepdown as inpatient - frequent neuro check -Treat hyponatremia as below  Hyponatremia: Likely due to poor oral intake, continuation of her Lasix and spironolactone, possible dehydration. - Will check urine sodium, urine osmolality, serum osmolality. - check TSH - IVF:  500 mL NS in ED - will start 3% of hypertonic saline - f/u by Q2h sodium level x 2 and then q4h - avoid over correction too fast due to risk of central pontine myelinolysis -renal, Dr. Candiss Norse is consulted  Diabetes mellitus without complication [facility]): Most recent A1c 5.1, well controled. Patient is taking Metformin at home -SSI  Liver cirrhosis [facility]): Ammonia level normal 28. INR 1.1 -continue home lactulose  HTN: Bp 122/70. Pt is not on Bp meds -monitoring Bp closely  Chronic diastolic CHF (congestive heart failure) ([place]): 2D echo on 08/16/2015 showed EF > 55%.  Patient has a trace leg edema.  No respiratory distress.  No pulmonary edema chest x-ray.  CHF symptoms compensated.  Patient is not taking diuretics currently. -check BNP  Hypokalemia: K= 2.6  on admission. - Repleted - Check Mg level -->1.7  Foot  ulcer: pt has a ulcer in right great toe, a very small lesion in left 2nd tod, and a scabbed lesion in the left plantar area. No active draining. -wound care consult    Inpatient status:  # Patient requires inpatient status due to high intensity of service, high risk for further deterioration and high frequency of surveillance required.  I certify that at the point of admission it is my clinical judgment that the patient will require inpatient hospital care spanning beyond 2 midnights from the point of admission.  . This patient has multiple chronic comorbidities including liver cirrhosis, hypertension, hyperlipidemia, diabetes mellitus, gout, depression, IBS, fibromyalgia, dCHF, hernia . Now patient has presenting with acute metabolic encephalopathy due to severe hyponatremia, patient also has hypokalemia.  . The worrisome physical exam findings include AMS . The initial radiographic and laboratory data are worrisome because of severe hyponatremia with sodium 114, hypokalemia with a potassium of 2.6 . Current medical needs: please see my assessment and plan . Predictability of an adverse outcome (risk): Patient has multiple comorbidities as listed above. Now presents with acute metabolic encephalopathy due to severe hyponatremia, patient also has hypokalemia.  Patient's presentation is highly complicated.  Patient is at high risk of deteriorating.  Will need to be treated in hospital for at least 2 days.      DVT ppx: SQ Lovenox Code Status: Full code Family Communication: not done, no family member is at bed side.  I tried to have called her daughter twice without success, Disposition Plan:  Anticipate discharge back to previous home environment Consults called:  Dr. Candiss Norse of renal Admission status: SDU/inpation       Date of Service 07/10/2019    [facility]   If 7PM-7AM, please contact night-coverage [email or web address] 07/10/2019, 5:44 PM

## 2019-07-10 NOTE — ED Notes (Signed)
Pt ambulated to toilet with two person assist

## 2019-07-10 NOTE — ED Provider Notes (Signed)
[facility] Emergency Department Provider Note    First MD Initiated Contact with Patient 07/10/19 [phone]     (approximate)  I have reviewed the triage vital signs and the nursing notes.   HISTORY  Chief Complaint Abdominal Pain    HPI [name] is a 64 y.o. female the below listed past medical history presents the ER for evaluation of confusion.  Patient actually comes under IVC due to confusion patient not taking medications reported not eating or drinking.  Lives at home alone and family felt patient does not have capacity or ability to do so safely.  Patient does arrive confused but pleasant and cooperative.  There is no report of any SI or HI.  No reported fevers.  Has had recent admissions for hyponatremia and UTI.    Past Medical History:  Diagnosis Date  . Abdominal hernia   . Allergy   . Anemia   . Anginal pain ([place])   . CHF (congestive heart failure) ([place])   . Colonic polyp   . DDD (degenerative disc disease), cervical ddd  . Depression   . Diabetes mellitus without complication ([place])   . Fibromyalgia   . Gout   . Hyperlipidemia   . Hypertension   . IBS (irritable bowel syndrome)   . Liver cirrhosis ([place])   . Polyneuropathy    Family History  Problem Relation Age of Onset  . Alcohol abuse Mother   . Arthritis Mother   . Depression Mother   . Diabetes Mother   . Hypertension Mother   . Mental illness Mother   . Alcohol abuse Father   . Depression Father   . Mental illness Father   . Colon polyps Sister   . Heart disease Maternal Aunt    Past Surgical History:  Procedure Laterality Date  . CERVICAL DISC SURGERY    . CESAREAN SECTION  [id number]  . CHOLECYSTECTOMY  2015  . COLONOSCOPY W/ BIOPSIES AND POLYPECTOMY  06/10/2013  . ESOPHAGOGASTRODUODENOSCOPY    . ESOPHAGOGASTRODUODENOSCOPY (EGD) WITH PROPOFOL N/A 06/27/2017   Procedure: ESOPHAGOGASTRODUODENOSCOPY (EGD) WITH PROPOFOL;  Surgeon: Lollie Sails, MD;   Location[facility] ENDOSCOPY;  Service: Endoscopy;  Laterality: N/A;  . HERNIA REPAIR  2015  . KNEE CARTILAGE SURGERY Right   . partial amputation     toe surgery  . SPINE SURGERY    . TOE AMPUTATION Right 2015   partial amputation 3rd toe  . TONSILLECTOMY  1987  . UMBILICAL HERNIA REPAIR     Patient Active Problem List   Diagnosis Date Noted  . Acute metabolic encephalopathy 63/14/9702  . Hypertension   . Depression   . Chronic diastolic CHF (congestive heart failure) ([place])   . Hypokalemia   . Liver cirrhosis ([place])   . Hyponatremia 11/10/2018  . Osteoarthritis of knee (Right) 07/14/2017  . Tricompartment osteoarthritis of knee (Right) 07/14/2017  . Chronic knee pain (Primary Area of Pain) (Right) 06/09/2017  . Chronic low back pain (Secondary Area of Pain) (Bilateral) (R>L) 06/09/2017  . Chronic neck pain (Tertiary Area of Pain) 06/09/2017  . Chronic shoulder pain (Fourth Area of Pain) (Bilateral) (R>L) 06/09/2017  . Chronic pain syndrome 06/09/2017  . Pharmacologic therapy 06/09/2017  . Disorder of skeletal system 06/09/2017  . Problems influencing health status 06/09/2017  . DJD (degenerative joint disease) of knee 09/21/2015  . Diabetes mellitus without complication ([place]) 63/78/5885  . Fibromyalgia 09/21/2015  . Cirrhosis of liver ([place]) 09/21/2015  . Hepatic cirrhosis ([place]) 11/18/2014  .  Polyp of colon 11/18/2014      Prior to Admission medications   Medication Sig Start Date End Date Taking? Authorizing Provider  acetaminophen (TYLENOL) 500 MG tablet Take 500 mg by mouth 3 (three) times daily.    Yes [provider]  cholecalciferol (VITAMIN D3) 10 MCG (400 UNIT) TABS tablet Take 400 Units by mouth daily.   Yes [provider]  lactulose (CHRONULAC) 10 GM/15ML solution Take 30 g by mouth 3 (three) times daily.  05/21/13  Yes [provider]  meloxicam (MOBIC) 15 MG tablet Take 15 mg by mouth daily.   Yes [provider]  methocarbamol  (ROBAXIN) 750 MG tablet Take 750 mg by mouth 3 (three) times daily.   Yes [provider]    Allergies Vicodin [hydrocodone-acetaminophen]    Social History Social History   Tobacco Use  . Smoking status: Never Smoker  . Smokeless tobacco: Never Used  Substance Use Topics  . Alcohol use: No  . Drug use: No    Review of Systems Patient denies headaches, rhinorrhea, blurry vision, numbness, shortness of breath, chest pain, edema, cough, abdominal pain, nausea, vomiting, diarrhea, dysuria, fevers, rashes or hallucinations unless otherwise stated above in HPI. ____________________________________________   PHYSICAL EXAM:  VITAL SIGNS: Vitals:   07/10/19 1248 07/10/19 1258  BP:    Pulse: 97 86  Resp: 18   Temp:    SpO2: 100% 99%    Constitutional: Alert and oriented.  Eyes: Conjunctivae are normal.  Head: Atraumatic. Nose: No congestion/rhinnorhea. Mouth/Throat: Mucous membranes are moist.   Neck: No stridor. Painless ROM.  Cardiovascular: Normal rate, regular rhythm. Grossly normal heart sounds.  Good peripheral circulation. Respiratory: Normal respiratory effort.  No retractions. Diminished bs in right lung fields Gastrointestinal: Soft and nontender. No distention. No abdominal bruits. No CVA tenderness. Genitourinary: deferred Musculoskeletal: No lower extremity tenderness, 2+ BLE edema No joint effusions. Neurologic:  Normal speech and language. No gross focal neurologic deficits are appreciated. No facial droop Skin:  Skin is warm, dry and intact. No rash noted. Scattered contusion to left forearm. Psychiatric: Mood and affect are normal. Speech and behavior are normal.  ____________________________________________   LABS (all labs ordered are listed, but only abnormal results are displayed)  Results for orders placed or performed during the hospital encounter of 07/10/19 (from the past 24 hour(s))  CBC with Differential/Platelet     Status: Abnormal    Collection Time: 07/10/19 10:34 AM  Result Value Ref Range   WBC 8.7 4.0 - 10.5 K/uL   RBC RESULTS UNAVAILABLE DUE TO INTERFERING SUBSTANCE 3.87 - 5.11 MIL/uL   Hemoglobin 11.1 (L) 12.0 - 15.0 g/dL   HCT RESULTS UNAVAILABLE DUE TO INTERFERING SUBSTANCE 36.0 - 46.0 %   MCV RESULTS UNAVAILABLE DUE TO INTERFERING SUBSTANCE 80.0 - 100.0 fL   MCH RESULTS UNAVAILABLE DUE TO INTERFERING SUBSTANCE 26.0 - 34.0 pg   MCHC RESULTS UNAVAILABLE DUE TO INTERFERING SUBSTANCE 30.0 - 36.0 g/dL   RDW RESULTS UNAVAILABLE DUE TO INTERFERING SUBSTANCE 11.5 - 15.5 %   Platelets 217 150 - 400 K/uL   nRBC 0.0 0.0 - 0.2 %   Neutrophils Relative % 81 %   Neutro Abs 7.0 1.7 - 7.7 K/uL   Lymphocytes Relative 8 %   Lymphs Abs 0.7 0.7 - 4.0 K/uL   Monocytes Relative 11 %   Monocytes Absolute 0.9 0.1 - 1.0 K/uL   Eosinophils Relative 0 %   Eosinophils Absolute 0.0 0.0 - 0.5 K/uL  Basophils Relative 0 %   Basophils Absolute 0.0 0.0 - 0.1 K/uL   Immature Granulocytes 0 %   Abs Immature Granulocytes 0.03 0.00 - 0.07 K/uL  Comprehensive metabolic panel     Status: Abnormal   Collection Time: 07/10/19 10:34 AM  Result Value Ref Range   Sodium 114 (LL) 135 - 145 mmol/L   Potassium 2.6 (LL) 3.5 - 5.1 mmol/L   Chloride 73 (L) 98 - 111 mmol/L   CO2 29 22 - 32 mmol/L   Glucose, Bld 137 (H) 70 - 99 mg/dL   BUN <5 (L) 8 - 23 mg/dL   Creatinine, Ser 0.34 (L) 0.44 - 1.00 mg/dL   Calcium 8.3 (L) 8.9 - 10.3 mg/dL   Total Protein 6.0 (L) 6.5 - 8.1 g/dL   Albumin 3.2 (L) 3.5 - 5.0 g/dL   AST 54 (H) 15 - 41 U/L   ALT 26 0 - 44 U/L   Alkaline Phosphatase 133 (H) 38 - 126 U/L   Total Bilirubin 2.8 (H) 0.3 - 1.2 mg/dL   GFR calc non Af Amer >60 >60 mL/min   GFR calc Af Amer >60 >60 mL/min   Anion gap 12 5 - 15  Ammonia     Status: None   Collection Time: 07/10/19 10:34 AM  Result Value Ref Range   Ammonia 28 9 - 35 umol/L  Protime-INR     Status: None   Collection Time: 07/10/19 10:34 AM  Result Value Ref Range    Prothrombin Time 13.6 11.4 - 15.2 seconds   INR 1.1 0.8 - 1.2  Urinalysis, Complete w Microscopic     Status: Abnormal   Collection Time: 07/10/19 10:34 AM  Result Value Ref Range   Color, Urine YELLOW (A) YELLOW   APPearance CLEAR (A) CLEAR   Specific Gravity, Urine 1.002 (L) 1.005 - 1.030   pH 6.0 5.0 - 8.0   Glucose, UA NEGATIVE NEGATIVE mg/dL   Hgb urine dipstick NEGATIVE NEGATIVE   Bilirubin Urine NEGATIVE NEGATIVE   Ketones, ur 5 (A) NEGATIVE mg/dL   Protein, ur NEGATIVE NEGATIVE mg/dL   Nitrite NEGATIVE NEGATIVE   Leukocytes,Ua SMALL (A) NEGATIVE   RBC / HPF 0-5 0 - 5 RBC/hpf   WBC, UA 0-5 0 - 5 WBC/hpf   Bacteria, UA RARE (A) NONE SEEN   Squamous Epithelial / LPF 0-5 0 - 5  Magnesium     Status: None   Collection Time: 07/10/19 10:34 AM  Result Value Ref Range   Magnesium 1.7 1.7 - 2.4 mg/dL  Brain natriuretic peptide     Status: Abnormal   Collection Time: 07/10/19 10:34 AM  Result Value Ref Range   B Natriuretic Peptide 114.0 (H) 0.0 - 100.0 pg/mL  Sodium     Status: Abnormal   Collection Time: 07/10/19 10:34 AM  Result Value Ref Range   Sodium 114 (LL) 135 - 145 mmol/L  Osmolality     Status: Abnormal   Collection Time: 07/10/19 10:34 AM  Result Value Ref Range   Osmolality 236 (LL) 275 - 295 mOsm/kg  POC SARS Coronavirus 2 Ag     Status: None   Collection Time: 07/10/19  1:29 PM  Result Value Ref Range   SARS Coronavirus 2 Ag NEGATIVE NEGATIVE   ____________________________________________  EKG My review and personal interpretation at Time: 13:04   Indication: ams  Rate: 100  Rhythm: afib Axis: normal Other: nonspecific st abn ____________________________________________  RADIOLOGY  I personally reviewed all radiographic images ordered  to evaluate for the above acute complaints and reviewed radiology reports and findings.  These findings were personally discussed with the patient.  Please see medical record for radiology  report.  ____________________________________________   PROCEDURES  Procedure(s) performed:  .1-3 Lead EKG Interpretation Performed by: Merlyn Lot, MD Authorized by: Merlyn Lot, MD     Interpretation: normal     ECG rate:  80   ECG rate assessment: normal     Rhythm: sinus rhythm     Ectopy: none     Conduction: normal   .Critical Care Performed by: Merlyn Lot, MD Authorized by: Merlyn Lot, MD   Critical care provider statement:    Critical care time (minutes):  35   Critical care time was exclusive of:  Separately billable procedures and treating other patients   Critical care was necessary to treat or prevent imminent or life-threatening deterioration of the following conditions:  Metabolic crisis   Critical care was time spent personally by me on the following activities:  Development of treatment plan with patient or surrogate, discussions with consultants, evaluation of patient's response to treatment, examination of patient, obtaining history from patient or surrogate, ordering and performing treatments and interventions, ordering and review of laboratory studies, ordering and review of radiographic studies, pulse oximetry, re-evaluation of patient's condition and review of old charts      Critical Care performed: yes ____________________________________________   INITIAL IMPRESSION / [place] / ED COURSE  Pertinent labs & imaging results that were available during my care of the patient were reviewed by me and considered in my medical decision making (see chart for details).   DDX: Hepatic encephalopathy, electrolyte abnormality, dehydration, SDH, SAH, stroke, UTI  Alyah Tameaka Eichhorn is a 65 y.o. who presents to the ED with altered mental status and symptoms as described above.  Patient ill-appearing but cooperative and calm.  Blood work shows evidence of acute hyponatremia.  Symptoms consistent with this process.  Patient likely  is not been taking her medications.  She is agreeable to staying.  I will discontinue IVC as she is not demonstrating any SI or HI.  She may need higher level of care she is unable to be compliant with her medications at home.  Will give IV fluids.  The patient will be placed on continuous pulse oximetry and telemetry for monitoring.  Laboratory evaluation will be sent to evaluate for the above complaints.        The patient was evaluated in Emergency Department today for the symptoms described in the history of present illness. He/she was evaluated in the context of the global COVID-19 pandemic, which necessitated consideration that the patient might be at risk for infection with the SARS-CoV-2 virus that causes COVID-19. Institutional protocols and algorithms that pertain to the evaluation of patients at risk for COVID-19 are in a state of rapid change based on information released by regulatory bodies including the CDC and federal and state organizations. These policies and algorithms were followed during the patient's care in the ED.  As part of my medical decision making, I reviewed the following data within the [place] notes reviewed and incorporated, Labs reviewed, notes from prior ED visits and [place] Controlled Substance Database   ____________________________________________   FINAL CLINICAL IMPRESSION(S) / ED DIAGNOSES  Final diagnoses:  Acute encephalopathy  Acute hyponatremia      NEW MEDICATIONS STARTED DURING THIS VISIT:  New Prescriptions   No medications on file  Note:  This document was prepared using Dragon voice recognition software and may include unintentional dictation errors.    Merlyn Lot, MD 07/10/19 1525

## 2019-07-11 LAB — BASIC METABOLIC PANEL
Anion gap: 10 (ref 5–15)
Anion gap: 8 (ref 5–15)
Anion gap: 10 (ref 5–15)
Anion gap: 9 (ref 5–15)
Anion gap: 9 (ref 5–15)
Anion gap: 7 (ref 5–15)
BUN: <5 mg/dL — ABNORMAL LOW (ref 8–23)
BUN: <5 mg/dL — ABNORMAL LOW (ref 8–23)
BUN: <5 mg/dL — ABNORMAL LOW (ref 8–23)
BUN: <5 mg/dL — ABNORMAL LOW (ref 8–23)
BUN: 6 mg/dL — ABNORMAL LOW (ref 8–23)
BUN: 7 mg/dL — ABNORMAL LOW (ref 8–23)
CO2: 28 mmol/L (ref 22–32)
CO2: 29 mmol/L (ref 22–32)
CO2: 27 mmol/L (ref 22–32)
CO2: 28 mmol/L (ref 22–32)
CO2: 27 mmol/L (ref 22–32)
CO2: 27 mmol/L (ref 22–32)
Calcium: 8.3 mg/dL — ABNORMAL LOW (ref 8.9–10.3)
Calcium: 8.3 mg/dL — ABNORMAL LOW (ref 8.9–10.3)
Calcium: 8.4 mg/dL — ABNORMAL LOW (ref 8.9–10.3)
Calcium: 8.4 mg/dL — ABNORMAL LOW (ref 8.9–10.3)
Calcium: 8.8 mg/dL — ABNORMAL LOW (ref 8.9–10.3)
Calcium: 8.6 mg/dL — ABNORMAL LOW (ref 8.9–10.3)
Chloride: 90 mmol/L — ABNORMAL LOW (ref 98–111)
Chloride: 89 mmol/L — ABNORMAL LOW (ref 98–111)
Chloride: 91 mmol/L — ABNORMAL LOW (ref 98–111)
Chloride: 89 mmol/L — ABNORMAL LOW (ref 98–111)
Chloride: 90 mmol/L — ABNORMAL LOW (ref 98–111)
Chloride: 91 mmol/L — ABNORMAL LOW (ref 98–111)
Creatinine, Ser: 0.32 mg/dL — ABNORMAL LOW (ref 0.44–1.00)
Creatinine, Ser: 0.35 mg/dL — ABNORMAL LOW (ref 0.44–1.00)
Creatinine, Ser: 0.34 mg/dL — ABNORMAL LOW (ref 0.44–1.00)
Creatinine, Ser: 0.52 mg/dL (ref 0.44–1.00)
Creatinine, Ser: 0.48 mg/dL (ref 0.44–1.00)
Creatinine, Ser: 0.43 mg/dL — ABNORMAL LOW (ref 0.44–1.00)
GFR calc Af Amer: >60 mL/min (ref >60)
GFR calc Af Amer: >60 mL/min (ref >60)
GFR calc Af Amer: >60 mL/min (ref >60)
GFR calc Af Amer: >60 mL/min (ref >60)
GFR calc Af Amer: >60 mL/min (ref >60)
GFR calc Af Amer: >60 mL/min (ref >60)
GFR calc non Af Amer: >60 mL/min (ref >60)
GFR calc non Af Amer: >60 mL/min (ref >60)
GFR calc non Af Amer: >60 mL/min (ref >60)
GFR calc non Af Amer: >60 mL/min (ref >60)
GFR calc non Af Amer: >60 mL/min (ref >60)
GFR calc non Af Amer: >60 mL/min (ref >60)
Glucose, Bld: 114 mg/dL — ABNORMAL HIGH (ref 70–99)
Glucose, Bld: 116 mg/dL — ABNORMAL HIGH (ref 70–99)
Glucose, Bld: 119 mg/dL — ABNORMAL HIGH (ref 70–99)
Glucose, Bld: 156 mg/dL — ABNORMAL HIGH (ref 70–99)
Glucose, Bld: 150 mg/dL — ABNORMAL HIGH (ref 70–99)
Glucose, Bld: 122 mg/dL — ABNORMAL HIGH (ref 70–99)
Potassium: 2.7 mmol/L — CL (ref 3.5–5.1)
Potassium: 2.8 mmol/L — ABNORMAL LOW (ref 3.5–5.1)
Potassium: 3.2 mmol/L — ABNORMAL LOW (ref 3.5–5.1)
Potassium: 3.3 mmol/L — ABNORMAL LOW (ref 3.5–5.1)
Potassium: 3.4 mmol/L — ABNORMAL LOW (ref 3.5–5.1)
Potassium: 3.9 mmol/L (ref 3.5–5.1)
Sodium: 128 mmol/L — ABNORMAL LOW (ref 135–145)
Sodium: 126 mmol/L — ABNORMAL LOW (ref 135–145)
Sodium: 128 mmol/L — ABNORMAL LOW (ref 135–145)
Sodium: 126 mmol/L — ABNORMAL LOW (ref 135–145)
Sodium: 126 mmol/L — ABNORMAL LOW (ref 135–145)
Sodium: 125 mmol/L — ABNORMAL LOW (ref 135–145)

## 2019-07-11 LAB — CBC
HCT: 28.6 % — ABNORMAL LOW (ref 36.0–46.0)
Hemoglobin: 10.7 g/dL — ABNORMAL LOW (ref 12.0–15.0)
MCH: 31.1 pg (ref 26.0–34.0)
MCHC: 37.4 g/dL — ABNORMAL HIGH (ref 30.0–36.0)
MCV: 83.1 fL (ref 80.0–100.0)
Platelets: 201 10*3/uL (ref 150–400)
RBC: 3.44 MIL/uL — ABNORMAL LOW (ref 3.87–5.11)
RDW: 14 % (ref 11.5–15.5)
WBC: 7 10*3/uL (ref 4.0–10.5)
nRBC: 0 % (ref 0.0–0.2)

## 2019-07-11 LAB — GLUCOSE, CAPILLARY
Glucose-Capillary: 113 mg/dL — ABNORMAL HIGH (ref 70–99)
Glucose-Capillary: 121 mg/dL — ABNORMAL HIGH (ref 70–99)
Glucose-Capillary: 128 mg/dL — ABNORMAL HIGH (ref 70–99)

## 2019-07-11 LAB — TSH: TSH: 1.186 u[IU]/mL (ref 0.350–4.500)

## 2019-07-11 LAB — MAGNESIUM: Magnesium: 2.2 mg/dL (ref 1.7–2.4)

## 2019-07-11 LAB — OSMOLALITY, URINE: Osmolality, Ur: 64 mOsm/kg — ABNORMAL LOW (ref 300–900)

## 2019-07-11 LAB — SODIUM: Sodium: 126 mmol/L — ABNORMAL LOW (ref 135–145)

## 2019-07-11 MED ORDER — ENOXAPARIN SODIUM 40 MG/0.4ML ~~LOC~~ SOLN
40.0000 mg | SUBCUTANEOUS | Status: DC
  Administered 2019-07-12 – 2019-07-15 (×4): 40 mg via SUBCUTANEOUS
  Filled 2019-07-11 (×2): qty 0.4

## 2019-07-11 MED ORDER — METHOCARBAMOL 500 MG PO TABS
750.0000 mg | ORAL_TABLET | Freq: Three times a day (TID) | ORAL | Status: DC
  Administered 2019-07-11 – 2019-07-15 (×12): 750 mg via ORAL
  Filled 2019-07-11 (×2): qty 2
  Filled 2019-07-11: qty 1
  Filled 2019-07-11 (×9): qty 2
  Filled 2019-07-11: qty 1
  Filled 2019-07-11 (×2): qty 2
  Filled 2019-07-11: qty 1

## 2019-07-11 MED ORDER — SODIUM CHLORIDE 0.9 % IV SOLN: INTRAVENOUS | Status: DC

## 2019-07-11 MED ORDER — POTASSIUM CHLORIDE CRYS ER 20 MEQ PO TBCR
40.0000 meq | EXTENDED_RELEASE_TABLET | Freq: Once | ORAL | Status: AC
  Administered 2019-07-11: 40 meq via ORAL
  Filled 2019-07-11: qty 2

## 2019-07-11 MED ORDER — INSULIN ASPART 100 UNIT/ML ~~LOC~~ SOLN
0.0000 [IU] | Freq: Three times a day (TID) | SUBCUTANEOUS | Status: DC
  Administered 2019-07-11: 1 [IU] via SUBCUTANEOUS
  Filled 2019-07-11: qty 1

## 2019-07-11 MED ORDER — VITAMIN D 25 MCG (1000 UNIT) PO TABS
500.0000 [IU] | ORAL_TABLET | Freq: Every day | ORAL | Status: DC
  Administered 2019-07-12 – 2019-07-15 (×4): 500 [IU] via ORAL
  Filled 2019-07-11 (×4): qty 1

## 2019-07-11 MED ORDER — INSULIN ASPART 100 UNIT/ML ~~LOC~~ SOLN: 0.0000 [IU] | Freq: Every day | SUBCUTANEOUS | Status: DC

## 2019-07-11 MED ORDER — DEXTROSE 5 % IV SOLN: INTRAVENOUS | Status: DC

## 2019-07-11 MED ORDER — POTASSIUM CHLORIDE CRYS ER 20 MEQ PO TBCR
40.0000 meq | EXTENDED_RELEASE_TABLET | ORAL | Status: DC
  Administered 2019-07-11: 40 meq via ORAL
  Filled 2019-07-11: qty 2

## 2019-07-11 MED ORDER — POTASSIUM CHLORIDE CRYS ER 20 MEQ PO TBCR
40.0000 meq | EXTENDED_RELEASE_TABLET | Freq: Once | ORAL | Status: DC
  Filled 2019-07-11: qty 2

## 2019-07-11 MED ORDER — LACTULOSE 10 GM/15ML PO SOLN
30.0000 g | Freq: Three times a day (TID) | ORAL | Status: DC
  Administered 2019-07-11 – 2019-07-15 (×13): 30 g via ORAL
  Filled 2019-07-11 (×12): qty 60

## 2019-07-11 NOTE — Consult Note (Addendum)
Pharmacy Consult   Pharmacy consulted to monitor sodium levels. Hypertonic 3% solution monitoring due to hyponatremia. Patient is currently on hypertonic 3% @ 50 mL/hr.   @1034   Na 114  @ 1747 Na 124.   Pharmacy will d/w provider hypertonic 3% utility given there was a 10 mEq increase. Pharmacy secure messagedDr. Candiss Norse about rapid increase in sodium level and awaiting response.   04/11 @ 0100 Na 126 continue to rise, 3% saline stopped for now, continuing to monitor q4h sodium checks.  Tobie Lords, PharmD, BCPS Clinical Pharmacist 07/11/2019

## 2019-07-11 NOTE — Consult Note (Signed)
Pharmacy Consult   Pharmacy consulted to monitor sodium levels. Hypertonic 3% solution monitoring due to hyponatremia. Patient is currently on hypertonic 3% @ 50 mL/hr.   @1034   Na 114  @ 1747 Na 124.   Pharmacy will d/w provider hypertonic 3% utility given there was a 10 mEq increase. Pharmacy secure messagedDr. Candiss Norse about rapid increase in sodium level and awaiting response.   04/11 @ 0500 Na 128 continue to rise, 3% saline stopped for now, continuing to monitor q4h sodium checks.   Tobie Lords, PharmD, BCPS Clinical Pharmacist 07/11/2019

## 2019-07-11 NOTE — Progress Notes (Signed)
Darrick Meigs [phone]) with Adult Protective Services contacted this nurse requesting an update on pt condition, and stating that " she needs to be  IVC".  MD notified of the above and will handle upon rounds tomorrow.

## 2019-07-11 NOTE — Progress Notes (Signed)
PT Cancellation Note  Patient Details Name: Tache Bobst MRN: [id number] DOB: 08-05-1955   Cancelled Treatment:    Reason Eval/Treat Not Completed: Patient not medically ready, K+ 2.7 , will attempt again when medically appropriate.   Alanson Puls, [place] DPT 07/11/2019, 8:34 AM

## 2019-07-11 NOTE — Progress Notes (Signed)
OT Cancellation Note  Patient Details Name: Gracin Soohoo MRN: [id number] DOB: [birth year]   Cancelled Treatment:    Reason Eval/Treat Not Completed: Patient not medically ready. OT consult received and chart reviewed. Pt currently has K+ 2.7, per therapy protocols will hold OT evaluation until medically appropriate to participate.  Dessie Coma, M.S. OTR/L  07/11/19, 8:43 AM

## 2019-07-11 NOTE — Progress Notes (Signed)
[address] [place], [place] [address] Phone [phone]. Fax [phone]  Date: 07/11/2019                  Patient Name:  [name]  MRN: [id number]  DOB: 1955-10-02  Age / Sex: 64 y.o., female         PCP: [facility]                 Service Requesting Consult: IM/ No att. providers found                 Reason for Consult: Hyponatremia            History of Present Illness: Patient is a 64 y.o. female with multiple medical problems, who was admitted to [facility] on 07/10/2019 for evaluation of confusion. Patient not able to provide meaningful information. Per ER notes, Patient's family called EMS due to her being unable to care for herself.  She lives alone.  She was found on the floor by EMS. Found to have critically low sodium at admission of 114 Last known Na 132 in 10/2018   Today, she feels fair.  Does not remember what she ate for breakfast. Oriented only to self.  Knows her date of birth but does not know the current year or date.   Medications: Outpatient medications: Medications Prior to Admission  Medication Sig Dispense Refill Last Dose  . acetaminophen (TYLENOL) 500 MG tablet Take 500 mg by mouth 3 (three) times daily.    PRN at PRN  . cholecalciferol (VITAMIN D3) 10 MCG (400 UNIT) TABS tablet Take 400 Units by mouth daily.   UNKNOWN at UNKNOWN  . lactulose (CHRONULAC) 10 GM/15ML solution Take 30 g by mouth 3 (three) times daily.    PRN at PRN  . meloxicam (MOBIC) 15 MG tablet Take 15 mg by mouth daily.   UNKNOWN at UNKNOWN  . methocarbamol (ROBAXIN) 750 MG tablet Take 750 mg by mouth 3 (three) times daily.   UNKNOWN at UNKNOWN    Current medications: Current Facility-Administered Medications  Medication Dose Route Frequency Provider Last Rate Last Admin  . cholecalciferol (VITAMIN D3) tablet 500 Units  500 Units Oral Daily Ivor Costa, MD      . enoxaparin (LOVENOX) injection 40 mg  40 mg Subcutaneous Q24H Ivor Costa, MD       . insulin aspart (novoLOG) injection 0-5 Units  0-5 Units Subcutaneous QHS Ivor Costa, MD      . insulin aspart (novoLOG) injection 0-9 Units  0-9 Units Subcutaneous TID WC Ivor Costa, MD      . lactulose ([place]) 10 GM/15ML solution 30 g  30 g Oral TID Ivor Costa, MD      . methocarbamol (ROBAXIN) tablet 750 mg  750 mg Oral TID Ivor Costa, MD      . ondansetron [facility]) injection 4 mg  4 mg Intravenous Q8H PRN Ivor Costa, MD      . potassium chloride SA (KLOR-CON) CR tablet 40 mEq  40 mEq Oral Once Lorella Nimrod, MD          Allergies: Allergies  Allergen Reactions  . Vicodin [Hydrocodone-Acetaminophen] Other (See Comments) and Rash    nightmares nightmares Per patient she crawls all over the bed      Past Medical History: Past Medical History:  Diagnosis Date  . Abdominal hernia   . Allergy   . Anemia   . Anginal pain ([facility])   . CHF (congestive  heart failure) ([place])   . Colonic polyp   . DDD (degenerative disc disease), cervical ddd  . Depression   . Diabetes mellitus without complication ([place])   . Fibromyalgia   . Gout   . Hyperlipidemia   . Hypertension   . IBS (irritable bowel syndrome)   . Liver cirrhosis ([place])   . Polyneuropathy      Past Surgical History: Past Surgical History:  Procedure Laterality Date  . CERVICAL DISC SURGERY    . CESAREAN SECTION  [id number]  . CHOLECYSTECTOMY  2015  . COLONOSCOPY W/ BIOPSIES AND POLYPECTOMY  06/10/2013  . ESOPHAGOGASTRODUODENOSCOPY    . ESOPHAGOGASTRODUODENOSCOPY (EGD) WITH PROPOFOL N/A 06/27/2017   Procedure: ESOPHAGOGASTRODUODENOSCOPY (EGD) WITH PROPOFOL;  Surgeon: Lollie Sails, MD;  Location: [facility] ENDOSCOPY;  Service: Endoscopy;  Laterality: N/A;  . HERNIA REPAIR  2015  . KNEE CARTILAGE SURGERY Right   . partial amputation     toe surgery  . SPINE SURGERY    . TOE AMPUTATION Right 2015   partial amputation 3rd toe  . TONSILLECTOMY  1987  . UMBILICAL HERNIA REPAIR       Family History: Family  History  Problem Relation Age of Onset  . Alcohol abuse Mother   . Arthritis Mother   . Depression Mother   . Diabetes Mother   . Hypertension Mother   . Mental illness Mother   . Alcohol abuse Father   . Depression Father   . Mental illness Father   . Colon polyps Sister   . Heart disease Maternal Aunt      Social History: Social History   Socioeconomic History  . Marital status: Single    Spouse name: Not on file  . Number of children: Not on file  . Years of education: Not on file  . Highest education level: Not on file  Occupational History  . Not on file  Tobacco Use  . Smoking status: Never Smoker  . Smokeless tobacco: Never Used  Substance and Sexual Activity  . Alcohol use: No  . Drug use: No  . Sexual activity: Not on file  Other Topics Concern  . Not on file  Social History Narrative   Patient lives alone and presents today with daughter   Social Determinants of Health   Financial Resource Strain:   . Difficulty of Paying Living Expenses:   Food Insecurity:   . Worried About Charity fundraiser in the Last Year:   . Arboriculturist in the Last Year:   Transportation Needs:   . Film/video editor (Medical):   Marland Kitchen Lack of Transportation (Non-Medical):   Physical Activity:   . Days of Exercise per Week:   . Minutes of Exercise per Session:   Stress:   . Feeling of Stress :   Social Connections:   . Frequency of Communication with Friends and Family:   . Frequency of Social Gatherings with Friends and Family:   . Attends Religious Services:   . Active Member of Clubs or Organizations:   . Attends Archivist Meetings:   Marland Kitchen Marital Status:   Intimate Partner Violence:   . Fear of Current or Ex-Partner:   . Emotionally Abused:   Marland Kitchen Physically Abused:   . Sexually Abused:      Review of Systems: not reliable Gen:  HEENT:  CV:  Resp:  GI: GU :  MS:  Derm:    Psych: Heme:  Neuro:  Endocrine  Vital Signs: Blood  pressure  111/65, pulse (!) 102, temperature 97.7 F (36.5 C), temperature source Oral, resp. rate 17, height 5' 5"  (1.651 m), weight 105.5 kg, SpO2 93 %.   Intake/Output Summary (Last 24 hours) at 07/11/2019 1339 Last data filed at 07/10/2019 1915 Gross per 24 hour  Intake 937.57 ml  Output --  Net 937.57 ml    Weight trends: [employer]   07/10/19 0923  Weight: 105.5 kg    Physical Exam: General:  NAD, lying in the bed  HEENT  dry mouth  Neck:  Supple, no masses  Lungs:  Clear to auscultation  Heart::  Irregular rhythm  Abdomen:  Soft, nontender  Extremities:  no edema  Neurologic: Alert, oriented only to self  Skin: Bruise on left forearm    Lab results: Basic Metabolic Panel: Recent Labs  Lab 07/10/19 1034 07/10/19 1747 07/11/19 0440 07/11/19 0730 07/11/19 1233  NA 114*  114*   < > 128* 126* 128*  K 2.6*  --  2.7* 2.8* 3.2*  CL 73*  --  90* 89* 91*  CO2 29  --  28 29 27   GLUCOSE 137*  --  114* 116* 119*  BUN <5*  --  <5* <5* <5*  CREATININE 0.34*  --  0.32* 0.35* 0.34*  CALCIUM 8.3*  --  8.3* 8.3* 8.4*  MG 1.7  --  2.2  --   --    < > = values in this interval not displayed.    Liver Function Tests: Recent Labs  Lab 07/10/19 1034  AST 54*  ALT 26  ALKPHOS 133*  BILITOT 2.8*  PROT 6.0*  ALBUMIN 3.2*   No results for input(s): LIPASE, AMYLASE in the last 168 hours. Recent Labs  Lab 07/10/19 1034  AMMONIA 28    CBC: Recent Labs  Lab 07/10/19 1034 07/11/19 0440  WBC 8.7 7.0  NEUTROABS 7.0  --   HGB 11.1* 10.7*  HCT RESULTS UNAVAILABLE DUE TO INTERFERING SUBSTANCE 28.6*  MCV RESULTS UNAVAILABLE DUE TO INTERFERING SUBSTANCE 83.1  PLT 217 201    Cardiac Enzymes: No results for input(s): CKTOTAL, TROPONINI in the last 168 hours.  BNP: Invalid input(s): POCBNP  CBG: Recent Labs  Lab 07/11/19 0828  GLUCAP 113*    Microbiology: Recent Results (from the past 720 hour(s))  SARS CORONAVIRUS 2 (TAT 6-24 HRS) Nasopharyngeal Nasopharyngeal  Swab     Status: None   Collection Time: 07/10/19 10:34 AM   Specimen: Nasopharyngeal Swab  Result Value Ref Range Status   SARS Coronavirus 2 NEGATIVE NEGATIVE Final    Comment: (NOTE) SARS-CoV-2 target nucleic acids are NOT DETECTED. The SARS-CoV-2 RNA is generally detectable in upper and lower respiratory specimens during the acute phase of infection. Negative results do not preclude SARS-CoV-2 infection, do not rule out co-infections with other pathogens, and should not be used as the sole basis for treatment or other patient management decisions. Negative results must be combined with clinical observations, patient history, and epidemiological information. The expected result is Negative. Fact Sheet for Patients: SugarRoll.be Fact Sheet for Healthcare Providers: https://www.woods-mathews.com/ This test is not yet approved or cleared by the [place] FDA and  has been authorized for detection and/or diagnosis of SARS-CoV-2 by FDA under an Emergency Use Authorization (EUA). This EUA will remain  in effect (meaning this test can be used) for the duration of the COVID-19 declaration under Section 56 4(b)(1) of the Act, 21 U.S.C. section 360bbb-3(b)(1), unless the authorization is terminated or revoked sooner. Performed  at [facility], [place] [address] [place], [place] [address]      Coagulation Studies: Recent Labs    07/10/19 1034  LABPROT 13.6  INR 1.1    Urinalysis: Recent Labs    07/10/19 1034  COLORURINE YELLOW*  LABSPEC 1.002*  PHURINE 6.0  GLUCOSEU NEGATIVE  HGBUR NEGATIVE  BILIRUBINUR NEGATIVE  KETONESUR 5*  PROTEINUR NEGATIVE  NITRITE NEGATIVE  LEUKOCYTESUR SMALL*        Imaging: DG Chest 1 View  Result Date: 07/10/2019 CLINICAL DATA:  Altered mental status EXAM: CHEST  1 VIEW COMPARISON:  11/10/2018 FINDINGS: Chronic right pleural thickening and right basilar atelectasis with volume loss in the  right hemithorax. No new consolidation or edema. No pneumothorax. Stable cardiomediastinal contours IMPRESSION: No acute process in the chest. Electronically Signed   By: Macy Mis M.D.   On: 07/10/2019 10:57   CT Head Wo Contrast  Result Date: 07/10/2019 CLINICAL DATA:  Fall, laceration to right parietal area EXAM: CT HEAD WITHOUT CONTRAST TECHNIQUE: Contiguous axial images were obtained from the base of the skull through the vertex without intravenous contrast. COMPARISON:  None. FINDINGS: Brain: There is no acute intracranial hemorrhage, mass effect, or edema. Gray-white differentiation is preserved. There is no extra-axial fluid collection. Ventricles and sulci are within normal limits in size and configuration. Minimal patchy hypoattenuation in the supratentorial white matter is nonspecific but may reflect minor chronic microvascular ischemic changes. Vascular: No hyperdense vessel or unexpected calcification. Skull: Calvarium is unremarkable. Sinuses/Orbits: No acute finding. Other: None. IMPRESSION: No evidence of acute intracranial injury. Electronically Signed   By: Macy Mis M.D.   On: 07/10/2019 10:53     Assessment & Plan: Pt is a 64 y.o.  female with congestive heart failure, depression, diabetes, fibromyalgia, gout, hyperlipidemia, hypertension, cirrhosis, was admitted on 07/10/2019 with Acute hyponatremia [E87.1] Hyponatremia [E87.1] Acute encephalopathy [D17.61] Acute metabolic encephalopathy [Y07.37]  #Hyponatremia, symptomatic with confusion Last known sodium of 132 in August 2020 suggesting chronic hyponatremia Comes in with acutely low level of 114.  Baseline mental status is unknown but appears to be confused at present.  Oriented to self and place but not time.   Chronic hyponatremia is likely secondary to liver disease, with acute worsening from dehydration.  TSH normal at 1.186  Patient was treated with 3% saline initially.  It was discontinued when sodium  increased from 114-128 Since then sodium level has been stable between 126-128 Average rate of correction is acceptable Encourage oral intake of food or Ensure Continue to monitor sodium closely.  #Hypokalemia Likely nutritional Expected to improve with better nutrition   LOS: 1 Reed Eifert 4/11/20211:39 PM    Note: This note was prepared with Dragon dictation. Any transcription errors are unintentional

## 2019-07-11 NOTE — Progress Notes (Signed)
PROGRESS NOTE    [name]  DJT:[id number] DOB: 1956/02/08 DOA: 07/10/2019 PCP: [facility] [place]   Brief Narrative:  [name] is a 64 y.o. female with medical history significant of liver cirrhosis, hypertension, hyperlipidemia, diabetes mellitus, gout, depression, IBS, fibromyalgia, dCHF, hernia, who presents with altered mental status. Pre report, pt lives alone at home. She was noted to be confused, and is not able to take care of herself. She is not taking her meds, not eating and drinking well. Patient actually comes under IVC due to confusion. Per report, pt has soiled clothing. When I saw pt in ED, she is confused.  She knows her own name, and knows that she is in hospital, but is not orientated to the time.  Has had recent admissions for hyponatremia and UTI. Found to have sodium of 114, started on 3% normal saline in ED, which was discontinued overnight due to overcorrection.  Subjective: Patient is alert but oriented to self only.  She does know that she is in hospital but do not know why she is here.  She is not oriented to time.  One time she told me that she drinks daily next minute she told me that she do not.  Unable to answer questions regarding any withdrawal symptoms.  She said she do not have any kids and lives with her mother.  When asked where is her mother then she said I do not know.  Assessment & Plan:   Principal Problem:   Acute metabolic encephalopathy Active Problems:   Diabetes mellitus without complication (HCC)   Hyponatremia   Liver cirrhosis (HCC)   Hypertension   Chronic diastolic CHF (congestive heart failure) (HCC)   Hypokalemia  Hyponatremia.  Most likely secondary to poor p.o. intake.  Hyponatremia labs are more consistent with hypovolemic hypotonic hyponatremia.  She was initially treated with 3% hypertonic saline resulted overcorrection of sodium to 128. Hypertonic saline was discontinued and her sodium dropped to 126  without any further intervention. -Continue to monitor sodium-if continue to drop then we will start her on normal saline.  Goal is 8-10 in 24 hours. -Nephrology was consulted-appreciate their recommendations.  Hypokalemia.  Hypokalemia with potassium of 2.7 and magnesium of 2.2.  Improved to 3.2 with replacement. -Repleat electrolytes and monitor.  Acute metabolic encephalopathy.  Most likely due to hyponatremia.  CT head negative.  Ammonia level within normal limit at 28.  UA negative. Unknown baseline.  Patient apparently lives alone and some family member found her on floor and called the EMS.  Call daughter with no response. Patient told me that she do not have any kids, 1 daughter listed in her chart. -We will get PT/OT evaluation once more stable.  Diabetes mellitus without complication ([place]): Most recent A1c 5.1, well controled. Patient is taking Metformin at home -SSI  Liver cirrhosis [facility]): Ammonia level normal 28. INR 1.1 -continue home lactulose  HTN:  Pt is not on Bp meds.  Blood pressure within goal. -monitoring Bp closely  Chronic diastolic CHF (congestive heart failure) ([place]): 2D echo on 08/16/2015 showed EF > 55%.  Patient has a trace leg edema.  No respiratory distress.  No pulmonary edema chest x-ray.  CHF symptoms compensated.  Patient is not taking diuretics currently. -check BN- 114  Foot ulcer: pt has a ulcer in right great toe, a very small lesion in left 2nd tod, and a scabbed lesion in the left plantar area. No active draining. -wound care consult.  Objective: Vitals:   07/11/19 0900 07/11/19 1100 07/11/19 1101 07/11/19 1147  BP: 112/64 115/61  111/65  Pulse: (!) 104  91 (!) 102  Resp: 13  17   Temp:    97.7 F (36.5 C)  TempSrc:    Oral  SpO2: 90%  92% 93%  Weight:      Height:        Intake/Output Summary (Last 24 hours) at 07/11/2019 1315 Last data filed at 07/10/2019 1915 Gross per 24 hour  Intake 1038.57 ml  Output -  Net 1038.57 ml    Filed Weights   07/10/19 0923  Weight: 105.5 kg    Examination:  General exam: Appears calm and comfortable  Respiratory system: Clear to auscultation. Respiratory effort normal. Cardiovascular system: S1 & S2 heard, RRR. No JVD, murmurs, rubs, gallops or clicks. Gastrointestinal system: Soft, nontender, nondistended, bowel sounds positive. Central nervous system: Alert and oriented to self and place not to time.  No focal neurological deficits. Extremities: No edema, no cyanosis, pulses intact and symmetrical. Psychiatry: Judgement and insight appear impaired.   DVT prophylaxis: Lovenox Code Status: Full Family Communication: Call daughter with no response . Disposition Plan:  Status is: Inpatient  Remains inpatient appropriate because:Persistent severe electrolyte disturbances   Dispo: The patient is from: Home              Anticipated d/c is to: To be determined as she lives alone and quite confused now              Anticipated d/c date is: 2 days              Patient currently is not medically stable to d/c.  Consultants:  Nephrology  Procedures:  Antimicrobials:   Data Reviewed: I have personally reviewed following labs and imaging studies  CBC: Recent Labs  Lab 07/10/19 1034 07/11/19 0440  WBC 8.7 7.0  NEUTROABS 7.0  --   HGB 11.1* 10.7*  HCT RESULTS UNAVAILABLE DUE TO INTERFERING SUBSTANCE 28.6*  MCV RESULTS UNAVAILABLE DUE TO INTERFERING SUBSTANCE 83.1  PLT 217 466   Basic Metabolic Panel: Recent Labs  Lab 07/10/19 1034 07/10/19 1034 07/10/19 1747 07/11/19 0036 07/11/19 0440 07/11/19 0730 07/11/19 1233  NA 114*  114*   < > 124* 126* 128* 126* 128*  K 2.6*  --   --   --  2.7* 2.8* 3.2*  CL 73*  --   --   --  90* 89* 91*  CO2 29  --   --   --  28 29 27   GLUCOSE 137*  --   --   --  114* 116* 119*  BUN <5*  --   --   --  <5* <5* <5*  CREATININE 0.34*  --   --   --  0.32* 0.35* 0.34*  CALCIUM 8.3*  --   --   --  8.3* 8.3* 8.4*  MG 1.7  --    --   --  2.2  --   --    < > = values in this interval not displayed.   GFR: Estimated Creatinine Clearance: 86.8 mL/min (A) (by C-G formula based on SCr of 0.34 mg/dL (L)). Liver Function Tests: Recent Labs  Lab 07/10/19 1034  AST 54*  ALT 26  ALKPHOS 133*  BILITOT 2.8*  PROT 6.0*  ALBUMIN 3.2*   No results for input(s): LIPASE, AMYLASE in the last 168 hours. Recent Labs  Lab 07/10/19 1034  AMMONIA 28  Coagulation Profile: Recent Labs  Lab 07/10/19 1034  INR 1.1   Cardiac Enzymes: No results for input(s): CKTOTAL, CKMB, CKMBINDEX, TROPONINI in the last 168 hours. BNP (last 3 results) No results for input(s): PROBNP in the last 8760 hours. HbA1C: No results for input(s): HGBA1C in the last 72 hours. CBG: Recent Labs  Lab 07/11/19 0828  GLUCAP 113*   Lipid Profile: No results for input(s): CHOL, HDL, LDLCALC, TRIG, CHOLHDL, LDLDIRECT in the last 72 hours. Thyroid Function Tests: Recent Labs    07/11/19 0440  TSH 1.186   Anemia Panel: No results for input(s): VITAMINB12, FOLATE, FERRITIN, TIBC, IRON, RETICCTPCT in the last 72 hours. Sepsis Labs: No results for input(s): PROCALCITON, LATICACIDVEN in the last 168 hours.  Recent Results (from the past 240 hour(s))  SARS CORONAVIRUS 2 (TAT 6-24 HRS) Nasopharyngeal Nasopharyngeal Swab     Status: None   Collection Time: 07/10/19 10:34 AM   Specimen: Nasopharyngeal Swab  Result Value Ref Range Status   SARS Coronavirus 2 NEGATIVE NEGATIVE Final    Comment: (NOTE) SARS-CoV-2 target nucleic acids are NOT DETECTED. The SARS-CoV-2 RNA is generally detectable in upper and lower respiratory specimens during the acute phase of infection. Negative results do not preclude SARS-CoV-2 infection, do not rule out co-infections with other pathogens, and should not be used as the sole basis for treatment or other patient management decisions. Negative results must be combined with clinical observations, patient history,  and epidemiological information. The expected result is Negative. Fact Sheet for Patients: SugarRoll.be Fact Sheet for Healthcare Providers: https://www.woods-mathews.com/ This test is not yet approved or cleared by the [place] FDA and  has been authorized for detection and/or diagnosis of SARS-CoV-2 by FDA under an Emergency Use Authorization (EUA). This EUA will remain  in effect (meaning this test can be used) for the duration of the COVID-19 declaration under Section 56 4(b)(1) of the Act, 21 U.S.C. section 360bbb-3(b)(1), unless the authorization is terminated or revoked sooner. Performed at [facility], [place] [address], [place], [place] [address]      Radiology Studies: DG Chest 1 View  Result Date: 07/10/2019 CLINICAL DATA:  Altered mental status EXAM: CHEST  1 VIEW COMPARISON:  11/10/2018 FINDINGS: Chronic right pleural thickening and right basilar atelectasis with volume loss in the right hemithorax. No new consolidation or edema. No pneumothorax. Stable cardiomediastinal contours IMPRESSION: No acute process in the chest. Electronically Signed   By: Macy Mis M.D.   On: 07/10/2019 10:57   CT Head Wo Contrast  Result Date: 07/10/2019 CLINICAL DATA:  Fall, laceration to right parietal area EXAM: CT HEAD WITHOUT CONTRAST TECHNIQUE: Contiguous axial images were obtained from the base of the skull through the vertex without intravenous contrast. COMPARISON:  None. FINDINGS: Brain: There is no acute intracranial hemorrhage, mass effect, or edema. Gray-white differentiation is preserved. There is no extra-axial fluid collection. Ventricles and sulci are within normal limits in size and configuration. Minimal patchy hypoattenuation in the supratentorial white matter is nonspecific but may reflect minor chronic microvascular ischemic changes. Vascular: No hyperdense vessel or unexpected calcification. Skull: Calvarium is unremarkable.  Sinuses/Orbits: No acute finding. Other: None. IMPRESSION: No evidence of acute intracranial injury. Electronically Signed   By: Macy Mis M.D.   On: 07/10/2019 10:53    Scheduled Meds: . cholecalciferol  500 Units Oral Daily  . enoxaparin (LOVENOX) injection  40 mg Subcutaneous Q24H  . insulin aspart  0-5 Units Subcutaneous QHS  . insulin aspart  0-9  Units Subcutaneous TID WC  . lactulose  30 g Oral TID  . methocarbamol  750 mg Oral TID  . potassium chloride  40 mEq Oral Q4H   Continuous Infusions:   LOS: 1 day   Time spent: 45 minutes.  Lorella Nimrod, MD [facility]  If 7PM-7AM, please contact night-coverage [email or web address]  07/11/2019, 1:15 PM   This record has been created using Systems analyst. Errors have been sought and corrected,but may not always be located. Such creation errors do not reflect on the standard of care.

## 2019-07-12 LAB — BASIC METABOLIC PANEL
Anion gap: 7 (ref 5–15)
Anion gap: 8 (ref 5–15)
Anion gap: 8 (ref 5–15)
Anion gap: 8 (ref 5–15)
Anion gap: 10 (ref 5–15)
BUN: 6 mg/dL — ABNORMAL LOW (ref 8–23)
BUN: <5 mg/dL — ABNORMAL LOW (ref 8–23)
BUN: 5 mg/dL — ABNORMAL LOW (ref 8–23)
BUN: 5 mg/dL — ABNORMAL LOW (ref 8–23)
BUN: 5 mg/dL — ABNORMAL LOW (ref 8–23)
CO2: 27 mmol/L (ref 22–32)
CO2: 24 mmol/L (ref 22–32)
CO2: 26 mmol/L (ref 22–32)
CO2: 26 mmol/L (ref 22–32)
CO2: 25 mmol/L (ref 22–32)
Calcium: 8.4 mg/dL — ABNORMAL LOW (ref 8.9–10.3)
Calcium: 8.3 mg/dL — ABNORMAL LOW (ref 8.9–10.3)
Calcium: 8.3 mg/dL — ABNORMAL LOW (ref 8.9–10.3)
Calcium: 8.8 mg/dL — ABNORMAL LOW (ref 8.9–10.3)
Calcium: 8.7 mg/dL — ABNORMAL LOW (ref 8.9–10.3)
Chloride: 92 mmol/L — ABNORMAL LOW (ref 98–111)
Chloride: 94 mmol/L — ABNORMAL LOW (ref 98–111)
Chloride: 92 mmol/L — ABNORMAL LOW (ref 98–111)
Chloride: 93 mmol/L — ABNORMAL LOW (ref 98–111)
Chloride: 91 mmol/L — ABNORMAL LOW (ref 98–111)
Creatinine, Ser: 0.41 mg/dL — ABNORMAL LOW (ref 0.44–1.00)
Creatinine, Ser: 0.32 mg/dL — ABNORMAL LOW (ref 0.44–1.00)
Creatinine, Ser: 0.47 mg/dL (ref 0.44–1.00)
Creatinine, Ser: 0.42 mg/dL — ABNORMAL LOW (ref 0.44–1.00)
Creatinine, Ser: 0.31 mg/dL — ABNORMAL LOW (ref 0.44–1.00)
GFR calc Af Amer: >60 mL/min (ref >60)
GFR calc Af Amer: >60 mL/min (ref >60)
GFR calc Af Amer: >60 mL/min (ref >60)
GFR calc Af Amer: >60 mL/min (ref >60)
GFR calc Af Amer: >60 mL/min (ref >60)
GFR calc non Af Amer: >60 mL/min (ref >60)
GFR calc non Af Amer: >60 mL/min (ref >60)
GFR calc non Af Amer: >60 mL/min (ref >60)
GFR calc non Af Amer: >60 mL/min (ref >60)
GFR calc non Af Amer: >60 mL/min (ref >60)
Glucose, Bld: 113 mg/dL — ABNORMAL HIGH (ref 70–99)
Glucose, Bld: 112 mg/dL — ABNORMAL HIGH (ref 70–99)
Glucose, Bld: 114 mg/dL — ABNORMAL HIGH (ref 70–99)
Glucose, Bld: 125 mg/dL — ABNORMAL HIGH (ref 70–99)
Glucose, Bld: 118 mg/dL — ABNORMAL HIGH (ref 70–99)
Potassium: 3.6 mmol/L (ref 3.5–5.1)
Potassium: 3.5 mmol/L (ref 3.5–5.1)
Potassium: 3.4 mmol/L — ABNORMAL LOW (ref 3.5–5.1)
Potassium: 3.7 mmol/L (ref 3.5–5.1)
Potassium: 3.2 mmol/L — ABNORMAL LOW (ref 3.5–5.1)
Sodium: 126 mmol/L — ABNORMAL LOW (ref 135–145)
Sodium: 126 mmol/L — ABNORMAL LOW (ref 135–145)
Sodium: 126 mmol/L — ABNORMAL LOW (ref 135–145)
Sodium: 127 mmol/L — ABNORMAL LOW (ref 135–145)
Sodium: 126 mmol/L — ABNORMAL LOW (ref 135–145)

## 2019-07-12 LAB — GLUCOSE, CAPILLARY
Glucose-Capillary: 104 mg/dL — ABNORMAL HIGH (ref 70–99)
Glucose-Capillary: 116 mg/dL — ABNORMAL HIGH (ref 70–99)
Glucose-Capillary: 114 mg/dL — ABNORMAL HIGH (ref 70–99)
Glucose-Capillary: 141 mg/dL — ABNORMAL HIGH (ref 70–99)

## 2019-07-12 LAB — HEPATIC FUNCTION PANEL
ALT: 21 U/L (ref 0–44)
AST: 35 U/L (ref 15–41)
Albumin: 2.8 g/dL — ABNORMAL LOW (ref 3.5–5.0)
Alkaline Phosphatase: 112 U/L (ref 38–126)
Bilirubin, Direct: 0.6 mg/dL — ABNORMAL HIGH (ref 0.0–0.2)
Indirect Bilirubin: 1.1 mg/dL — ABNORMAL HIGH (ref 0.3–0.9)
Total Bilirubin: 1.7 mg/dL — ABNORMAL HIGH (ref 0.3–1.2)
Total Protein: 5.2 g/dL — ABNORMAL LOW (ref 6.5–8.1)

## 2019-07-12 LAB — PROTIME-INR
INR: 1.1 (ref 0.8–1.2)
Prothrombin Time: 14.4 seconds (ref 11.4–15.2)

## 2019-07-12 MED ORDER — SODIUM CHLORIDE 1 G PO TABS
1.0000 g | ORAL_TABLET | Freq: Two times a day (BID) | ORAL | Status: DC
  Administered 2019-07-12 – 2019-07-15 (×7): 1 g via ORAL
  Filled 2019-07-12 (×9): qty 1

## 2019-07-12 MED ORDER — TORSEMIDE 10 MG PO TABS
5.0000 mg | ORAL_TABLET | Freq: Every day | ORAL | Status: DC
  Administered 2019-07-12 – 2019-07-15 (×4): 5 mg via ORAL
  Filled 2019-07-12 (×5): qty 0.5

## 2019-07-12 NOTE — Progress Notes (Signed)
[facility] MD Progress Note  07/12/2019 4:52 PM [name]  MRN:  [id number]  [name] a 63 y.o.femalewith medical history significant ofliver cirrhosis, hypertension, hyperlipidemia, diabetes mellitus, gout, depression, IBS, fibromyalgia,dCHF, hernia, who presents with altered mental status. Pre report, pt lives alone at home. She was noted to be confused, and is not able totake care of herself. She is not taking her meds, not eating and drinking well.Patient actually comes under IVC due to confusion. Per report, pt has soiled clothing.When I saw pt in ED, she is confused.She knows her own name,and knows that she is in hospital, but is not orientated to the time.  Has had recent admissions for hyponatremia and UTI. Found to have sodium of 114, started on 3% normal saline in ED, which was discontinued overnight due to overcorrection.  Subjective:  Pt oriented to self. Mental status per below and history above clarifies that she cannot take care of herself without assistance.   Principal Problem: Acute metabolic encephalopathy Diagnosis: Principal Problem:   Acute metabolic encephalopathy Active Problems:   Diabetes mellitus without complication (HCC)   Hyponatremia   Liver cirrhosis (HCC)   Hypertension   Chronic diastolic CHF (congestive heart failure) (HCC)   Hypokalemia  Total Time spent with patient: 15 minutes  Past Psychiatric History:   Past Medical History:  Past Medical History:  Diagnosis Date  . Abdominal hernia   . Allergy   . Anemia   . Anginal pain ([place])   . CHF (congestive heart failure) ([place])   . Colonic polyp   . DDD (degenerative disc disease), cervical ddd  . Depression   . Diabetes mellitus without complication ([place])   . Fibromyalgia   . Gout   . Hyperlipidemia   . Hypertension   . IBS (irritable bowel syndrome)   . Liver cirrhosis ([place])   . Polyneuropathy     Past Surgical History:  Procedure Laterality Date  . CERVICAL DISC  SURGERY    . CESAREAN SECTION  [id number]  . CHOLECYSTECTOMY  2015  . COLONOSCOPY W/ BIOPSIES AND POLYPECTOMY  06/10/2013  . ESOPHAGOGASTRODUODENOSCOPY    . ESOPHAGOGASTRODUODENOSCOPY (EGD) WITH PROPOFOL N/A 06/27/2017   Procedure: ESOPHAGOGASTRODUODENOSCOPY (EGD) WITH PROPOFOL;  Surgeon: Lollie Sails, MD;  Location: [facility] ENDOSCOPY;  Service: Endoscopy;  Laterality: N/A;  . HERNIA REPAIR  2015  . KNEE CARTILAGE SURGERY Right   . partial amputation     toe surgery  . SPINE SURGERY    . TOE AMPUTATION Right 2015   partial amputation 3rd toe  . TONSILLECTOMY  1987  . UMBILICAL HERNIA REPAIR     Family History:  Family History  Problem Relation Age of Onset  . Alcohol abuse Mother   . Arthritis Mother   . Depression Mother   . Diabetes Mother   . Hypertension Mother   . Mental illness Mother   . Alcohol abuse Father   . Depression Father   . Mental illness Father   . Colon polyps Sister   . Heart disease Maternal Aunt    Family Psychiatric  History:  Social History:  Social History   Substance and Sexual Activity  Alcohol Use No     Social History   Substance and Sexual Activity  Drug Use No    Social History   Socioeconomic History  . Marital status: Single    Spouse name: Not on file  . Number of children: Not on file  . Years of education: Not on file  .  Highest education level: Not on file  Occupational History  . Not on file  Tobacco Use  . Smoking status: Never Smoker  . Smokeless tobacco: Never Used  Substance and Sexual Activity  . Alcohol use: No  . Drug use: No  . Sexual activity: Not on file  Other Topics Concern  . Not on file  Social History Narrative   Patient lives alone and presents today with daughter   Social Determinants of Health   Financial Resource Strain:   . Difficulty of Paying Living Expenses:   Food Insecurity:   . Worried About Charity fundraiser in the Last Year:   . Arboriculturist in the Last Year:    Transportation Needs:   . Film/video editor (Medical):   Marland Kitchen Lack of Transportation (Non-Medical):   Physical Activity:   . Days of Exercise per Week:   . Minutes of Exercise per Session:   Stress:   . Feeling of Stress :   Social Connections:   . Frequency of Communication with Friends and Family:   . Frequency of Social Gatherings with Friends and Family:   . Attends Religious Services:   . Active Member of Clubs or Organizations:   . Attends Archivist Meetings:   Marland Kitchen Marital Status:    Additional Social History:                         Sleep: Appetite:  Current Medications: Current Facility-Administered Medications  Medication Dose Route Frequency Provider Last Rate Last Admin  . cholecalciferol (VITAMIN D3) tablet 500 Units  500 Units Oral Daily Ivor Costa, MD   500 Units at 07/12/19 [phone]  . enoxaparin (LOVENOX) injection 40 mg  40 mg Subcutaneous Q24H Ivor Costa, MD   40 mg at 07/12/19 0532  . insulin aspart (novoLOG) injection 0-5 Units  0-5 Units Subcutaneous QHS Ivor Costa, MD      . insulin aspart (novoLOG) injection 0-9 Units  0-9 Units Subcutaneous TID WC Ivor Costa, MD   1 Units at 07/11/19 1708  . lactulose (CHRONULAC) 10 GM/15ML solution 30 g  30 g Oral TID Ivor Costa, MD   30 g at 07/12/19 0929  . methocarbamol (ROBAXIN) tablet 750 mg  750 mg Oral TID Ivor Costa, MD   750 mg at 07/12/19 0928  . ondansetron (ZOFRAN) injection 4 mg  4 mg Intravenous Q8H PRN Ivor Costa, MD      . sodium chloride tablet 1 g  1 g Oral BID WC Murlean Iba, MD   1 g at 07/12/19 0926  . torsemide (DEMADEX) tablet 5 mg  5 mg Oral Daily Murlean Iba, MD   5 mg at 07/12/19 1652    Lab Results:  Results for orders placed or performed during the hospital encounter of 07/10/19 (from the past 48 hour(s))  Sodium     Status: Abnormal   Collection Time: 07/10/19  5:47 PM  Result Value Ref Range   Sodium 124 (L) 135 - 145 mmol/L    Comment: Performed at [facility], [address] [place], [place] [address]  Sodium     Status: Abnormal   Collection Time: 07/11/19 12:36 AM  Result Value Ref Range   Sodium 126 (L) 135 - 145 mmol/L    Comment: Performed at [facility], [address], [place],  [address]  Basic metabolic panel     Status: Abnormal   Collection Time:  07/11/19  4:40 AM  Result Value Ref Range   Sodium 128 (L) 135 - 145 mmol/L   Potassium 2.7 (LL) 3.5 - 5.1 mmol/L    Comment: CRITICAL RESULT CALLED TO, READ BACK BY AND VERIFIED WITH TOM NAGY ON 07/11/19 AT 0531 [facility]    Chloride 90 (L) 98 - 111 mmol/L   CO2 28 22 - 32 mmol/L   Glucose, Bld 114 (H) 70 - 99 mg/dL    Comment: Glucose reference range applies only to samples taken after fasting for at least 8 hours.   BUN <5 (L) 8 - 23 mg/dL   Creatinine, Ser 0.32 (L) 0.44 - 1.00 mg/dL   Calcium 8.3 (L) 8.9 - 10.3 mg/dL   GFR calc non Af Amer >60 >60 mL/min   GFR calc Af Amer >60 >60 mL/min   Anion gap 10 5 - 15    Comment: Performed at [facility], [address] [place], [place] [address]  Magnesium     Status: None   Collection Time: 07/11/19  4:40 AM  Result Value Ref Range   Magnesium 2.2 1.7 - 2.4 mg/dL    Comment: Performed at [facility], [address] [place], [place] [address]  TSH     Status: None   Collection Time: 07/11/19  4:40 AM  Result Value Ref Range   TSH 1.186 0.350 - 4.500 uIU/mL    Comment: Performed by a 3rd Generation assay with a functional sensitivity of <=0.01 uIU/mL. Performed at [facility], [address] [place], [place] [address]   CBC     Status: Abnormal   Collection Time: 07/11/19  4:40 AM  Result Value Ref Range   WBC 7.0 4.0 - 10.5 K/uL   RBC 3.44 (L) 3.87 - 5.11 MIL/uL   Hemoglobin 10.7 (L) 12.0 - 15.0 g/dL   HCT 28.6 (L) 36.0 - 46.0 %   MCV 83.1 80.0 - 100.0 fL   MCH 31.1 26.0 - 34.0 pg   MCHC 37.4 (H) 30.0 - 36.0 g/dL    Comment: CORRECTED FOR COLD AGGLUTININS   RDW  14.0 11.5 - 15.5 %   Platelets 201 150 - 400 K/uL   nRBC 0.0 0.0 - 0.2 %    Comment: Performed at [facility], [address], [place], [place] [address]  Basic metabolic panel     Status: Abnormal   Collection Time: 07/11/19  7:30 AM  Result Value Ref Range   Sodium 126 (L) 135 - 145 mmol/L   Potassium 2.8 (L) 3.5 - 5.1 mmol/L   Chloride 89 (L) 98 - 111 mmol/L   CO2 29 22 - 32 mmol/L   Glucose, Bld 116 (H) 70 - 99 mg/dL    Comment: Glucose reference range applies only to samples taken after fasting for at least 8 hours.   BUN <5 (L) 8 - 23 mg/dL   Creatinine, Ser 0.35 (L) 0.44 - 1.00 mg/dL   Calcium 8.3 (L) 8.9 - 10.3 mg/dL   GFR calc non Af Amer >60 >60 mL/min   GFR calc Af Amer >60 >60 mL/min   Anion gap 8 5 - 15    Comment: Performed at [facility], [address] [place],  [address]  Glucose, capillary     Status: Abnormal   Collection Time: 07/11/19  8:28 AM  Result Value Ref Range   Glucose-Capillary 113 (H) 70 - 99 mg/dL    Comment: Glucose reference range applies only to samples  taken after fasting for at least 8 hours.  Basic metabolic panel     Status: Abnormal   Collection Time: 07/11/19 12:33 PM  Result Value Ref Range   Sodium 128 (L) 135 - 145 mmol/L   Potassium 3.2 (L) 3.5 - 5.1 mmol/L   Chloride 91 (L) 98 - 111 mmol/L   CO2 27 22 - 32 mmol/L   Glucose, Bld 119 (H) 70 - 99 mg/dL    Comment: Glucose reference range applies only to samples taken after fasting for at least 8 hours.   BUN <5 (L) 8 - 23 mg/dL   Creatinine, Ser 0.34 (L) 0.44 - 1.00 mg/dL   Calcium 8.4 (L) 8.9 - 10.3 mg/dL   GFR calc non Af Amer >60 >60 mL/min   GFR calc Af Amer >60 >60 mL/min   Anion gap 10 5 - 15    Comment: Performed at [facility], [address] [place], [place] [address]  Basic metabolic panel     Status: Abnormal   Collection Time: 07/11/19  2:34 PM  Result Value Ref Range   Sodium 126 (L) 135 - 145 mmol/L   Potassium 3.3 (L) 3.5 -  5.1 mmol/L   Chloride 89 (L) 98 - 111 mmol/L   CO2 28 22 - 32 mmol/L   Glucose, Bld 156 (H) 70 - 99 mg/dL    Comment: Glucose reference range applies only to samples taken after fasting for at least 8 hours.   BUN <5 (L) 8 - 23 mg/dL   Creatinine, Ser 0.52 0.44 - 1.00 mg/dL   Calcium 8.4 (L) 8.9 - 10.3 mg/dL   GFR calc non Af Amer >60 >60 mL/min   GFR calc Af Amer >60 >60 mL/min   Anion gap 9 5 - 15    Comment: Performed at [facility], [address] [place], [place] [address]  Glucose, capillary     Status: Abnormal   Collection Time: 07/11/19  4:38 PM  Result Value Ref Range   Glucose-Capillary 121 (H) 70 - 99 mg/dL    Comment: Glucose reference range applies only to samples taken after fasting for at least 8 hours.   Comment 1 Notify RN   Basic metabolic panel     Status: Abnormal   Collection Time: 07/11/19  7:14 PM  Result Value Ref Range   Sodium 126 (L) 135 - 145 mmol/L   Potassium 3.4 (L) 3.5 - 5.1 mmol/L   Chloride 90 (L) 98 - 111 mmol/L   CO2 27 22 - 32 mmol/L   Glucose, Bld 150 (H) 70 - 99 mg/dL    Comment: Glucose reference range applies only to samples taken after fasting for at least 8 hours.   BUN 6 (L) 8 - 23 mg/dL   Creatinine, Ser 0.48 0.44 - 1.00 mg/dL   Calcium 8.8 (L) 8.9 - 10.3 mg/dL   GFR calc non Af Amer >60 >60 mL/min   GFR calc Af Amer >60 >60 mL/min   Anion gap 9 5 - 15    Comment: Performed at [facility], [address] [place], [place] [address]  Glucose, capillary     Status: Abnormal   Collection Time: 07/11/19  9:49 PM  Result Value Ref Range   Glucose-Capillary 128 (H) 70 - 99 mg/dL    Comment: Glucose reference range applies only to samples taken after fasting for at least 8 hours.  Basic metabolic panel     Status: Abnormal   Collection  Time: 07/11/19 11:18 PM  Result Value Ref Range   Sodium 125 (L) 135 - 145 mmol/L   Potassium 3.9 3.5 - 5.1 mmol/L   Chloride 91 (L) 98 - 111 mmol/L   CO2 27 22 - 32 mmol/L    Glucose, Bld 122 (H) 70 - 99 mg/dL    Comment: Glucose reference range applies only to samples taken after fasting for at least 8 hours.   BUN 7 (L) 8 - 23 mg/dL   Creatinine, Ser 0.43 (L) 0.44 - 1.00 mg/dL   Calcium 8.6 (L) 8.9 - 10.3 mg/dL   GFR calc non Af Amer >60 >60 mL/min   GFR calc Af Amer >60 >60 mL/min   Anion gap 7 5 - 15    Comment: Performed at [facility], [address] [place], [place] [address]  Basic metabolic panel     Status: Abnormal   Collection Time: 07/12/19  3:47 AM  Result Value Ref Range   Sodium 126 (L) 135 - 145 mmol/L   Potassium 3.6 3.5 - 5.1 mmol/L   Chloride 92 (L) 98 - 111 mmol/L   CO2 27 22 - 32 mmol/L   Glucose, Bld 113 (H) 70 - 99 mg/dL    Comment: Glucose reference range applies only to samples taken after fasting for at least 8 hours.   BUN 6 (L) 8 - 23 mg/dL   Creatinine, Ser 0.41 (L) 0.44 - 1.00 mg/dL   Calcium 8.4 (L) 8.9 - 10.3 mg/dL   GFR calc non Af Amer >60 >60 mL/min   GFR calc Af Amer >60 >60 mL/min   Anion gap 7 5 - 15    Comment: Performed at [facility], [address] [place], [place] [address]  Hepatic function panel     Status: Abnormal   Collection Time: 07/12/19  3:47 AM  Result Value Ref Range   Total Protein 5.2 (L) 6.5 - 8.1 g/dL   Albumin 2.8 (L) 3.5 - 5.0 g/dL   AST 35 15 - 41 U/L   ALT 21 0 - 44 U/L   Alkaline Phosphatase 112 38 - 126 U/L   Total Bilirubin 1.7 (H) 0.3 - 1.2 mg/dL   Bilirubin, Direct 0.6 (H) 0.0 - 0.2 mg/dL   Indirect Bilirubin 1.1 (H) 0.3 - 0.9 mg/dL    Comment: Performed at [facility], [address] [place], [place] [address]  Protime-INR     Status: None   Collection Time: 07/12/19  3:47 AM  Result Value Ref Range   Prothrombin Time 14.4 11.4 - 15.2 seconds   INR 1.1 0.8 - 1.2    Comment: (NOTE) INR goal varies based on device and disease states. Performed at [facility], [address] [place], [place] [address]   Basic metabolic panel      Status: Abnormal   Collection Time: 07/12/19  8:06 AM  Result Value Ref Range   Sodium 126 (L) 135 - 145 mmol/L   Potassium 3.5 3.5 - 5.1 mmol/L   Chloride 94 (L) 98 - 111 mmol/L   CO2 24 22 - 32 mmol/L   Glucose, Bld 112 (H) 70 - 99 mg/dL    Comment: Glucose reference range applies only to samples taken after fasting for at least 8 hours.   BUN <5 (L) 8 - 23 mg/dL   Creatinine, Ser 0.32 (L) 0.44 - 1.00 mg/dL   Calcium 8.3 (L) 8.9 - 10.3 mg/dL   GFR calc non Af Amer >  60 >60 mL/min   GFR calc Af Amer >60 >60 mL/min   Anion gap 8 5 - 15    Comment: Performed at [facility], [address] [place], [place] [address]  Glucose, capillary     Status: Abnormal   Collection Time: 07/12/19  8:26 AM  Result Value Ref Range   Glucose-Capillary 104 (H) 70 - 99 mg/dL    Comment: Glucose reference range applies only to samples taken after fasting for at least 8 hours.   Comment 1 Notify RN    Comment 2 Document in Chart   Basic metabolic panel     Status: Abnormal   Collection Time: 07/12/19 11:24 AM  Result Value Ref Range   Sodium 126 (L) 135 - 145 mmol/L   Potassium 3.4 (L) 3.5 - 5.1 mmol/L   Chloride 92 (L) 98 - 111 mmol/L   CO2 26 22 - 32 mmol/L   Glucose, Bld 114 (H) 70 - 99 mg/dL    Comment: Glucose reference range applies only to samples taken after fasting for at least 8 hours.   BUN 5 (L) 8 - 23 mg/dL   Creatinine, Ser 0.47 0.44 - 1.00 mg/dL   Calcium 8.3 (L) 8.9 - 10.3 mg/dL   GFR calc non Af Amer >60 >60 mL/min   GFR calc Af Amer >60 >60 mL/min   Anion gap 8 5 - 15    Comment: Performed at [facility], [address] [place], [place] [address]  Glucose, capillary     Status: Abnormal   Collection Time: 07/12/19 11:57 AM  Result Value Ref Range   Glucose-Capillary 116 (H) 70 - 99 mg/dL    Comment: Glucose reference range applies only to samples taken after fasting for at least 8 hours.   Comment 1 Notify RN    Comment 2 Document in Chart   Basic  metabolic panel     Status: Abnormal   Collection Time: 07/12/19  3:24 PM  Result Value Ref Range   Sodium 127 (L) 135 - 145 mmol/L   Potassium 3.7 3.5 - 5.1 mmol/L   Chloride 93 (L) 98 - 111 mmol/L   CO2 26 22 - 32 mmol/L   Glucose, Bld 125 (H) 70 - 99 mg/dL    Comment: Glucose reference range applies only to samples taken after fasting for at least 8 hours.   BUN 5 (L) 8 - 23 mg/dL   Creatinine, Ser 0.42 (L) 0.44 - 1.00 mg/dL   Calcium 8.8 (L) 8.9 - 10.3 mg/dL   GFR calc non Af Amer >60 >60 mL/min   GFR calc Af Amer >60 >60 mL/min   Anion gap 8 5 - 15    Comment: Performed at [facility], [address] [place], [place] [address]    Blood Alcohol level:  No results found for: [facility]  Metabolic Disorder Labs: Lab Results  Component Value Date   HGBA1C 5.1 11/10/2018   MPG 99.67 11/10/2018   No results found for: PROLACTIN No results found for: CHOL, TRIG, HDL, CHOLHDL, VLDL, LDLCALC  Physical Findings: AIMS:  , ,  ,  ,    CIWA:    COWS:     Musculoskeletal: Strength & Muscle Tone: within normal limits Gait & Station: unable to stand Patient leans: N/A  Psychiatric Specialty Exam: Physical Exam  Review of Systems  Blood pressure 97/66, pulse 95, temperature 97.9 F (36.6 C), temperature source Oral, resp. rate 16, height 5' 5"  (1.651 m),  weight 89.8 kg, SpO2 97 %.Body mass index is 32.95 kg/m.  General Appearance: Casual  Eye Contact:  Good  Speech:  Clear and Coherent  Volume:  Normal  Mood:  Euthymic  Affect:  Appropriate  Thought Process:  Coherent  Orientation:  Other:  Person only  Thought Content:  Confused, Questioning why one was asking questions  Suicidal Thoughts:  No  Homicidal Thoughts:  No  Memory:  Minimal recall except for daughter's name  Judgement:  Poor  Insight:  Lacking  Psychomotor Activity:  Decreased  Concentration:  Concentration: Good  Recall:  NA  Fund of Knowledge:  Poor  Language:  Good  Akathisia:  No  Handed:     AIMS (if indicated):     Assets:  Others:  None obvoius  ADL's:  Impaired  Cognition:  Impaired,  Severe  Sleep:        Treatment Plan Summary: Pt is unable to care for self and apparently there are no family members who can assist. It would be logical to see guardianship to ensure her safety.  Alesia Morin, MD 07/12/2019, 4:52 PM

## 2019-07-12 NOTE — TOC Progression Note (Signed)
Transition of Care [facility]) - Progression Note    Patient Details  Name: [name] MRN: [id number] Date of Birth: 23-Feb-1956  Transition [facility]) CM/SW [place], RN Phone Number: 07/12/2019, 9:04 AM  Clinical Narrative:    Received a call from Darrick Meigs from [facility], she stated that DSS is attempting to take protective custody of the patient, The patient lives alone, she is alert to self only, She was found covered in bruises, the groceries that was taken to her a few days prior to DSS coming back had not been touched, she had blood all over her socks when she was found.  The APS worker stated that they would like the patient to go to long term care, I explained to her that the patient does not have the insurance to cover Long term care, I explained due to her having managed medicare that a short term rehab stay would have to be approved, I explained that often if a patient is unable to retain the information or follow directions from PT then the insurance will often deny stating that the patient is custodial, She requested a psych eval and conative eval to be completed.  I notified the physician of the request, Brayton Layman asked that before anyone discharges the patient for her to be called at [phone]        Expected Discharge Plan and Services                                                 Social Determinants of Health (SDOH) Interventions    Readmission Risk Interventions No flowsheet data found.

## 2019-07-12 NOTE — Evaluation (Signed)
Occupational Therapy Evaluation Patient Details Name: [name] MRN: [id number] DOB: 01-17-1956 Today's Date: 07/12/2019    History of Present Illness Pt admitted for acute metabolic encephalopathy with complaints of AMS. Other PMH includes liver cirrhosis, HTN, DM, gout, depression, IBS, fibromyaliga, CHF, and hernia.   Clinical Impression   [name] was seen for OT evaluation this date. Prior to hospital admission, pt was MOD I for ADLs and living alone. Pt presents to acute OT demonstrating impaired ADL performance, functional cognition, and functional mobility 2/2 decreased safety awareness, functional strength/balance/endurance deficits, and poor insight into deficits. Pt currently requires CGA don L sock and adjust R sock seated EOB. Anticipate CGA toileting and MIN A for peri care  / clothing management. Pt requires SETUP + VCs for self-drinking / medication management (RN to room to encourage pt to complete medications). Of note, when asked how many medications pt manages at home pt reported "about 10-12 but I put them all in a bag." Anticipate MAX A for medication management. Pt would benefit from skilled OT to address noted impairments and functional limitations (see below for any additional details) in order to maximize safety and independence while minimizing falls risk and caregiver burden. Upon hospital discharge, recommend STR to maximize pt safety and return to PLOF.     Follow Up Recommendations  SNF    Equipment Recommendations       Recommendations for Other Services       Precautions / Restrictions Precautions Precautions: Fall Restrictions Weight Bearing Restrictions: No      Mobility Bed Mobility Overal bed mobility: Needs Assistance Bed Mobility: Supine to Sit;Sit to Supine     Supine to sit: HOB elevated;Min guard Sit to supine: HOB elevated;Min guard      Transfers Overall transfer level: Needs assistance   Transfers: Sit to/from  Stand Sit to Stand: Min guard         General transfer comment: needs cues for sequencing. Poor safety awareness.    Balance Overall balance assessment: Needs assistance Sitting-balance support: Feet supported Sitting balance-Leahy Scale: Good Sitting balance - Comments: posterior lean for LB access to don/doff socks   Standing balance support: Single extremity supported Standing balance-Leahy Scale: Good Standing balance comment: 1 HHA for side steps at EOB                           ADL either performed or assessed with clinical judgement   ADL Overall ADL's : Needs assistance/impaired                                       General ADL Comments: CGA don L sock and adjust R sock seated EOB. Anticipate CGA toileting and MIN A for peri care  / clothing management. SETUP + VCs for self-drinking / medication management      Vision Baseline Vision/History: Wears glasses Wears Glasses: At all times       Perception     Praxis      Pertinent Vitals/Pain Pain Assessment: Faces Faces Pain Scale: Hurts a little bit Pain Location: B LEs Pain Descriptors / Indicators: Discomfort Pain Intervention(s): Limited activity within patient's tolerance;Repositioned     Hand Dominance     Extremity/Trunk Assessment Upper Extremity Assessment Upper Extremity Assessment: Generalized weakness   Lower Extremity Assessment Lower Extremity Assessment: Generalized weakness  Communication Communication Communication: No difficulties   Cognition Arousal/Alertness: Awake/alert Behavior During Therapy: WFL for tasks assessed/performed Overall Cognitive Status: Impaired/Different from baseline Area of Impairment: Orientation;Memory;Safety/judgement;Problem solving;Following commands                 Orientation Level: Disoriented to;Time;Situation   Memory: Decreased recall of precautions;Decreased short-term memory Following Commands: Follows one  step commands with increased time Safety/Judgement: Decreased awareness of safety;Decreased awareness of deficits   Problem Solving: Slow processing;Difficulty sequencing;Requires verbal cues General Comments: Pt unable to state month despite logical and categorical cues given   General Comments  R toenail visualized and appears red     Exercises Exercises: Other exercises Other Exercises Other Exercises: sup<>sit, sit<>stand, sitting/standing balance/tolerance, don/doff L sock, self-drinking,  Other Exercises: Pt educated re: call bell, d/c recommendations, importance of supervision for OOB mobility, medicatio management strategies, home/routines modifications, falls prevention   Shoulder Instructions      Home Living Family/patient expects to be discharged to:: Private residence Living Arrangements: Alone   Type of Home: House Home Access: Stairs to enter [employer] of Steps: 12 Entrance Stairs-Rails: Can reach both Home Layout: Two level     Bathroom Shower/Tub: Teacher, early years/pre: Standard     Home Equipment: Environmental consultant - 2 wheels;Bedside commode   Additional Comments: Pt very confused, unsure of accurate history      Prior Functioning/Environment Level of Independence: Independent with assistive device(s)        Comments: Pt reports using RW for all mobility at baseline. Reports no falls, however noted scabbing/bruising on B LEs. Wounds noted on B feet. Poor historian        OT Problem List: Decreased strength;Decreased activity tolerance;Impaired balance (sitting and/or standing);Decreased cognition;Decreased safety awareness;Decreased knowledge of use of DME or AE      OT Treatment/Interventions: Self-care/ADL training;Therapeutic exercise;Energy conservation;DME and/or AE instruction;Therapeutic activities;Patient/family education;Balance training    OT Goals(Current goals can be found in the care plan section) Acute Rehab OT  Goals Patient Stated Goal: wants to go home OT Goal Formulation: With patient Time For Goal Achievement: 07/26/19 Potential to Achieve Goals: Fair ADL Goals Pt Will Perform Grooming: standing;with modified independence(c no cues for sequencing) Pt Will Perform Lower Body Dressing: with modified independence;sit to/from stand(c LRAD PRN) Additional ADL Goal #1: Pt will independently verbalize plan x3 to implement strategies for falls prevention  OT Frequency: Min 1X/week   Barriers to D/C: Inaccessible home environment;Decreased caregiver support          Co-evaluation              AM-PAC OT "6 Clicks" Daily Activity     Outcome Measure Help from another person eating meals?: A Little Help from another person taking care of personal grooming?: A Little Help from another person toileting, which includes using toliet, bedpan, or urinal?: A Little Help from another person bathing (including washing, rinsing, drying)?: A Little Help from another person to put on and taking off regular upper body clothing?: A Little Help from another person to put on and taking off regular lower body clothing?: A Little 6 Click Score: 18   End of Session    Activity Tolerance: Patient tolerated treatment well;Other (comment)(Tx limited by cognition) Patient left: in bed;with call bell/phone within reach  OT Visit Diagnosis: Unsteadiness on feet (R26.81);Other abnormalities of gait and mobility (R26.89)                Time: 9833-8250 OT Time Calculation (min):  25 min Charges:  OT General Charges $OT Visit: 1 Visit OT Evaluation $OT Eval Moderate Complexity: 1 Mod OT Treatments $Self Care/Home Management : 8-22 mins  Dessie Coma, M.S. OTR/L  07/12/19, 4:29 PM

## 2019-07-12 NOTE — Progress Notes (Signed)
PROGRESS NOTE    [name]  MCN:[id number] DOB: 30-Jun-1955 DOA: 07/10/2019 [facility]CP: [facility]   Brief Narrative:  [name] is a 64 y.o. female with medical history significant of liver cirrhosis, hypertension, hyperlipidemia, diabetes mellitus, gout, depression, IBS, fibromyalgia, dCHF, hernia, who presents with altered mental status. Pre report, pt lives alone at home. She was noted to be confused, and is not able to take care of herself. She is not taking her meds, not eating and drinking well. Patient actually comes under IVC due to confusion. Per report, pt has soiled clothing. When I saw pt in ED, she is confused.  She knows her own name, and knows that she is in hospital, but is not orientated to the time.  Has had recent admissions for hyponatremia and UTI. Found to have sodium of 114, started on 3% normal saline in ED, which was discontinued overnight due to overcorrection.  Subjective: Patient is alert but oriented to self only.  No new complaints.  Assessment & Plan:   Principal Problem:   Acute metabolic encephalopathy Active Problems:   Diabetes mellitus without complication (HCC)   Hyponatremia   Liver cirrhosis (HCC)   Hypertension   Chronic diastolic CHF (congestive heart failure) (HCC)   Hypokalemia  Hyponatremia.  Most likely secondary to poor p.o. intake.  Hyponatremia labs are more consistent with hypovolemic hypotonic hyponatremia.  She was initially treated with 3% hypertonic saline resulted overcorrection of sodium to 128. Hypertonic saline was discontinued and her sodium dropped to 126 without any further intervention. -Nephrology was consulted-appreciate their recommendations. -They started her on salt tablets. -Continue to monitor  Hypokalemia.  Resolved. -Repleat electrolytes as needed and monitor.  Acute metabolic encephalopathy.  Most likely due to hyponatremia.  CT head negative.  Ammonia level within normal limit at  28.  UA negative. Unknown baseline.  Patient apparently lives alone and some family member found her on floor and called the EMS.  Call daughter with no response. Patient told me that she do not have any kids, 1 daughter listed in her chart. Apparently Adult Protective Services are involved and wants to take over this patient's custody.  Daughter does not want to be involved.  They were asking for psych evaluation. -Evaluation ordered. - PT/OT -commending SNF placement  Diabetes mellitus without complication ([place]): Most recent A1c 5.1, well controled. Patient is taking Metformin at home -[facility]  Liver cirrhosis [facility]): Ammonia level normal 28. INR 1.1 -continue home lactulose  HTN:  Pt is not on Bp meds.  Blood pressure within goal. -monitoring Bp closely  Chronic diastolic CHF (congestive heart failure) ([place]): 2D echo on 08/16/2015 showed EF > 55%.  Patient has a trace leg edema.  No respiratory distress.  No pulmonary edema chest x-ray.  CHF symptoms compensated.  Patient is not taking diuretics currently. -check BN- 114  Foot ulcer: pt has a ulcer in right great toe, a very small lesion in left 2nd tod, and a scabbed lesion in the left plantar area. No active draining. -wound care consult.   Objective: Vitals:   07/11/19 2227 07/12/19 0618 07/12/19 0834 07/12/19 0844  BP: 93/77   97/66  Pulse: 94  99 95  Resp:    16  Temp: 97.8 F (36.6 C)   97.9 F (36.6 C)  TempSrc: Oral   Oral  SpO2: 96%   97%  Weight:  89.8 kg    Height:        Intake/Output Summary (Last  24 hours) at 07/12/2019 1545 Last data filed at 07/12/2019 1100 Gross per 24 hour  Intake 736.62 ml  Output 300 ml  Net 436.62 ml   Filed Weights   07/10/19 0923 07/12/19 0618  Weight: 105.5 kg 89.8 kg    Examination:  General exam: Appears calm and comfortable  Respiratory system: Clear to auscultation. Respiratory effort normal. Cardiovascular system: S1 & S2 heard, RRR. No JVD, murmurs, rubs, gallops or  clicks. Gastrointestinal system: Soft, nontender, nondistended, bowel sounds positive. Central nervous system: Alert and oriented to self and place not to time.  No focal neurological deficits. Extremities: No edema, no cyanosis, pulses intact and symmetrical. Psychiatry: Judgement and insight appear impaired.   DVT prophylaxis: Lovenox Code Status: Full Family Communication: Daughter does not want to be involved.  Adult Protective Services are trying to take over custody. Disposition Plan:  Status is: Inpatient  Remains inpatient appropriate because:Persistent severe electrolyte disturbances   Dispo: The patient is from: Home              Anticipated d/c is to: SNF              Anticipated d/c date is: 2 days              Patient currently is not medically stable to d/c.  Adult Protective Services are involved and they are trying to get the custody.  Pending psych evaluation.  Consultants:  Nephrology Psychiatry  Procedures:  Antimicrobials:   Data Reviewed: I have personally reviewed following labs and imaging studies  CBC: Recent Labs  Lab 07/10/19 1034 07/11/19 0440  WBC 8.7 7.0  NEUTROABS 7.0  --   HGB 11.1* 10.7*  HCT RESULTS UNAVAILABLE DUE TO INTERFERING SUBSTANCE 28.6*  MCV RESULTS UNAVAILABLE DUE TO INTERFERING SUBSTANCE 83.1  PLT 217 735   Basic Metabolic Panel: Recent Labs  Lab 07/10/19 1034 07/10/19 1747 07/11/19 0440 07/11/19 0730 07/11/19 1914 07/11/19 2318 07/12/19 0347 07/12/19 0806 07/12/19 1124  NA 114*  114*   < > 128*   < > 126* 125* 126* 126* 126*  K 2.6*  --  2.7*   < > 3.4* 3.9 3.6 3.5 3.4*  CL 73*  --  90*   < > 90* 91* 92* 94* 92*  CO2 29  --  28   < > 27 27 27 24 26   GLUCOSE 137*  --  114*   < > 150* 122* 113* 112* 114*  BUN <5*  --  <5*   < > 6* 7* 6* <5* 5*  CREATININE 0.34*  --  0.32*   < > 0.48 0.43* 0.41* 0.32* 0.47  CALCIUM 8.3*  --  8.3*   < > 8.8* 8.6* 8.4* 8.3* 8.3*  MG 1.7  --  2.2  --   --   --   --   --   --    < >  = values in this interval not displayed.   GFR: Estimated Creatinine Clearance: 79.7 mL/min (by C-G formula based on SCr of 0.47 mg/dL). Liver Function Tests: Recent Labs  Lab 07/10/19 1034 07/12/19 0347  AST 54* 35  ALT 26 21  ALKPHOS 133* 112  BILITOT 2.8* 1.7*  PROT 6.0* 5.2*  ALBUMIN 3.2* 2.8*   No results for input(s): LIPASE, AMYLASE in the last 168 hours. Recent Labs  Lab 07/10/19 1034  AMMONIA 28   Coagulation Profile: Recent Labs  Lab 07/10/19 1034 07/12/19 0347  INR 1.1 1.1   Cardiac  Enzymes: No results for input(s): CKTOTAL, CKMB, CKMBINDEX, TROPONINI in the last 168 hours. BNP (last 3 results) No results for input(s): PROBNP in the last 8760 hours. HbA1C: No results for input(s): HGBA1C in the last 72 hours. CBG: Recent Labs  Lab 07/11/19 0828 07/11/19 1638 07/11/19 2149 07/12/19 0826 07/12/19 1157  GLUCAP 113* 121* 128* 104* 116*   Lipid Profile: No results for input(s): CHOL, HDL, LDLCALC, TRIG, CHOLHDL, LDLDIRECT in the last 72 hours. Thyroid Function Tests: Recent Labs    07/11/19 0440  TSH 1.186   Anemia Panel: No results for input(s): VITAMINB12, FOLATE, FERRITIN, TIBC, IRON, RETICCTPCT in the last 72 hours. Sepsis Labs: No results for input(s): PROCALCITON, LATICACIDVEN in the last 168 hours.  Recent Results (from the past 240 hour(s))  SARS CORONAVIRUS 2 (TAT 6-24 HRS) Nasopharyngeal Nasopharyngeal Swab     Status: None   Collection Time: 07/10/19 10:34 AM   Specimen: Nasopharyngeal Swab  Result Value Ref Range Status   SARS Coronavirus 2 NEGATIVE NEGATIVE Final    Comment: (NOTE) SARS-CoV-2 target nucleic acids are NOT DETECTED. The SARS-CoV-2 RNA is generally detectable in upper and lower respiratory specimens during the acute phase of infection. Negative results do not preclude SARS-CoV-2 infection, do not rule out co-infections with other pathogens, and should not be used as the sole basis for treatment or other patient  management decisions. Negative results must be combined with clinical observations, patient history, and epidemiological information. The expected result is Negative. Fact Sheet for Patients: SugarRoll.be Fact Sheet for Healthcare Providers: https://www.woods-mathews.com/ This test is not yet approved or cleared by the [place] FDA and  has been authorized for detection and/or diagnosis of SARS-CoV-2 by FDA under an Emergency Use Authorization (EUA). This EUA will remain  in effect (meaning this test can be used) for the duration of the COVID-19 declaration under Section 56 4(b)(1) of the Act, 21 U.S.C. section 360bbb-3(b)(1), unless the authorization is terminated or revoked sooner. Performed at [facility], [place] [address], [place], [place] [address]      Radiology Studies: No results found.  Scheduled Meds: . cholecalciferol  500 Units Oral Daily  . enoxaparin (LOVENOX) injection  40 mg Subcutaneous Q24H  . insulin aspart  0-5 Units Subcutaneous QHS  . insulin aspart  0-9 Units Subcutaneous TID WC  . lactulose  30 g Oral TID  . methocarbamol  750 mg Oral TID  . sodium chloride  1 g Oral BID WC  . torsemide  5 mg Oral Daily   Continuous Infusions:   LOS: 2 days   Time spent: 45 minutes.  Lorella Nimrod, MD [facility]  If 7PM-7AM, please contact night-coverage [email or web address]  07/12/2019, 3:45 PM   This record has been created using Systems analyst. Errors have been sought and corrected,but may not always be located. Such creation errors do not reflect on the standard of care.

## 2019-07-12 NOTE — Evaluation (Signed)
Physical Therapy Evaluation Patient Details Name: [name] MRN: [id number] DOB: 1955-09-18 Today's Date: 07/12/2019   History of Present Illness  Pt admitted for acute metabolic encephalopathy with complaints of AMS. Other PMH includes liver cirrhosis, HTN, DM, gout, depression, IBS, fibromyaliga, CHF, and hernia.  Clinical Impression  Pt is a pleasant 64 year old female who was admitted for acute metabolic encephalopathy. Pt performs bed mobility with min assist, transfers with cga, and ambulation with cga and RW. Pt demonstrates deficits with strength/cognition/safety. Currently not safe to live alone, APS involved. Ideally would benefit from supervised care once strength improves. Needs to use RW at all times, which she reports she forgets. Pt reports no falls, however she is a poor historian. Able to retain information such as call bell, however needs extensive education. Appears motivated to participate. Would benefit from skilled PT to address above deficits and promote optimal return to PLOF; recommend transition to STR upon discharge from acute hospitalization.     Follow Up Recommendations SNF    Equipment Recommendations  None recommended by PT    Recommendations for Other Services       Precautions / Restrictions Precautions Precautions: Fall Restrictions Weight Bearing Restrictions: No      Mobility  Bed Mobility Overal bed mobility: Needs Assistance Bed Mobility: Supine to Sit     Supine to sit: Min assist     General bed mobility comments: slight difficulty, but able to manage up to EOB. Once seated, able to sit with upright posture  Transfers Overall transfer level: Needs assistance Equipment used: Rolling walker (2 wheeled) Transfers: Sit to/from Stand Sit to Stand: Min guard         General transfer comment: needs cues for sequencing. Poor safety awareness.  Ambulation/Gait Ambulation/Gait assistance: Min guard Gait Distance (Feet): 40  Feet Assistive device: Rolling walker (2 wheeled) Gait Pattern/deviations: Step-to pattern     General Gait Details: ambulated in room, fatigues quickly. Poor safety awareness with RW.  Stairs            Wheelchair Mobility    Modified Rankin (Stroke Patients Only)       Balance Overall balance assessment: Needs assistance Sitting-balance support: Feet supported Sitting balance-Leahy Scale: Good     Standing balance support: Bilateral upper extremity supported Standing balance-Leahy Scale: Good Standing balance comment: using RW                             Pertinent Vitals/Pain Pain Assessment: Faces Faces Pain Scale: Hurts a little bit Pain Location: B LEs Pain Descriptors / Indicators: Discomfort(stiffness) Pain Intervention(s): Limited activity within patient's tolerance;Repositioned    Home Living Family/patient expects to be discharged to:: Private residence Living Arrangements: Alone   Type of Home: House Home Access: Stairs to enter Entrance Stairs-Rails: Can reach both Entrance Stairs-Number of Steps: 12 Home Layout: Two level Home Equipment: Environmental consultant - 2 wheels;Bedside commode Additional Comments: Pt very confused, unsure of accurate history    Prior Function Level of Independence: Independent with assistive device(s)         Comments: Pt reports using RW for all mobility at baseline. Reports no falls, however noted scabbing/bruising on B LEs. Wounds noted on B feet. Poor historian     Hand Dominance        Extremity/Trunk Assessment   Upper Extremity Assessment Upper Extremity Assessment: Generalized weakness(B UE grossly 4/5)    Lower Extremity Assessment Lower Extremity Assessment: Generalized  weakness(B LE grossly 3+/5)       Communication   Communication: No difficulties  Cognition Arousal/Alertness: Awake/alert Behavior During Therapy: WFL for tasks assessed/performed Overall Cognitive Status: Impaired/Different  from baseline Area of Impairment: Orientation;Memory;Safety/judgement;Problem solving                               General Comments: educated on call bell, limited recall of call bell function. Unsure of year.      General Comments      Exercises Other Exercises Other Exercises: supine ther-ex performed on B LE including AP, quad sets, SLRs, hip add squeezes, and hip abd/add. All ther-ex performed x 10 reps with cga   Assessment/Plan    PT Assessment Patient needs continued PT services  PT Problem List Decreased strength;Decreased activity tolerance;Decreased balance;Decreased mobility;Decreased cognition;Decreased safety awareness       PT Treatment Interventions DME instruction;Gait training;Therapeutic exercise;Balance training    PT Goals (Current goals can be found in the Care Plan section)  Acute Rehab PT Goals Patient Stated Goal: wants to go home PT Goal Formulation: With patient Time For Goal Achievement: 07/26/19 Potential to Achieve Goals: Good    Frequency Min 2X/week   Barriers to discharge Decreased caregiver support      Co-evaluation               AM-PAC PT "6 Clicks" Mobility  Outcome Measure Help needed turning from your back to your side while in a flat bed without using bedrails?: None Help needed moving from lying on your back to sitting on the side of a flat bed without using bedrails?: A Little Help needed moving to and from a bed to a chair (including a wheelchair)?: A Little Help needed standing up from a chair using your arms (e.g., wheelchair or bedside chair)?: A Little Help needed to walk in hospital room?: A Little Help needed climbing 3-5 steps with a railing? : A Little 6 Click Score: 19    End of Session Equipment Utilized During Treatment: Gait belt Activity Tolerance: Patient tolerated treatment well Patient left: in chair;with chair alarm set Nurse Communication: Mobility status PT Visit Diagnosis:  Unsteadiness on feet (R26.81);Muscle weakness (generalized) (M62.81);History of falling (Z91.81);Difficulty in walking, not elsewhere classified (R26.2)    Time: 9532-0233 PT Time Calculation (min) (ACUTE ONLY): 23 min   Charges:   PT Evaluation $PT Eval Low Complexity: 1 Low PT Treatments $Therapeutic Exercise: 8-22 mins        Greggory Stallion, PT, DPT [phone]   [name] 07/12/2019, 12:02 PM

## 2019-07-12 NOTE — Progress Notes (Signed)
[address] [place], [place] [address] Phone [phone]. Fax [phone]  Date: 07/12/2019                  Patient Name:  [name]  MRN: [id number]  DOB: October 04, 1955  Age / Sex: 64 y.o., female         PCP: [facility] [place]                 Service Requesting Consult: IM/ Lorella Nimrod, MD                 Reason for Consult: Hyponatremia            History of Present Illness: Patient is a 64 y.o. female with multiple medical problems, who was admitted to [facility] on 07/10/2019 for evaluation of confusion. Patient not able to provide meaningful information. Per ER notes, Patient's family called EMS due to her being unable to care for herself.  She lives alone.  She was found on the floor by EMS. Found to have critically low sodium at admission of 114 Last known Na 132 in 10/2018   Today, she feels fair.  Does not remember much of the events of the last few days.   Able to eat today.  Denies nausea or vomiting Sodium has improved to 126   Current medications: Current Facility-Administered Medications  Medication Dose Route Frequency Provider Last Rate Last Admin  . cholecalciferol (VITAMIN D3) tablet 500 Units  500 Units Oral Daily Ivor Costa, MD   500 Units at 07/12/19 [phone]  . enoxaparin (LOVENOX) injection 40 mg  40 mg Subcutaneous Q24H Ivor Costa, MD   40 mg at 07/12/19 0532  . insulin aspart (novoLOG) injection 0-5 Units  0-5 Units Subcutaneous QHS Ivor Costa, MD      . insulin aspart (novoLOG) injection 0-9 Units  0-9 Units Subcutaneous TID WC Ivor Costa, MD   1 Units at 07/11/19 1708  . lactulose (CHRONULAC) 10 GM/15ML solution 30 g  30 g Oral TID Ivor Costa, MD   30 g at 07/12/19 0929  . methocarbamol (ROBAXIN) tablet 750 mg  750 mg Oral TID Ivor Costa, MD   750 mg at 07/12/19 0928  . ondansetron (ZOFRAN) injection 4 mg  4 mg Intravenous Q8H PRN Ivor Costa, MD      . sodium chloride tablet 1 g  1 g Oral BID WC Murlean Iba, MD   1 g at  07/12/19 0926      Allergies: Allergies  Allergen Reactions  . Vicodin [Hydrocodone-Acetaminophen] Other (See Comments) and Rash    nightmares nightmares Per patient she crawls all over the bed     Vital Signs: Blood pressure 97/66, pulse 95, temperature 97.9 F (36.6 C), temperature source Oral, resp. rate 16, height 5' 5"  (1.651 m), weight 89.8 kg, SpO2 97 %.   Intake/Output Summary (Last 24 hours) at 07/12/2019 1439 Last data filed at 07/12/2019 1100 Gross per 24 hour  Intake 736.62 ml  Output 300 ml  Net 436.62 ml    Weight trends: [employer]   07/10/19 4970 07/12/19 0618  Weight: 105.5 kg 89.8 kg    Physical Exam: General:  NAD, sitting up in the bed  HEENT  moist oral mucous membranes  Neck:  Supple, no masses  Lungs:  Clear to auscultation  Heart::  Irregular rhythm  Abdomen:  Soft, nontender  Extremities:  no edema  Neurologic: Alert, oriented to self and place.  Knows her birthday.  Skin: Bruise on left forearm    Lab results: Basic Metabolic Panel: Recent Labs  Lab 07/10/19 1034 07/10/19 1747 07/11/19 0440 07/11/19 0730 07/12/19 0347 07/12/19 0806 07/12/19 1124  NA 114*  114*   < > 128*   < > 126* 126* 126*  K 2.6*  --  2.7*   < > 3.6 3.5 3.4*  CL 73*  --  90*   < > 92* 94* 92*  CO2 29  --  28   < > 27 24 26   GLUCOSE 137*  --  114*   < > 113* 112* 114*  BUN <5*  --  <5*   < > 6* <5* 5*  CREATININE 0.34*  --  0.32*   < > 0.41* 0.32* 0.47  CALCIUM 8.3*  --  8.3*   < > 8.4* 8.3* 8.3*  MG 1.7  --  2.2  --   --   --   --    < > = values in this interval not displayed.    Liver Function Tests: Recent Labs  Lab 07/12/19 0347  AST 35  ALT 21  ALKPHOS 112  BILITOT 1.7*  PROT 5.2*  ALBUMIN 2.8*   No results for input(s): LIPASE, AMYLASE in the last 168 hours. Recent Labs  Lab 07/10/19 1034  AMMONIA 28    CBC: Recent Labs  Lab 07/10/19 1034 07/11/19 0440  WBC 8.7 7.0  NEUTROABS 7.0  --   HGB 11.1* 10.7*  HCT RESULTS  UNAVAILABLE DUE TO INTERFERING SUBSTANCE 28.6*  MCV RESULTS UNAVAILABLE DUE TO INTERFERING SUBSTANCE 83.1  PLT 217 201    Cardiac Enzymes: No results for input(s): CKTOTAL, TROPONINI in the last 168 hours.  BNP: Invalid input(s): POCBNP  CBG: Recent Labs  Lab 07/11/19 0828 07/11/19 1638 07/11/19 2149 07/12/19 0826 07/12/19 1157  GLUCAP 113* 121* 128* 104* 116*    Microbiology: Recent Results (from the past 720 hour(s))  SARS CORONAVIRUS 2 (TAT 6-24 HRS) Nasopharyngeal Nasopharyngeal Swab     Status: None   Collection Time: 07/10/19 10:34 AM   Specimen: Nasopharyngeal Swab  Result Value Ref Range Status   SARS Coronavirus 2 NEGATIVE NEGATIVE Final    Comment: (NOTE) SARS-CoV-2 target nucleic acids are NOT DETECTED. The SARS-CoV-2 RNA is generally detectable in upper and lower respiratory specimens during the acute phase of infection. Negative results do not preclude SARS-CoV-2 infection, do not rule out co-infections with other pathogens, and should not be used as the sole basis for treatment or other patient management decisions. Negative results must be combined with clinical observations, patient history, and epidemiological information. The expected result is Negative. Fact Sheet for Patients: SugarRoll.be Fact Sheet for Healthcare Providers: https://www.woods-mathews.com/ This test is not yet approved or cleared by the [place] FDA and  has been authorized for detection and/or diagnosis of SARS-CoV-2 by FDA under an Emergency Use Authorization (EUA). This EUA will remain  in effect (meaning this test can be used) for the duration of the COVID-19 declaration under Section 56 4(b)(1) of the Act, 21 U.S.C. section 360bbb-3(b)(1), unless the authorization is terminated or revoked sooner. Performed at [facility], [place] [address] [place], [place] [address]      Coagulation Studies: Recent Labs     07/10/19 1034 07/12/19 0347  LABPROT 13.6 14.4  INR 1.1 1.1    Urinalysis: Recent Labs    07/10/19 1034  COLORURINE YELLOW*  LABSPEC 1.002*  PHURINE 6.0  GLUCOSEU NEGATIVE  HGBUR NEGATIVE  BILIRUBINUR NEGATIVE  KETONESUR 5*  PROTEINUR NEGATIVE  NITRITE NEGATIVE  LEUKOCYTESUR SMALL*        Imaging: No results found.   Assessment & Plan: Pt is a 64 y.o.  female with congestive heart failure, depression, diabetes, fibromyalgia, gout, hyperlipidemia, hypertension, cirrhosis, was admitted on 07/10/2019 with Acute hyponatremia [E87.1] Hyponatremia [E87.1] Acute encephalopathy [J12.16] Acute metabolic encephalopathy [K44.69]  #Hyponatremia, symptomatic with confusion Last known sodium of 132 in August 2020 suggesting chronic hyponatremia Comes in with acutely low level of 114.  Baseline mental status is unknown but appears to be confused at presentation.  Today, mental status is better compared to yesterday.   Chronic hyponatremia is likely secondary to liver disease, with acute worsening from dehydration.  TSH normal at 1.186  Patient was treated with 3% saline initially.  It was discontinued when sodium increased from 114->128 Since then sodium level has been stable between 126-128 Average rate of correction is acceptable Encourage oral intake of food or Ensure Patient has been started on sodium chloride tablets 1 g twice a day.  Add low-dose loop diuretic Continue to monitor sodium closely.  #Hypokalemia Likely nutritional Expected to improve with better nutrition   LOS: 2 Maryln Eastham 4/12/20212:39 PM    Note: This note was prepared with Dragon dictation. Any transcription errors are unintentional

## 2019-07-12 NOTE — Plan of Care (Signed)
  Problem: Education: Goal: Knowledge of General Education information will improve Description: Including pain rating scale, medication(s)/side effects and non-pharmacologic comfort measures Outcome: Progressing   Problem: Health Behavior/Discharge Planning: Goal: Ability to manage health-related needs will improve Outcome: Progressing   Problem: Nutrition: Goal: Adequate nutrition will be maintained Outcome: Progressing   Problem: Coping: Goal: Level of anxiety will decrease Outcome: Progressing Note: No s/s of anxiety noted this shift

## 2019-07-12 NOTE — Consult Note (Signed)
[facility] [place] Nurse Consult Note: Reason for Consult: foot ulcerations Patient with DM and known neuropathy Wound type: Neuropathic full thickness ulceration: right great toe base, medial Neuropathic full thickness ulceration: left medial mid foot, plantar surface  Pressure Injury POA: NA Measurement: Right great toe: opening is 0.3cm x 0.3cm x 0.1cm  Left medial foot: 1.5cm x 0.5cm x 0.1cm  Wound bed: Right great toe; callous hypertrophic area along the medial/distal aspect of the wound bed which is 100% pale and pink Left medial foot; soft darkened tissue but not eschar; 100%  Drainage (amount, consistency, odor) minimal from each site, serosanguinous, no odor Periwound: intact, foot deformities noted bilaterally  Dressing procedure/placement/frequency: Stable neuropathic foot ulcerations; no s/s of infection Unclear of her current status on offloading or use of orthotics Paint with betadine and allow to air dry to keep stable, cover with silicone foam   Follow up recommended with podiatrist of her choice as outpatient. Patient acknowledged need.   Discussed POC with patient and bedside nurse.  Re consult if needed, will not follow at this time. Thanks  Ayan Heffington [employer], RN,CWOCN, CNS, [place] [phone])

## 2019-07-13 LAB — CBC
HCT: 29.4 % — ABNORMAL LOW (ref 36.0–46.0)
Hemoglobin: 10.3 g/dL — ABNORMAL LOW (ref 12.0–15.0)
MCH: 31.6 pg (ref 26.0–34.0)
MCHC: 35 g/dL (ref 30.0–36.0)
MCV: 90.2 fL (ref 80.0–100.0)
Platelets: 194 10*3/uL (ref 150–400)
RBC: 3.26 MIL/uL — ABNORMAL LOW (ref 3.87–5.11)
RDW: 14.6 % (ref 11.5–15.5)
WBC: 7.7 10*3/uL (ref 4.0–10.5)
nRBC: 0 % (ref 0.0–0.2)

## 2019-07-13 LAB — GLUCOSE, CAPILLARY
Glucose-Capillary: 114 mg/dL — ABNORMAL HIGH (ref 70–99)
Glucose-Capillary: 114 mg/dL — ABNORMAL HIGH (ref 70–99)
Glucose-Capillary: 106 mg/dL — ABNORMAL HIGH (ref 70–99)
Glucose-Capillary: 101 mg/dL — ABNORMAL HIGH (ref 70–99)

## 2019-07-13 LAB — BASIC METABOLIC PANEL
Anion gap: 9 (ref 5–15)
BUN: <5 mg/dL — ABNORMAL LOW (ref 8–23)
CO2: 27 mmol/L (ref 22–32)
Calcium: 8.5 mg/dL — ABNORMAL LOW (ref 8.9–10.3)
Chloride: 93 mmol/L — ABNORMAL LOW (ref 98–111)
Creatinine, Ser: 0.31 mg/dL — ABNORMAL LOW (ref 0.44–1.00)
GFR calc Af Amer: >60 mL/min (ref >60)
GFR calc non Af Amer: >60 mL/min (ref >60)
Glucose, Bld: 117 mg/dL — ABNORMAL HIGH (ref 70–99)
Potassium: 2.9 mmol/L — ABNORMAL LOW (ref 3.5–5.1)
Sodium: 129 mmol/L — ABNORMAL LOW (ref 135–145)

## 2019-07-13 LAB — MAGNESIUM
Magnesium: 1.7 mg/dL (ref 1.7–2.4)
Magnesium: 2.6 mg/dL — ABNORMAL HIGH (ref 1.7–2.4)

## 2019-07-13 MED ORDER — POTASSIUM CHLORIDE CRYS ER 20 MEQ PO TBCR
40.0000 meq | EXTENDED_RELEASE_TABLET | ORAL | Status: AC
  Administered 2019-07-13 (×3): 40 meq via ORAL
  Filled 2019-07-13 (×3): qty 2

## 2019-07-13 MED ORDER — POTASSIUM CHLORIDE 10 MEQ/100ML IV SOLN
10.0000 meq | INTRAVENOUS | Status: DC
  Administered 2019-07-13: 10 meq via INTRAVENOUS
  Filled 2019-07-13: qty 100

## 2019-07-13 MED ORDER — MAGNESIUM SULFATE 2 GM/50ML IV SOLN
2.0000 g | Freq: Once | INTRAVENOUS | Status: AC
  Administered 2019-07-13: 2 g via INTRAVENOUS
  Filled 2019-07-13: qty 50

## 2019-07-13 MED ORDER — IBUPROFEN 400 MG PO TABS
400.0000 mg | ORAL_TABLET | Freq: Four times a day (QID) | ORAL | Status: DC | PRN
  Administered 2019-07-13: 400 mg via ORAL
  Filled 2019-07-13: qty 1

## 2019-07-13 NOTE — Progress Notes (Signed)
PROGRESS NOTE    [name]  YBO:[id number] DOB: [birth year] DOA: 07/10/2019 PCP: [facility] [place]   Brief Narrative:  [name] is a 64 y.o. female with medical history significant of liver cirrhosis, hypertension, hyperlipidemia, diabetes mellitus, gout, depression, IBS, fibromyalgia, dCHF, hernia, who presents with altered mental status. Pre report, pt lives alone at home. She was noted to be confused, and is not able to take care of herself. She is not taking her meds, not eating and drinking well. Patient actually comes under IVC due to confusion. Per report, pt has soiled clothing. When I saw pt in ED, she is confused.  She knows her own name, and knows that she is in hospital, but is not orientated to the time.  Has had recent admissions for hyponatremia and UTI. Found to have sodium of 114, started on 3% normal saline in ED, which was discontinued overnight due to overcorrection.  Subjective: Patient is alert but oriented to self only.  When asked about the year she told me that this is 54.  Very pleasant and keep stating that he wants to go home, I am to explain her multiple times that she needs to go to rehab to get stronger before home as she is unable to take care of herself.  Assessment & Plan:   Principal Problem:   Acute metabolic encephalopathy Active Problems:   Diabetes mellitus without complication (HCC)   Hyponatremia   Liver cirrhosis (HCC)   Hypertension   Chronic diastolic CHF (congestive heart failure) (HCC)   Hypokalemia  Hyponatremia.  Improved to 129 today.  Most likely secondary to poor p.o. intake.  And her liver cirrhosis. hyponatremia labs are more consistent with hypovolemic hypotonic hyponatremia.  She was initially treated with 3% hypertonic saline resulted overcorrection of sodium to 128. Hypertonic saline was discontinued and her sodium dropped to 126 without any further intervention. -Nephrology was consulted-appreciate  their recommendations. -They started her on salt tablets. -Continue to monitor  Hypokalemia.  Potassium was 2.9 again today with magnesium of 1.7. -Repleat electrolytes as needed and monitor.  Acute metabolic encephalopathy.  Seems chronic.  Patient is very pleasant but oriented to self and place not time.  CT head negative.  Ammonia level within normal limit at 28.  UA negative. Unknown baseline.  Patient apparently lives alone and some family member found her on floor and called the EMS.  Call daughter with no response. Patient told me that she do not have any kids, 1 daughter listed in her chart.  Adult Protective Services are involved and wants to take over this patient's custody.  Daughter does not want to be involved.  They were asking for psych evaluation. -Evaluation ordered. - PT/OT -commending SNF placement. -Social worker is working on her placement.  Diabetes mellitus without complication ([place]): Most recent A1c 5.1, well controled. Patient is taking Metformin at home -SSI  Liver cirrhosis [facility]): Ammonia level normal 28. INR 1.1 -continue home lactulose  HTN:  Pt is not on BP meds.  Blood pressure within goal. -monitoring Bp closely  Chronic diastolic CHF (congestive heart failure) ([place]): 2D echo on 08/16/2015 showed EF > 55%.  Patient has a trace leg edema.  No respiratory distress.  No pulmonary edema chest x-ray. No CHF symptoms, appears well compensated.  Patient is not taking diuretics currently. -check BN- 114  Foot ulcer: pt has a ulcer in right great toe, a very small lesion in left 2nd tod, and a scabbed lesion in  the left plantar area. No active draining. -wound care consult.   Objective: Vitals:   07/12/19 2204 07/12/19 2335 07/13/19 0500 07/13/19 0814  BP: (!) 105/44 110/70  (!) 105/57  Pulse: 99 97  96  Resp: 16 20  20   Temp: 97.6 F (36.4 C) 98.6 F (37 C)  97.9 F (36.6 C)  TempSrc: Oral Oral  Oral  SpO2: 100% 98%  98%  Weight:   87.4 kg    Height:       No intake or output data in the 24 hours ending 07/13/19 1134 Filed Weights   07/10/19 0923 07/12/19 0618 07/13/19 0500  Weight: 105.5 kg 89.8 kg 87.4 kg    Examination:  General exam: Appears calm and comfortable  Respiratory system: Clear to auscultation. Respiratory effort normal. Cardiovascular system: S1 & S2 heard, RRR. No JVD, murmurs, rubs, gallops or clicks. Gastrointestinal system: Soft, nontender, nondistended, bowel sounds positive. Central nervous system: Alert and oriented to self and place not to time.  No focal neurological deficits. Extremities: No edema, no cyanosis, pulses intact and symmetrical. Psychiatry: Judgement and insight appear impaired.   DVT prophylaxis: Lovenox Code Status: Full Family Communication: Daughter does not want to be involved.  Adult Protective Services are trying to take over custody.  Disposition Plan:  Status is: Inpatient  Remains inpatient appropriate because:Persistent severe electrolyte disturbances   Dispo: The patient is from: Home              Anticipated d/c is to: SNF              Anticipated d/c date is: 2 days              Patient currently is not medically stable to d/c.  Adult Protective Services are involved and they are trying to get the custody.  Psych evaluation done and she cannot take care of herself according to their note.Psych evaluation done and she cannot take care of herself and they are recommending guardianship.  Consultants:  Nephrology Psychiatry  Procedures:  Antimicrobials:   Data Reviewed: I have personally reviewed following labs and imaging studies  CBC: Recent Labs  Lab 07/10/19 1034 07/11/19 0440 07/13/19 0543  WBC 8.7 7.0 7.7  NEUTROABS 7.0  --   --   HGB 11.1* 10.7* 10.3*  HCT RESULTS UNAVAILABLE DUE TO INTERFERING SUBSTANCE 28.6* 29.4*  MCV RESULTS UNAVAILABLE DUE TO INTERFERING SUBSTANCE 83.1 90.2  PLT 217 201 505   Basic Metabolic Panel: Recent Labs  Lab  07/10/19 1034 07/10/19 1747 07/11/19 0440 07/11/19 0730 07/12/19 0806 07/12/19 1124 07/12/19 1524 07/12/19 1918 07/13/19 0543  NA 114*  114*   < > 128*   < > 126* 126* 127* 126* 129*  K 2.6*  --  2.7*   < > 3.5 3.4* 3.7 3.2* 2.9*  CL 73*  --  90*   < > 94* 92* 93* 91* 93*  CO2 29  --  28   < > 24 26 26 25 27   GLUCOSE 137*  --  114*   < > 112* 114* 125* 118* 117*  BUN <5*  --  <5*   < > <5* 5* 5* 5* <5*  CREATININE 0.34*  --  0.32*   < > 0.32* 0.47 0.42* 0.31* 0.31*  CALCIUM 8.3*  --  8.3*   < > 8.3* 8.3* 8.8* 8.7* 8.5*  MG 1.7  --  2.2  --   --   --   --   --  1.7   < > = values in this interval not displayed.   GFR: Estimated Creatinine Clearance: 78.6 mL/min (A) (by C-G formula based on SCr of 0.31 mg/dL (L)). Liver Function Tests: Recent Labs  Lab 07/10/19 1034 07/12/19 0347  AST 54* 35  ALT 26 21  ALKPHOS 133* 112  BILITOT 2.8* 1.7*  PROT 6.0* 5.2*  ALBUMIN 3.2* 2.8*   No results for input(s): LIPASE, AMYLASE in the last 168 hours. Recent Labs  Lab 07/10/19 1034  AMMONIA 28   Coagulation Profile: Recent Labs  Lab 07/10/19 1034 07/12/19 0347  INR 1.1 1.1   Cardiac Enzymes: No results for input(s): CKTOTAL, CKMB, CKMBINDEX, TROPONINI in the last 168 hours. BNP (last 3 results) No results for input(s): PROBNP in the last 8760 hours. HbA1C: No results for input(s): HGBA1C in the last 72 hours. CBG: Recent Labs  Lab 07/12/19 0826 07/12/19 1157 07/12/19 1705 07/12/19 2037 07/13/19 0742  GLUCAP 104* 116* 114* 141* 114*   Lipid Profile: No results for input(s): CHOL, HDL, LDLCALC, TRIG, CHOLHDL, LDLDIRECT in the last 72 hours. Thyroid Function Tests: Recent Labs    07/11/19 0440  TSH 1.186   Anemia Panel: No results for input(s): VITAMINB12, FOLATE, FERRITIN, TIBC, IRON, RETICCTPCT in the last 72 hours. Sepsis Labs: No results for input(s): PROCALCITON, LATICACIDVEN in the last 168 hours.  Recent Results (from the past 240 hour(s))  SARS  CORONAVIRUS 2 (TAT 6-24 HRS) Nasopharyngeal Nasopharyngeal Swab     Status: None   Collection Time: 07/10/19 10:34 AM   Specimen: Nasopharyngeal Swab  Result Value Ref Range Status   SARS Coronavirus 2 NEGATIVE NEGATIVE Final    Comment: (NOTE) SARS-CoV-2 target nucleic acids are NOT DETECTED. The SARS-CoV-2 RNA is generally detectable in upper and lower respiratory specimens during the acute phase of infection. Negative results do not preclude SARS-CoV-2 infection, do not rule out co-infections with other pathogens, and should not be used as the sole basis for treatment or other patient management decisions. Negative results must be combined with clinical observations, patient history, and epidemiological information. The expected result is Negative. Fact Sheet for Patients: SugarRoll.be Fact Sheet for Healthcare Providers: https://www.woods-mathews.com/ This test is not yet approved or cleared by the [place] FDA and  has been authorized for detection and/or diagnosis of SARS-CoV-2 by FDA under an Emergency Use Authorization (EUA). This EUA will remain  in effect (meaning this test can be used) for the duration of the COVID-19 declaration under Section 56 4(b)(1) of the Act, 21 U.S.C. section 360bbb-3(b)(1), unless the authorization is terminated or revoked sooner. Performed at [facility], [place] [address] [place], [place] [address]      Radiology Studies: No results found.  Scheduled Meds: . cholecalciferol  500 Units Oral Daily  . enoxaparin (LOVENOX) injection  40 mg Subcutaneous Q24H  . insulin aspart  0-5 Units Subcutaneous QHS  . insulin aspart  0-9 Units Subcutaneous TID WC  . lactulose  30 g Oral TID  . methocarbamol  750 mg Oral TID  . potassium chloride  40 mEq Oral Q4H  . sodium chloride  1 g Oral BID WC  . torsemide  5 mg Oral Daily   Continuous Infusions:   LOS: 3 days   Time spent: 40  minutes.  Lorella Nimrod, MD [facility]  If 7PM-7AM, please contact night-coverage [email or web address]  07/13/2019, 11:34 AM   This record has been created using Systems analyst. Errors have been sought and corrected,but  may not always be located. Such creation errors do not reflect on the standard of care.

## 2019-07-13 NOTE — NC FL2 (Signed)
[place] LEVEL OF CARE SCREENING TOOL     IDENTIFICATION  Patient Name: [name] Birthdate: 1955-08-09 Sex: female Admission Date (Current Location): 07/10/2019  [place] and [place] Number:  Engineering geologist and Address:  [facility], [address] [place], [place] [address]      Provider Number: [id number]  Attending Physician Name and Address:  Lorella Nimrod, MD  Relative Name and Phone Number:  Gladstone Pih APS [phone]    Current Level of Care: Hospital Recommended Level of Care: [place] Prior Approval Number:    Date Approved/Denied:   PASRR Number: [id number]  Discharge Plan: SNF    Current Diagnoses: Patient Active Problem List   Diagnosis Date Noted  . Acute metabolic encephalopathy 73/42/8768  . Hypertension   . Depression   . Chronic diastolic CHF (congestive heart failure) ([place])   . Hypokalemia   . Liver cirrhosis ([place])   . Hyponatremia 11/10/2018  . Osteoarthritis of knee (Right) 07/14/2017  . Tricompartment osteoarthritis of knee (Right) 07/14/2017  . Chronic knee pain (Primary Area of Pain) (Right) 06/09/2017  . Chronic low back pain (Secondary Area of Pain) (Bilateral) (R>L) 06/09/2017  . Chronic neck pain (Tertiary Area of Pain) 06/09/2017  . Chronic shoulder pain (Fourth Area of Pain) (Bilateral) (R>L) 06/09/2017  . Chronic pain syndrome 06/09/2017  . Pharmacologic therapy 06/09/2017  . Disorder of skeletal system 06/09/2017  . Problems influencing health status 06/09/2017  . DJD (degenerative joint disease) of knee 09/21/2015  . Diabetes mellitus without complication ([place]) 11/57/2620  . Fibromyalgia 09/21/2015  . Cirrhosis of liver ([place]) 09/21/2015  . Hepatic cirrhosis ([place]) 11/18/2014  . Polyp of colon 11/18/2014    Orientation RESPIRATION BLADDER Height & Weight     Self, Place    Incontinent Weight: 87.4 kg Height:  5' 5"  (165.1 cm)  BEHAVIORAL SYMPTOMS/MOOD  NEUROLOGICAL BOWEL NUTRITION STATUS      Continent    AMBULATORY STATUS COMMUNICATION OF NEEDS Skin   Extensive Assist Verbally Bruising                       Personal Care Assistance Level of Assistance  Bathing, Dressing Bathing Assistance: Limited assistance   Dressing Assistance: Limited assistance     Functional Limitations Info             SPECIAL CARE FACTORS FREQUENCY  PT (By licensed PT), OT (By licensed OT)     PT Frequency: 5 times per week OT Frequency: 5 times per week            Contractures Contractures Info: Not present    Additional Factors Info  Allergies   Allergies Info: vicodin           Current Medications (07/13/2019):  This is the current hospital active medication list Current Facility-Administered Medications  Medication Dose Route Frequency Provider Last Rate Last Admin  . cholecalciferol (VITAMIN D3) tablet 500 Units  500 Units Oral Daily Ivor Costa, MD   500 Units at 07/13/19 0801  . enoxaparin (LOVENOX) injection 40 mg  40 mg Subcutaneous Q24H Ivor Costa, MD   40 mg at 07/13/19 0516  . ibuprofen (ADVIL) tablet 400 mg  400 mg Oral Q6H PRN Lorella Nimrod, MD      . insulin aspart (novoLOG) injection 0-5 Units  0-5 Units Subcutaneous QHS Ivor Costa, MD      . insulin aspart (novoLOG) injection 0-9 Units  0-9 Units Subcutaneous TID  WC Ivor Costa, MD   1 Units at 07/11/19 1708  . lactulose (CHRONULAC) 10 GM/15ML solution 30 g  30 g Oral TID Ivor Costa, MD   30 g at 07/13/19 0803  . magnesium sulfate IVPB 2 g 50 mL  2 g Intravenous Once Lorella Nimrod, MD      . methocarbamol (ROBAXIN) tablet 750 mg  750 mg Oral TID Ivor Costa, MD   750 mg at 07/13/19 0801  . ondansetron (ZOFRAN) injection 4 mg  4 mg Intravenous Q8H PRN Ivor Costa, MD      . potassium chloride SA (KLOR-CON) CR tablet 40 mEq  40 mEq Oral Q4H Amin, Soundra Pilon, MD      . sodium chloride tablet 1 g  1 g Oral BID WC Murlean Iba, MD   1 g at 07/12/19 1653  . torsemide  (DEMADEX) tablet 5 mg  5 mg Oral Daily Murlean Iba, MD   5 mg at 07/12/19 1652     Discharge Medications: Please see discharge summary for a list of discharge medications.  Relevant Imaging Results:  Relevant Lab Results:   Additional Information SS# [id number]  Su Hilt, RN

## 2019-07-13 NOTE — Progress Notes (Signed)
[address] [place] [address] Phone [phone]. Fax [phone]  Date: 07/13/2019                  Patient Name:  [name]  MRN: [id number]  DOB: 1955/09/11  Age / Sex: 64 y.o., female         PCP: [facility]                 Service Requesting Consult: IM/ Lorella Nimrod, MD                 Reason for Consult: Hyponatremia            History of Present Illness: Patient is a 64 y.o. female with multiple medical problems, who was admitted to [facility] on 07/10/2019 for evaluation of confusion. Patient not able to provide meaningful information. Per ER notes, Patient's family called EMS due to her being unable to care for herself.  She lives alone.  She was found on the floor by EMS. Found to have critically low sodium at admission of 114 Last known Na 132 in 10/2018   Today, she feels fair.  Able to answer more questions but still not oriented to time.  She is oriented to self.  Asking about going home   Current medications: Current Facility-Administered Medications  Medication Dose Route Frequency Provider Last Rate Last Admin  . cholecalciferol (VITAMIN D3) tablet 500 Units  500 Units Oral Daily Ivor Costa, MD   500 Units at 07/13/19 0801  . enoxaparin (LOVENOX) injection 40 mg  40 mg Subcutaneous Q24H Ivor Costa, MD   40 mg at 07/13/19 0516  . ibuprofen (ADVIL) tablet 400 mg  400 mg Oral Q6H PRN Lorella Nimrod, MD   400 mg at 07/13/19 1049  . insulin aspart (novoLOG) injection 0-5 Units  0-5 Units Subcutaneous QHS Ivor Costa, MD      . insulin aspart (novoLOG) injection 0-9 Units  0-9 Units Subcutaneous TID WC Ivor Costa, MD   1 Units at 07/11/19 1708  . lactulose (CHRONULAC) 10 GM/15ML solution 30 g  30 g Oral TID Ivor Costa, MD   30 g at 07/13/19 0803  . magnesium sulfate IVPB 2 g 50 mL  2 g Intravenous Once Lorella Nimrod, MD 50 mL/hr at 07/13/19 1021 2 g at 07/13/19 1021  . methocarbamol (ROBAXIN) tablet 750 mg  750 mg Oral TID  Ivor Costa, MD   750 mg at 07/13/19 0801  . ondansetron (ZOFRAN) injection 4 mg  4 mg Intravenous Q8H PRN Ivor Costa, MD      . potassium chloride SA (KLOR-CON) CR tablet 40 mEq  40 mEq Oral Q4H Lorella Nimrod, MD   40 mEq at 07/13/19 1018  . sodium chloride tablet 1 g  1 g Oral BID WC Murlean Iba, MD   1 g at 07/13/19 1041  . torsemide (DEMADEX) tablet 5 mg  5 mg Oral Daily Murlean Iba, MD   5 mg at 07/13/19 1020      Allergies: Allergies  Allergen Reactions  . Vicodin [Hydrocodone-Acetaminophen] Other (See Comments) and Rash    nightmares nightmares Per patient she crawls all over the bed     Vital Signs: Blood pressure (!) 105/57, pulse 96, temperature 97.9 F (36.6 C), temperature source Oral, resp. rate 20, height 5' 5"  (1.651 m), weight 87.4 kg, SpO2 98 %.   Intake/Output Summary (Last 24 hours) at 07/13/2019 1053 Last data filed at 07/12/2019 1100 Gross per  24 hour  Intake --  Output 300 ml  Net -300 ml    Weight trends: Filed Weights   07/10/19 0923 07/12/19 0618 07/13/19 0500  Weight: 105.5 kg 89.8 kg 87.4 kg    Physical Exam: General:  NAD,    HEENT  moist oral mucous membranes  Neck:  Supple, no masses  Lungs:  Clear to auscultation  Heart::  Irregular rhythm  Abdomen:  Soft, nontender  Extremities:  no edema  Neurologic: Alert, oriented to self and place.  Knows her birthday.  Skin: Bruise on left forearm    Lab results: Basic Metabolic Panel: Recent Labs  Lab 07/10/19 1034 07/10/19 1747 07/11/19 0440 07/11/19 0730 07/12/19 1524 07/12/19 1918 07/13/19 0543  NA 114*  114*   < > 128*   < > 127* 126* 129*  K 2.6*  --  2.7*   < > 3.7 3.2* 2.9*  CL 73*  --  90*   < > 93* 91* 93*  CO2 29  --  28   < > 26 25 27   GLUCOSE 137*  --  114*   < > 125* 118* 117*  BUN <5*  --  <5*   < > 5* 5* <5*  CREATININE 0.34*  --  0.32*   < > 0.42* 0.31* 0.31*  CALCIUM 8.3*  --  8.3*   < > 8.8* 8.7* 8.5*  MG 1.7  --  2.2  --   --   --  1.7   < > = values in  this interval not displayed.    Liver Function Tests: Recent Labs  Lab 07/12/19 0347  AST 35  ALT 21  ALKPHOS 112  BILITOT 1.7*  PROT 5.2*  ALBUMIN 2.8*   No results for input(s): LIPASE, AMYLASE in the last 168 hours. Recent Labs  Lab 07/10/19 1034  AMMONIA 28    CBC: Recent Labs  Lab 07/10/19 1034 07/10/19 1034 07/11/19 0440 07/13/19 0543  WBC 8.7   < > 7.0 7.7  NEUTROABS 7.0  --   --   --   HGB 11.1*   < > 10.7* 10.3*  HCT RESULTS UNAVAILABLE DUE TO INTERFERING SUBSTANCE   < > 28.6* 29.4*  MCV RESULTS UNAVAILABLE DUE TO INTERFERING SUBSTANCE   < > 83.1 90.2  PLT 217   < > 201 194   < > = values in this interval not displayed.    Cardiac Enzymes: No results for input(s): CKTOTAL, TROPONINI in the last 168 hours.  BNP: Invalid input(s): POCBNP  CBG: Recent Labs  Lab 07/12/19 0826 07/12/19 1157 07/12/19 1705 07/12/19 2037 07/13/19 0742  GLUCAP 104* 116* 114* 141* 114*    Microbiology: Recent Results (from the past 720 hour(s))  SARS CORONAVIRUS 2 (TAT 6-24 HRS) Nasopharyngeal Nasopharyngeal Swab     Status: None   Collection Time: 07/10/19 10:34 AM   Specimen: Nasopharyngeal Swab  Result Value Ref Range Status   SARS Coronavirus 2 NEGATIVE NEGATIVE Final    Comment: (NOTE) SARS-CoV-2 target nucleic acids are NOT DETECTED. The SARS-CoV-2 RNA is generally detectable in upper and lower respiratory specimens during the acute phase of infection. Negative results do not preclude SARS-CoV-2 infection, do not rule out co-infections with other pathogens, and should not be used as the sole basis for treatment or other patient management decisions. Negative results must be combined with clinical observations, patient history, and epidemiological information. The expected result is Negative. Fact Sheet for Patients: SugarRoll.be Fact Sheet for Healthcare Providers:  https://www.woods-mathews.com/ This test is not  yet approved or cleared by the [place] and  has been authorized for detection and/or diagnosis of SARS-CoV-2 by FDA under an Emergency Use Authorization (EUA). This EUA will remain  in effect (meaning this test can be used) for the duration of the COVID-19 declaration under Section 56 4(b)(1) of the Act, 21 U.S.C. section 360bbb-3(b)(1), unless the authorization is terminated or revoked sooner. Performed at [facility], [place] [address], [place], [place] [address]      Coagulation Studies: Recent Labs    07/12/19 0347  LABPROT 14.4  INR 1.1    Urinalysis: No results for input(s): COLORURINE, LABSPEC, PHURINE, GLUCOSEU, HGBUR, BILIRUBINUR, KETONESUR, PROTEINUR, UROBILINOGEN, NITRITE, LEUKOCYTESUR in the last 72 hours.  Invalid input(s): APPERANCEUR      Imaging: No results found.   Assessment & Plan: Pt is a 64 y.o. Caucasian female with congestive heart failure, depression, diabetes, fibromyalgia, gout, hyperlipidemia, hypertension, cirrhosis, was admitted on 07/10/2019 with Acute hyponatremia [E87.1] Hyponatremia [E87.1] Acute encephalopathy [E52.77] Acute metabolic encephalopathy [O24.23]  #Hyponatremia, symptomatic with confusion Last known sodium of 132 in August 2020 suggesting chronic hyponatremia Comes in with acutely low level of 114.  Baseline mental status is unknown but appears to be confused at presentation.  Today, mental status is better compared to yesterday.   Chronic hyponatremia is likely secondary to liver disease, with acute worsening from dehydration.  TSH normal at 1.186  Patient was treated with 3% saline initially.  It was discontinued when sodium increased from 114->128 Since then sodium level has been stable between 126-128 Average rate of correction is acceptable Encourage oral intake of food or Ensure Patient has been started on sodium chloride tablets 1 g twice a day along with low-dose diuretic Improvement in sodium to 129  today Continue to monitor closely.  #Hypokalemia Likely nutritional and due to diuresis Expected to improve with better nutrition Agree with oral supplementation   LOS: 3 Moksha Dorgan 4/13/202110:53 AM    Note: This note was prepared with Dragon dictation. Any transcription errors are unintentional

## 2019-07-13 NOTE — TOC Progression Note (Addendum)
Transition of Care [facility]) - Progression Note    Patient Details  Name: [name] MRN: [id number] Date of Birth: December 24, 1955  Transition of Care [facility]) CM/SW [place], RN Phone Number: 07/13/2019, 2:33 PM  Clinical Narrative:    Reviewed the bed offers with [facility] from [place], she chose the [facility] I called [place] to start insurance authorization, faxed clinical to [phone], ref number [id number]      Expected Discharge Plan and Services                                                 Social Determinants of Health (SDOH) Interventions    Readmission Risk Interventions No flowsheet data found.

## 2019-07-13 NOTE — Care Management Important Message (Signed)
Important Message  Patient Details  Name: [name] MRN: [id number] Date of Birth: May 24, 1955   Medicare Important Message Given:  Yes     [name] 07/13/2019, 10:21 AM

## 2019-07-13 NOTE — TOC Progression Note (Signed)
Transition of Care [facility]) - Progression Note    Patient Details  Name: [name] MRN: [id number] Date of Birth: 1955/11/03  Transition of Care [facility]) CM/SW Bode, RN Phone Number: 07/13/2019, 9:14 AM  Clinical Narrative:    Had a lengthy conversation with [facility] the APS worker, she is starting the application for Medicaid for long term placement, she stated the DSS is taking emergent custody of the patient and they want her to go to SNF, bed search started, FL2 completed, Passr done        Expected Discharge Plan and Services                                                 Social Determinants of Health (SDOH) Interventions    Readmission Risk Interventions No flowsheet data found.

## 2019-07-13 NOTE — Progress Notes (Signed)
Patient removed IV herself from LFA , States " It burns , I can't take it !". Notified Dr. Reesa Chew

## 2019-07-14 LAB — COMPREHENSIVE METABOLIC PANEL
ALT: 16 U/L (ref 0–44)
AST: 32 U/L (ref 15–41)
Albumin: 2.9 g/dL — ABNORMAL LOW (ref 3.5–5.0)
Alkaline Phosphatase: 112 U/L (ref 38–126)
Anion gap: 9 (ref 5–15)
BUN: <5 mg/dL — ABNORMAL LOW (ref 8–23)
CO2: 27 mmol/L (ref 22–32)
Calcium: 8.4 mg/dL — ABNORMAL LOW (ref 8.9–10.3)
Chloride: 96 mmol/L — ABNORMAL LOW (ref 98–111)
Creatinine, Ser: <0.3 mg/dL — ABNORMAL LOW (ref 0.44–1.00)
Glucose, Bld: 90 mg/dL (ref 70–99)
Potassium: 3.4 mmol/L — ABNORMAL LOW (ref 3.5–5.1)
Sodium: 132 mmol/L — ABNORMAL LOW (ref 135–145)
Total Bilirubin: 1.3 mg/dL — ABNORMAL HIGH (ref 0.3–1.2)
Total Protein: 5.5 g/dL — ABNORMAL LOW (ref 6.5–8.1)

## 2019-07-14 LAB — GLUCOSE, CAPILLARY
Glucose-Capillary: 92 mg/dL (ref 70–99)
Glucose-Capillary: 105 mg/dL — ABNORMAL HIGH (ref 70–99)
Glucose-Capillary: 107 mg/dL — ABNORMAL HIGH (ref 70–99)
Glucose-Capillary: 112 mg/dL — ABNORMAL HIGH (ref 70–99)

## 2019-07-14 LAB — RESPIRATORY PANEL BY RT PCR (FLU A&B, COVID)
Influenza A by PCR: NEGATIVE
Influenza B by PCR: NEGATIVE
SARS Coronavirus 2 by RT PCR: NEGATIVE

## 2019-07-14 MED ORDER — METOPROLOL TARTRATE 25 MG PO TABS: 25.0000 mg | ORAL_TABLET | Freq: Two times a day (BID) | ORAL | Status: DC

## 2019-07-14 MED ORDER — METHOCARBAMOL 750 MG PO TABS: 750.0000 mg | ORAL_TABLET | Freq: Three times a day (TID) | ORAL | Status: DC | PRN

## 2019-07-14 MED ORDER — POTASSIUM CHLORIDE CRYS ER 20 MEQ PO TBCR: 20.0000 meq | EXTENDED_RELEASE_TABLET | Freq: Every day | ORAL | 0 refills | Status: DC

## 2019-07-14 MED ORDER — SODIUM CHLORIDE 1 G PO TABS: 1.0000 g | ORAL_TABLET | Freq: Two times a day (BID) | ORAL | Status: DC

## 2019-07-14 MED ORDER — METOPROLOL TARTRATE 25 MG PO TABS: 12.5000 mg | ORAL_TABLET | Freq: Two times a day (BID) | ORAL | Status: DC

## 2019-07-14 MED ORDER — POTASSIUM CHLORIDE CRYS ER 20 MEQ PO TBCR
40.0000 meq | EXTENDED_RELEASE_TABLET | Freq: Every day | ORAL | Status: DC
  Administered 2019-07-14 – 2019-07-15 (×2): 40 meq via ORAL
  Filled 2019-07-14 (×2): qty 2

## 2019-07-14 MED ORDER — ACETAMINOPHEN 500 MG PO TABS: 500.0000 mg | ORAL_TABLET | Freq: Three times a day (TID) | ORAL | 0 refills | Status: DC | PRN

## 2019-07-14 MED ORDER — TORSEMIDE 5 MG PO TABS: 5.0000 mg | ORAL_TABLET | Freq: Every day | ORAL | Status: DC

## 2019-07-14 MED ORDER — METOPROLOL TARTRATE 25 MG PO TABS
12.5000 mg | ORAL_TABLET | Freq: Two times a day (BID) | ORAL | Status: DC
  Administered 2019-07-14 – 2019-07-15 (×3): 12.5 mg via ORAL
  Filled 2019-07-14 (×3): qty 1

## 2019-07-14 MED ORDER — LACTULOSE 10 GM/15ML PO SOLN: 30.0000 g | Freq: Three times a day (TID) | ORAL | 0 refills | Status: DC

## 2019-07-14 NOTE — TOC Progression Note (Signed)
Transition of Care [facility]) - Progression Note    Patient Details  Name: [name] MRN: [id number] Date of Birth: [birth year]  Transition of Care [facility]) CM/SW Tanaina, RN Phone Number: 07/14/2019, 1:19 PM  Clinical Narrative:     Received a call from [facility] with auth approval [id number], Ref number [id number], start date today, next review date 4/16 coordinator is Artist Pais, I notified Dough at the [facility] and the physician       Expected Discharge Plan and Services                                                 Social Determinants of Health (SDOH) Interventions    Readmission Risk Interventions No flowsheet data found.

## 2019-07-14 NOTE — Progress Notes (Signed)
PROGRESS NOTE    [name]  MBW:[id number] DOB: 1955/09/05 DOA: 07/10/2019 PCP: [facility]   Brief Narrative:  [name] is a 64 y.o. female with medical history significant of liver cirrhosis, hypertension, hyperlipidemia, diabetes mellitus, gout, depression, IBS, fibromyalgia, dCHF, hernia, who presents with altered mental status. Pre report, pt lives alone at home. She was noted to be confused, and is not able to take care of herself. She is not taking her meds, not eating and drinking well. Patient actually comes under IVC due to confusion. Per report, pt has soiled clothing. When I saw pt in ED, she is confused.  She knows her own name, and knows that she is in hospital, but is not orientated to the time.  Has had recent admissions for hyponatremia and UTI. Found to have sodium of 114, started on 3% normal saline in ED, which was discontinued overnight due to overcorrection.  Subjective: Pt complained about not being able to get out of bed.  Can't remember events leading up to her hospitalization, but remember events before and after.  No fever, dyspnea, pain, N/V/D, dysuria.  Pt said she felt constipated despite being on scheduled lactulose.   Assessment & Plan:   Principal Problem:   Acute metabolic encephalopathy Active Problems:   Diabetes mellitus without complication (HCC)   Hyponatremia   Liver cirrhosis (HCC)   Hypertension   Chronic diastolic CHF (congestive heart failure) (HCC)   Hypokalemia  Acute on chronic hyponatremia, Improved  Last known sodium of 132 in August 2020 suggesting chronic hyponatremia.  Found to have sodium of 114 on presentation, started on 3% normal saline in ED, which was discontinued overnight due to overcorrection to 128.  Chronic hyponatremia is likely secondary to liver disease, with acute worsening from dehydration. TSH normal at 1.186. Nephrology was consulted and pt was started on sodium chloride tablets 1  g twice a dayalong with low-dose diuretic.  Pt will continue both until outpatient nephrology followup.  Need to ensure adequate oral food and fluid intake.   Hypokalemia Likely nutritionaland due to diuresis.  Expected to improve with better nutrition.   Potassium repleted PRN   Acute metabolic encephalopathy, improved Maybe chronic, unclear baseline. Patient is very pleasant but oriented to self and place, not time. CT head negative. Ammonia level within normal limit at 28. UA negative.  Patient apparently lives alone and some family member found her on floor and called the EMS. Daughter did not want to be involved.  Pt said she has a daughter and a son.  Adult Protective Services were involved and took over this patient's custody.   Diabetes mellitus without complication ([place]) Most recent A1c5.1, well controled.   Liver cirrhosis (HCC) Ammonia level normal 28. INR 1.1.  Continued homelactulose to aim for 2-3 BM's per day.  Hx of HTN, not currently active Pt is not on BPmeds. Blood pressure within goal.  Chronic diastolic CHF  2D echo on 0/17/7939 showed EF>55%.Patient has a trace leg edema. No respiratory distress. No pulmonary edema chest x-ray.NoCHF symptoms,appeared wellcompensated.BNP 114.  Patient was not taking diuretics PTA.  Pt was started on torsemide 5 mg daily plus salt tablets for hyponatremia.    Foot ulcer pt has a ulcer in right great toe, a very small lesion in left 2nd tod, and a scabbed lesion in the left plantar area. No active draining.  Afib, new dx No prior hx on record.  Pt has been in Afib with HR range  between 80's-100's.   --Start on low dose metop 12.6 mg BID for rate control.   Objective: Vitals:   07/14/19 0759 07/14/19 0801 07/14/19 0929 07/14/19 1430  BP: (!) 133/106 129/68 (!) 104/59 111/60  Pulse: (!) 105 92 94 97  Resp: 18     Temp: 98.1 F (36.7 C)     TempSrc: Oral     SpO2: 96%     Weight:      Height:         Intake/Output Summary (Last 24 hours) at 07/14/2019 1946 Last data filed at 07/14/2019 1845 Gross per 24 hour  Intake 360 ml  Output 500 ml  Net -140 ml   Filed Weights   07/12/19 0618 07/13/19 0500 07/14/19 0411  Weight: 89.8 kg 87.4 kg 87 kg    Examination:  Constitutional: NAD, AAOx3 HEENT: conjunctivae and lids normal, EOMI CV: No cyanosis.   RESP: normal respiratory effort  Extremities: No effusions, edema in BLE SKIN: warm, dry and intact Neuro: II - XII grossly intact.   Psych: Normal mood and affect.    DVT prophylaxis: Lovenox Code Status: Full Daughter does not want to be involved.  Adult Protective Services are trying to take over custody.  Psych evaluation done and she cannot take care of herself according to their note.  Disposition Plan: Medically ready for discharge.  To SNF when bed available.    Consultants:  Nephrology Psychiatry  Procedures:  Antimicrobials:   Data Reviewed: I have personally reviewed following labs and imaging studies  CBC: Recent Labs  Lab 07/10/19 1034 07/11/19 0440 07/13/19 0543  WBC 8.7 7.0 7.7  NEUTROABS 7.0  --   --   HGB 11.1* 10.7* 10.3*  HCT RESULTS UNAVAILABLE DUE TO INTERFERING SUBSTANCE 28.6* 29.4*  MCV RESULTS UNAVAILABLE DUE TO INTERFERING SUBSTANCE 83.1 90.2  PLT 217 201 292   Basic Metabolic Panel: Recent Labs  Lab 07/10/19 1034 07/10/19 1747 07/11/19 0440 07/11/19 0730 07/12/19 1124 07/12/19 1524 07/12/19 1918 07/13/19 0543 07/13/19 1222 07/14/19 0453  NA 114*  114*   < > 128*   < > 126* 127* 126* 129*  --  132*  K 2.6*  --  2.7*   < > 3.4* 3.7 3.2* 2.9*  --  3.4*  CL 73*  --  90*   < > 92* 93* 91* 93*  --  96*  CO2 29  --  28   < > 26 26 25 27   --  27  GLUCOSE 137*  --  114*   < > 114* 125* 118* 117*  --  90  BUN <5*  --  <5*   < > 5* 5* 5* <5*  --  <5*  CREATININE 0.34*  --  0.32*   < > 0.47 0.42* 0.31* 0.31*  --  <0.30*  CALCIUM 8.3*  --  8.3*   < > 8.3* 8.8* 8.7* 8.5*  --  8.4*   MG 1.7  --  2.2  --   --   --   --  1.7 2.6*  --    < > = values in this interval not displayed.   GFR: CrCl cannot be calculated (This lab value cannot be used to calculate CrCl because it is not a number: <0.30). Liver Function Tests: Recent Labs  Lab 07/10/19 1034 07/12/19 0347 07/14/19 0453  AST 54* 35 32  ALT 26 21 16   ALKPHOS 133* 112 112  BILITOT 2.8* 1.7* 1.3*  PROT 6.0* 5.2* 5.5*  ALBUMIN 3.2* 2.8* 2.9*   No results for input(s): LIPASE, AMYLASE in the last 168 hours. Recent Labs  Lab 07/10/19 1034  AMMONIA 28   Coagulation Profile: Recent Labs  Lab 07/10/19 1034 07/12/19 0347  INR 1.1 1.1   Cardiac Enzymes: No results for input(s): CKTOTAL, CKMB, CKMBINDEX, TROPONINI in the last 168 hours. BNP (last 3 results) No results for input(s): PROBNP in the last 8760 hours. HbA1C: No results for input(s): HGBA1C in the last 72 hours. CBG: Recent Labs  Lab 07/13/19 1652 07/13/19 2119 07/14/19 0741 07/14/19 1205 07/14/19 1702  GLUCAP 106* 101* 92 105* 107*   Lipid Profile: No results for input(s): CHOL, HDL, LDLCALC, TRIG, CHOLHDL, LDLDIRECT in the last 72 hours. Thyroid Function Tests: No results for input(s): TSH, T4TOTAL, FREET4, T3FREE, THYROIDAB in the last 72 hours. Anemia Panel: No results for input(s): VITAMINB12, FOLATE, FERRITIN, TIBC, IRON, RETICCTPCT in the last 72 hours. Sepsis Labs: No results for input(s): PROCALCITON, LATICACIDVEN in the last 168 hours.  Recent Results (from the past 240 hour(s))  SARS CORONAVIRUS 2 (TAT 6-24 HRS) Nasopharyngeal Nasopharyngeal Swab     Status: None   Collection Time: 07/10/19 10:34 AM   Specimen: Nasopharyngeal Swab  Result Value Ref Range Status   SARS Coronavirus 2 NEGATIVE NEGATIVE Final    Comment: (NOTE) SARS-CoV-2 target nucleic acids are NOT DETECTED. The SARS-CoV-2 RNA is generally detectable in upper and lower respiratory specimens during the acute phase of infection. Negative results do not  preclude SARS-CoV-2 infection, do not rule out co-infections with other pathogens, and should not be used as the sole basis for treatment or other patient management decisions. Negative results must be combined with clinical observations, patient history, and epidemiological information. The expected result is Negative. Fact Sheet for Patients: SugarRoll.be Fact Sheet for Healthcare Providers: https://www.woods-mathews.com/ This test is not yet approved or cleared by the [place] FDA and  has been authorized for detection and/or diagnosis of SARS-CoV-2 by FDA under an Emergency Use Authorization (EUA). This EUA will remain  in effect (meaning this test can be used) for the duration of the COVID-19 declaration under Section 56 4(b)(1) of the Act, 21 U.S.C. section 360bbb-3(b)(1), unless the authorization is terminated or revoked sooner. Performed at [facility], [place] [address], [place], [place] [address]   Respiratory Panel by RT PCR (Flu A&B, Covid) - Nasopharyngeal Swab     Status: None   Collection Time: 07/14/19  3:29 PM   Specimen: Nasopharyngeal Swab  Result Value Ref Range Status   SARS Coronavirus 2 by RT PCR NEGATIVE NEGATIVE Final    Comment: (NOTE) SARS-CoV-2 target nucleic acids are NOT DETECTED. The SARS-CoV-2 RNA is generally detectable in upper respiratoy specimens during the acute phase of infection. The lowest concentration of SARS-CoV-2 viral copies this assay can detect is 131 copies/mL. A negative result does not preclude SARS-Cov-2 infection and should not be used as the sole basis for treatment or other patient management decisions. A negative result may occur with  improper specimen collection/handling, submission of specimen other than nasopharyngeal swab, presence of viral mutation(s) within the areas targeted by this assay, and inadequate number of viral copies (<131 copies/mL). A negative result must be  combined with clinical observations, patient history, and epidemiological information. The expected result is Negative. Fact Sheet for Patients:  PinkCheek.be Fact Sheet for Healthcare Providers:  GravelBags.it This test is not yet ap proved or cleared by the [place] and  has been authorized  for detection and/or diagnosis of SARS-CoV-2 by FDA under an Emergency Use Authorization (EUA). This EUA will remain  in effect (meaning this test can be used) for the duration of the COVID-19 declaration under Section 564(b)(1) of the Act, 21 U.S.C. section 360bbb-3(b)(1), unless the authorization is terminated or revoked sooner.    Influenza A by PCR NEGATIVE NEGATIVE Final   Influenza B by PCR NEGATIVE NEGATIVE Final    Comment: (NOTE) The Xpert Xpress SARS-CoV-2/FLU/RSV assay is intended as an aid in  the diagnosis of influenza from Nasopharyngeal swab specimens and  should not be used as a sole basis for treatment. Nasal washings and  aspirates are unacceptable for Xpert Xpress SARS-CoV-2/FLU/RSV  testing. Fact Sheet for Patients: PinkCheek.be Fact Sheet for Healthcare Providers: GravelBags.it This test is not yet approved or cleared by the [place] FDA and  has been authorized for detection and/or diagnosis of SARS-CoV-2 by  FDA under an Emergency Use Authorization (EUA). This EUA will remain  in effect (meaning this test can be used) for the duration of the  Covid-19 declaration under Section 564(b)(1) of the Act, 21  U.S.C. section 360bbb-3(b)(1), unless the authorization is  terminated or revoked. Performed at [facility], [address] [place], [place] [address]      Radiology Studies: No results found.  Scheduled Meds: . cholecalciferol  500 Units Oral Daily  . enoxaparin (LOVENOX) injection  40 mg Subcutaneous Q24H  . lactulose  30 g  Oral TID  . methocarbamol  750 mg Oral TID  . metoprolol tartrate  12.5 mg Oral BID  . potassium chloride  40 mEq Oral Daily  . sodium chloride  1 g Oral BID WC  . torsemide  5 mg Oral Daily   Continuous Infusions:   LOS: 4 days    Enzo Bi, MD [facility]  If 7PM-7AM, please contact night-coverage [email or web address]  07/14/2019, 7:46 PM   This record has been created using Systems analyst. Errors have been sought and corrected,but may not always be located. Such creation errors do not reflect on the standard of care.

## 2019-07-14 NOTE — Progress Notes (Signed)
Physical Therapy Treatment Patient Details Name: [name] MRN: [id number] DOB: November 16, 1955 Today's Date: 07/14/2019    History of Present Illness Pt admitted for acute metabolic encephalopathy with complaints of AMS. Other PMH includes liver cirrhosis, HTN, DM, gout, depression, IBS, fibromyaliga, CHF, and hernia.    PT Comments    Pt is making good progress towards goals with ability to ambulate room distances. HR at 124bpm pre and 141bpm post exertion. Somewhat self limited with exertional effort. Still remains very confused with poor carryover noted (reports she hasn't walked in a week). She has no recollection of previous session. Good endurance with there-ex, however fatigues. Pt is not safe for dc home due to poor cognition/high falls risk and decreased mobility efforts. Will continue to progress as able.   Follow Up Recommendations  SNF     Equipment Recommendations  None recommended by PT    Recommendations for Other Services       Precautions / Restrictions Precautions Precautions: Fall Restrictions Weight Bearing Restrictions: No    Mobility  Bed Mobility Overal bed mobility: Needs Assistance Bed Mobility: Supine to Sit;Sit to Supine     Supine to sit: HOB elevated;Min guard     General bed mobility comments: able to follow commands for safe transfer.  Transfers Overall transfer level: Needs assistance Equipment used: Rolling walker (2 wheeled) Transfers: Sit to/from Stand Sit to Stand: Min guard         General transfer comment: pulls up on RW for transfer. UPright posture noted  Ambulation/Gait Ambulation/Gait assistance: Min guard Gait Distance (Feet): 40 Feet Assistive device: Rolling walker (2 wheeled) Gait Pattern/deviations: Step-through pattern     General Gait Details: ambulated in room with short reciprocal gait pattern. IMproved obstacle awareness with mobility efforts. Somewhat self limiting and doesn't want to ambulate in  hallway.   Stairs             Wheelchair Mobility    Modified Rankin (Stroke Patients Only)       Balance Overall balance assessment: Needs assistance Sitting-balance support: Feet supported Sitting balance-Leahy Scale: Good     Standing balance support: Bilateral upper extremity supported Standing balance-Leahy Scale: Good                              Cognition Arousal/Alertness: Awake/alert Behavior During Therapy: WFL for tasks assessed/performed Overall Cognitive Status: History of cognitive impairments - at baseline                                 General Comments: still very confused to date, year and situation.       Exercises Other Exercises Other Exercises: supine ther-ex performed on B LE including AP, SAQ, hip abd/add, and SLRs. Also performed B UE shoulder flexion. All ther-ex performed x 10-12 reps with supervision/cga. Safe technique, however fatigues with exertion    General Comments        Pertinent Vitals/Pain Pain Assessment: No/denies pain    Home Living                      Prior Function            PT Goals (current goals can now be found in the care plan section) Acute Rehab PT Goals Patient Stated Goal: wants to go home PT Goal Formulation: With patient Time For Goal Achievement: 07/26/19  Potential to Achieve Goals: Good Progress towards PT goals: Progressing toward goals    Frequency    Min 2X/week      PT Plan Current plan remains appropriate    Co-evaluation              AM-PAC PT "6 Clicks" Mobility   Outcome Measure  Help needed turning from your back to your side while in a flat bed without using bedrails?: None Help needed moving from lying on your back to sitting on the side of a flat bed without using bedrails?: A Little Help needed moving to and from a bed to a chair (including a wheelchair)?: A Little Help needed standing up from a chair using your arms (e.g.,  wheelchair or bedside chair)?: A Little Help needed to walk in hospital room?: A Little Help needed climbing 3-5 steps with a railing? : A Little 6 Click Score: 19    End of Session Equipment Utilized During Treatment: Gait belt Activity Tolerance: Patient tolerated treatment well Patient left: in chair;with chair alarm set Nurse Communication: Mobility status PT Visit Diagnosis: Unsteadiness on feet (R26.81);Muscle weakness (generalized) (M62.81);History of falling (Z91.81);Difficulty in walking, not elsewhere classified (R26.2)     Time: 8889-1694 PT Time Calculation (min) (ACUTE ONLY): 23 min  Charges:  $Gait Training: 8-22 mins $Therapeutic Exercise: 8-22 mins                     Greggory Stallion, PT, DPT [phone]    [name] 07/14/2019, 10:22 AM

## 2019-07-14 NOTE — Progress Notes (Signed)
[address] [place], [place] [address] Phone [phone]. Fax [phone]  Date: 07/14/2019                  Patient Name:  [name]  MRN: [id number]  DOB: 1955/12/21  Age / Sex: 64 y.o., female         PCP: [facility]                 Service Requesting Consult: IM/ Enzo Bi, MD                 Reason for Consult: Hyponatremia            History of Present Illness: Patient is a 64 y.o. female with multiple medical problems, who was admitted to [facility] on 07/10/2019 for evaluation of confusion. Patient not able to provide meaningful information. Per ER notes, Patient's family called EMS due to her being unable to care for herself.  She lives alone.  She was found on the floor by EMS. Found to have critically low sodium at admission of 114 Last known Na 132 in 10/2018   Today, she feels fair.  Able to answer few questions.  Patient mentioning about a system that she has about does not know exactly where she lives [place], [place] or [place].  Asking about going home   Current medications: Current Facility-Administered Medications  Medication Dose Route Frequency Provider Last Rate Last Admin  . cholecalciferol (VITAMIN D3) tablet 500 Units  500 Units Oral Daily Ivor Costa, MD   500 Units at 07/14/19 0933  . enoxaparin (LOVENOX) injection 40 mg  40 mg Subcutaneous Q24H Ivor Costa, MD   40 mg at 07/14/19 6962  . ibuprofen (ADVIL) tablet 400 mg  400 mg Oral Q6H PRN Lorella Nimrod, MD   400 mg at 07/13/19 1049  . lactulose (CHRONULAC) 10 GM/15ML solution 30 g  30 g Oral TID Ivor Costa, MD   30 g at 07/14/19 0933  . methocarbamol (ROBAXIN) tablet 750 mg  750 mg Oral TID Ivor Costa, MD   750 mg at 07/14/19 0932  . metoprolol tartrate (LOPRESSOR) tablet 12.5 mg  12.5 mg Oral BID Enzo Bi, MD      . ondansetron [facility]) injection 4 mg  4 mg Intravenous Q8H PRN Ivor Costa, MD      . potassium chloride SA (KLOR-CON) CR tablet 40 mEq  40 mEq Oral Daily  Enzo Bi, MD      . sodium chloride tablet 1 g  1 g Oral BID WC Murlean Iba, MD   1 g at 07/14/19 0933  . torsemide (DEMADEX) tablet 5 mg  5 mg Oral Daily Murlean Iba, MD   5 mg at 07/14/19 9528      Allergies: Allergies  Allergen Reactions  . Vicodin [Hydrocodone-Acetaminophen] Other (See Comments) and Rash    nightmares nightmares Per patient she crawls all over the bed     Vital Signs: Blood pressure (!) 104/59, pulse 94, temperature 98.1 F (36.7 C), temperature source Oral, resp. rate 18, height 5' 5"  (1.651 m), weight 87 kg, SpO2 96 %.   Intake/Output Summary (Last 24 hours) at 07/14/2019 1415 Last data filed at 07/14/2019 0400 Gross per 24 hour  Intake -  Output 1150 ml  Net -1150 ml    Weight trends: Filed Weights   07/12/19 0618 07/13/19 0500 07/14/19 0411  Weight: 89.8 kg 87.4 kg 87 kg    Physical Exam: General:  NAD,  HEENT  moist oral mucous membranes  Neck:  Supple, no masses  Lungs:  Clear to auscultation  Heart::  Irregular rhythm  Abdomen:  Soft, nontender  Extremities:  no edema  Neurologic: Alert, able to answer few questions correctly  Skin: Bruise on left forearm    Lab results: Basic Metabolic Panel: Recent Labs  Lab 07/11/19 0440 07/11/19 0730 07/12/19 1918 07/13/19 0543 07/13/19 1222 07/14/19 0453  NA 128*   < > 126* 129*  --  132*  K 2.7*   < > 3.2* 2.9*  --  3.4*  CL 90*   < > 91* 93*  --  96*  CO2 28   < > 25 27  --  27  GLUCOSE 114*   < > 118* 117*  --  90  BUN <5*   < > 5* <5*  --  <5*  CREATININE 0.32*   < > 0.31* 0.31*  --  <0.30*  CALCIUM 8.3*   < > 8.7* 8.5*  --  8.4*  MG 2.2  --   --  1.7 2.6*  --    < > = values in this interval not displayed.    Liver Function Tests: Recent Labs  Lab 07/14/19 0453  AST 32  ALT 16  ALKPHOS 112  BILITOT 1.3*  PROT 5.5*  ALBUMIN 2.9*   No results for input(s): LIPASE, AMYLASE in the last 168 hours. Recent Labs  Lab 07/10/19 1034  AMMONIA 28    CBC: Recent  Labs  Lab 07/10/19 1034 07/10/19 1034 07/11/19 0440 07/13/19 0543  WBC 8.7   < > 7.0 7.7  NEUTROABS 7.0  --   --   --   HGB 11.1*   < > 10.7* 10.3*  HCT RESULTS UNAVAILABLE DUE TO INTERFERING SUBSTANCE   < > 28.6* 29.4*  MCV RESULTS UNAVAILABLE DUE TO INTERFERING SUBSTANCE   < > 83.1 90.2  PLT 217   < > 201 194   < > = values in this interval not displayed.    Cardiac Enzymes: No results for input(s): CKTOTAL, TROPONINI in the last 168 hours.  BNP: Invalid input(s): POCBNP  CBG: Recent Labs  Lab 07/13/19 1202 07/13/19 1652 07/13/19 2119 07/14/19 0741 07/14/19 1205  GLUCAP 114* 106* 101* 92 105*    Microbiology: Recent Results (from the past 720 hour(s))  SARS CORONAVIRUS 2 (TAT 6-24 HRS) Nasopharyngeal Nasopharyngeal Swab     Status: None   Collection Time: 07/10/19 10:34 AM   Specimen: Nasopharyngeal Swab  Result Value Ref Range Status   SARS Coronavirus 2 NEGATIVE NEGATIVE Final    Comment: (NOTE) SARS-CoV-2 target nucleic acids are NOT DETECTED. The SARS-CoV-2 RNA is generally detectable in upper and lower respiratory specimens during the acute phase of infection. Negative results do not preclude SARS-CoV-2 infection, do not rule out co-infections with other pathogens, and should not be used as the sole basis for treatment or other patient management decisions. Negative results must be combined with clinical observations, patient history, and epidemiological information. The expected result is Negative. Fact Sheet for Patients: SugarRoll.be Fact Sheet for Healthcare Providers: https://www.woods-mathews.com/ This test is not yet approved or cleared by the [place] FDA and  has been authorized for detection and/or diagnosis of SARS-CoV-2 by FDA under an Emergency Use Authorization (EUA). This EUA will remain  in effect (meaning this test can be used) for the duration of the COVID-19 declaration under Section 56  4(b)(1) of the Act, 21 U.S.C. section 360bbb-3(b)(1), unless  the authorization is terminated or revoked sooner. Performed at [facility], [place] [address], [place], [place] [address]      Coagulation Studies: Recent Labs    07/12/19 0347  LABPROT 14.4  INR 1.1    Urinalysis: No results for input(s): COLORURINE, LABSPEC, PHURINE, GLUCOSEU, HGBUR, BILIRUBINUR, KETONESUR, PROTEINUR, UROBILINOGEN, NITRITE, LEUKOCYTESUR in the last 72 hours.  Invalid input(s): APPERANCEUR      Imaging: No results found.   Assessment & Plan: Pt is a 64 y.o. Caucasian female with congestive heart failure, depression, diabetes, fibromyalgia, gout, hyperlipidemia, hypertension, cirrhosis, was admitted on 07/10/2019 with Acute hyponatremia [E87.1] Hyponatremia [E87.1] Acute encephalopathy [L29.57] Acute metabolic encephalopathy [M73.40]  #Hyponatremia, symptomatic with confusion Last known sodium of 132 in August 2020 suggesting chronic hyponatremia Comes in with acutely low level of 114.  Baseline mental status is unknown but appears to be confused at presentation.    Chronic hyponatremia is likely secondary to liver disease, with acute worsening from dehydration.  TSH normal at 1.186 Patient was treated with 3% saline initially.  It was discontinued when sodium increased from 114->128  Encourage oral intake of food and Ensure Patient has been started on sodium chloride tablets 1 g twice a day along with low-dose diuretic Improvement in sodium to 132  Continue to monitor closely.  #Hypokalemia Likely nutritional and due to diuresis Expected to improve with better nutrition Agree with oral supplementation   LOS: 4 Bethanne Mule 4/14/20212:15 PM    Note: This note was prepared with Dragon dictation. Any transcription errors are unintentional

## 2019-07-14 NOTE — TOC Progression Note (Addendum)
Transition of Care [facility]) - Progression Note    Patient Details  Name: [name] MRN: [id number] Date of Birth: [birth year]  Transition of Care [facility]) CM/SW [place], RN Phone Number: 07/14/2019, 3:08 PM  Clinical Narrative:    Marden Noble with the [facility] called and said they may send her to the [facility],  [facility] will not have a bed until 4/17, he will call back in a few min with a definate        Expected Discharge Plan and Services           Expected Discharge Date: 07/14/19                                     Social Determinants of Health (SDOH) Interventions    Readmission Risk Interventions No flowsheet data found.

## 2019-07-14 NOTE — Progress Notes (Signed)
PIV consult discussed with Gerald Stabs, RN. Site established though no she has no current IV meds ordered. If pt removes this IV, recommend waiting until an intravenous medication is needed/ ordered before replacing.

## 2019-07-14 NOTE — TOC Progression Note (Signed)
Transition of Care [facility]) - Progression Note    Patient Details  Name: Devine Dant MRN: [id number] Date of Birth: [birth year]  Transition of Care [facility]) CM/SW [place], RN Phone Number: 07/14/2019, 3:24 PM  Clinical Narrative:     Marden Noble with the [facility] called and stated that [facility] can take the patient but it will not be until tomorrow, the contact person at [facility] is Orpah Cobb her number is [phone], I notified the physician, I added [place] to the Hub and sent the referral and accepted the bed.  I called [facility] to change the facility to [facility], ref number [id number], auth id [id number], The stated that they had to start the auth process over that they could not change the faciity the new ref number is [id number], I refaxed clinical to [place] to [phone]        Expected Discharge Plan and Services           Expected Discharge Date: 07/14/19                                     Social Determinants of Health (SDOH) Interventions    Readmission Risk Interventions No flowsheet data found.

## 2019-07-14 NOTE — Discharge Summary (Addendum)
Physician Discharge Summary   [name]  female DOB: [birth year]  IHK:[id number]  PCP: [facility] [place] date: 07/10/2019 Discharge date: 07/15/2019  Admitted From: home Disposition:  SNF CODE STATUS: Full code   Hospital Course:  For full details, please see H&P, progress notes, consult notes and ancillary notes.  Briefly,  Jenna Holst Schaeferis a 64 y.o.femalewith medical history significant ofliver cirrhosis, hypertension, diabetes mellitus, gout, depression, IBS, fibromyalgia,dCHF, hernia, who presented with altered mental status.  Pre report, pt lived alone at home. She was noted to be confused, and was not able totake care of herself.  She was not taking her meds, not eating and drinking well. Per report, pt had soiled clothing.  Patient actually came in under IVC due to confusion.  Pt had had recent admissions for hyponatremia and UTI.  Acute on chronic hyponatremia, Improved  Last known sodium of 132 in August 2020 suggesting chronic hyponatremia.  Found to have sodium of 114 on presentation, started on 3% normal saline in ED, which was discontinued overnight due to overcorrection to 128.  Chronic hyponatremia is likely secondary to liver disease, with acute worsening from dehydration.  TSH normal at 1.186.  Nephrology was consulted and pt was started on sodium chloride tablets 1 g twice a day along with low-dose diuretic.  Pt will continue both until outpatient nephrology followup in 2-3 weeks.  Na 132 on the day of discharge.  Need to ensure adequate oral food and fluid intake.  Pt will need Na level check within 1 week after discharge.    Hypokalemia Likely nutritional and due to diuresis.  Expected to improve with better nutrition.   Potassium was repleted PRN while inpatient.  Pt was discharged on oral potassium 20 mEq daily for 30 days.  Pt will need potassium level check within 1 week after discharge.  Acute metabolic encephalopathy.    Maybe chronic, unclear baseline.  Patient is very pleasant but oriented to self and place, not time.  CT head negative.  Ammonia level within normal limit at 28.  UA negative.  Patient apparently lived alone and some family member found her on floor and called the EMS.  Daughter did not want to be involved.  Psych evaluation done and deemed pt not able to take care of herself.  Adult Protective Services were involved and took over this patient's custody.    Diabetes mellitus without complication ([place]) Most recent A1c5.1, well controled.   Liver cirrhosis (HCC) Ammonia level normal 28. INR 1.1.  Continued homelactulose to aim for 2-3 BM's per day.  Hx of HTN, not currently active Pt was not on BP meds PTA.  Blood pressure within goal.    Chronic diastolic CHF  2D echo on 7/56/4332 showed EF>55%.Patient has a trace leg edema. No respiratory distress. No pulmonary edema chest x-ray. NoCHF symptoms, appeared well compensated.BNP 114.  Patient was not taking diuretics PTA.  Pt was started on torsemide 5 mg daily plus salt tablets for hyponatremia.    Foot ulcer pt has a ulcer in right great toe, a very small lesion in left 2nd tod, and a scabbed lesion in the left plantar area. No active draining.  Afib, new dx No prior hx on record.  Pt has been in Afib with HR range between 80's-100's.  Pt was started on low dose metop 12.6 mg BID for rate control.   Discharge Diagnoses:  Principal Problem:   Acute metabolic encephalopathy Active Problems:   Diabetes  mellitus without complication (HCC)   Hyponatremia   Liver cirrhosis (HCC)   Hypertension   Chronic diastolic CHF (congestive heart failure) (HCC)   Hypokalemia    Discharge Instructions:  Allergies as of 07/14/2019      Reactions   Vicodin [hydrocodone-acetaminophen] Other (See Comments), Rash   nightmares nightmares Per patient she crawls all over the bed      Medication List    STOP taking these medications    meloxicam 15 MG tablet Commonly known as: MOBIC     TAKE these medications   acetaminophen 500 MG tablet Commonly known as: TYLENOL Take 1 tablet (500 mg total) by mouth every 8 (eight) hours as needed for mild pain or moderate pain. What changed:   when to take this  reasons to take this   cholecalciferol 10 MCG (400 UNIT) Tabs tablet Commonly known as: VITAMIN D3 Take 400 Units by mouth daily.   lactulose 10 GM/15ML solution Commonly known as: CHRONULAC Take 45 mLs (30 g total) by mouth 3 (three) times daily. Titrate to 2-3 BM's daily. What changed: additional instructions   methocarbamol 750 MG tablet Commonly known as: ROBAXIN Take 1 tablet (750 mg total) by mouth 3 (three) times daily as needed for muscle spasms. What changed:   when to take this  reasons to take this   metoprolol tartrate 25 MG tablet Commonly known as: LOPRESSOR Take 0.5 tablets (12.5 mg total) by mouth 2 (two) times daily.   potassium chloride SA 20 MEQ tablet Commonly known as: KLOR-CON Take 1 tablet (20 mEq total) by mouth daily.   sodium chloride 1 g tablet Take 1 tablet (1 g total) by mouth 2 (two) times daily with a meal.   torsemide 5 MG tablet Commonly known as: DEMADEX Take 1 tablet (5 mg total) by mouth daily. Start taking on: July 15, 2019        Contact information for follow-up providers    Murlean Iba, MD. Schedule an appointment as soon as possible for a visit in 2 week(s).   Specialty: Nephrology Contact information:  [place] [address] [address], [facility]. Schedule an appointment as soon as possible for a visit in 1 week(s).   Specialty: General Practice Contact information: [place] [address] [phone]            Contact information for after-discharge care    [facility] .   Service: Skilled Nursing Contact  information: [address] [place] [phone]                  Allergies  Allergen Reactions  . Vicodin [Hydrocodone-Acetaminophen] Other (See Comments) and Rash    nightmares nightmares Per patient she crawls all over the bed     The results of significant diagnostics from this hospitalization (including imaging, microbiology, ancillary and laboratory) are listed below for reference.   Consultations:   Procedures/Studies: DG Chest 1 View  Result Date: 07/10/2019 CLINICAL DATA:  Altered mental status EXAM: CHEST  1 VIEW COMPARISON:  11/10/2018 FINDINGS: Chronic right pleural thickening and right basilar atelectasis with volume loss in the right hemithorax. No new consolidation or edema. No pneumothorax. Stable cardiomediastinal contours IMPRESSION: No acute process in the chest. Electronically Signed   By: Macy Mis M.D.   On: 07/10/2019 10:57   CT Head Wo Contrast  Result Date: 07/10/2019  CLINICAL DATA:  Fall, laceration to right parietal area EXAM: CT HEAD WITHOUT CONTRAST TECHNIQUE: Contiguous axial images were obtained from the base of the skull through the vertex without intravenous contrast. COMPARISON:  None. FINDINGS: Brain: There is no acute intracranial hemorrhage, mass effect, or edema. Gray-white differentiation is preserved. There is no extra-axial fluid collection. Ventricles and sulci are within normal limits in size and configuration. Minimal patchy hypoattenuation in the supratentorial white matter is nonspecific but may reflect minor chronic microvascular ischemic changes. Vascular: No hyperdense vessel or unexpected calcification. Skull: Calvarium is unremarkable. Sinuses/Orbits: No acute finding. Other: None. IMPRESSION: No evidence of acute intracranial injury. Electronically Signed   By: Macy Mis M.D.   On: 07/10/2019 10:53      Labs: BNP (last 3 results) Recent Labs    07/10/19 1034  BNP 114.0*   Basic  Metabolic Panel: Recent Labs  Lab 07/10/19 1034 07/10/19 1747 07/11/19 0440 07/11/19 0730 07/12/19 1124 07/12/19 1524 07/12/19 1918 07/13/19 0543 07/13/19 1222 07/14/19 0453  NA 114*  114*   < > 128*   < > 126* 127* 126* 129*  --  132*  K 2.6*  --  2.7*   < > 3.4* 3.7 3.2* 2.9*  --  3.4*  CL 73*  --  90*   < > 92* 93* 91* 93*  --  96*  CO2 29  --  28   < > 26 26 25 27   --  27  GLUCOSE 137*  --  114*   < > 114* 125* 118* 117*  --  90  BUN <5*  --  <5*   < > 5* 5* 5* <5*  --  <5*  CREATININE 0.34*  --  0.32*   < > 0.47 0.42* 0.31* 0.31*  --  <0.30*  CALCIUM 8.3*  --  8.3*   < > 8.3* 8.8* 8.7* 8.5*  --  8.4*  MG 1.7  --  2.2  --   --   --   --  1.7 2.6*  --    < > = values in this interval not displayed.   Liver Function Tests: Recent Labs  Lab 07/10/19 1034 07/12/19 0347 07/14/19 0453  AST 54* 35 32  ALT 26 21 16   ALKPHOS 133* 112 112  BILITOT 2.8* 1.7* 1.3*  PROT 6.0* 5.2* 5.5*  ALBUMIN 3.2* 2.8* 2.9*   No results for input(s): LIPASE, AMYLASE in the last 168 hours. Recent Labs  Lab 07/10/19 1034  AMMONIA 28   CBC: Recent Labs  Lab 07/10/19 1034 07/11/19 0440 07/13/19 0543  WBC 8.7 7.0 7.7  NEUTROABS 7.0  --   --   HGB 11.1* 10.7* 10.3*  HCT RESULTS UNAVAILABLE DUE TO INTERFERING SUBSTANCE 28.6* 29.4*  MCV RESULTS UNAVAILABLE DUE TO INTERFERING SUBSTANCE 83.1 90.2  PLT 217 201 194   Cardiac Enzymes: No results for input(s): CKTOTAL, CKMB, CKMBINDEX, TROPONINI in the last 168 hours. BNP: Invalid input(s): POCBNP CBG: Recent Labs  Lab 07/13/19 1202 07/13/19 1652 07/13/19 2119 07/14/19 0741 07/14/19 1205  GLUCAP 114* 106* 101* 92 105*   D-Dimer No results for input(s): DDIMER in the last 72 hours. Hgb A1c No results for input(s): HGBA1C in the last 72 hours. Lipid Profile No results for input(s): CHOL, HDL, LDLCALC, TRIG, CHOLHDL, LDLDIRECT in the last 72 hours. Thyroid function studies No results for input(s): TSH, T4TOTAL, T3FREE, THYROIDAB  in the last 72 hours.  Invalid input(s): FREET3 Anemia work up No results for  input(s): VITAMINB12, FOLATE, FERRITIN, TIBC, IRON, RETICCTPCT in the last 72 hours. Urinalysis    Component Value Date/Time   COLORURINE YELLOW (A) 07/10/2019 1034   APPEARANCEUR CLEAR (A) 07/10/2019 1034   APPEARANCEUR Hazy 05/26/2013 1912   LABSPEC 1.002 (L) 07/10/2019 1034   LABSPEC 1.024 05/26/2013 1912   PHURINE 6.0 07/10/2019 1034   GLUCOSEU NEGATIVE 07/10/2019 1034   GLUCOSEU Negative 05/26/2013 1912   HGBUR NEGATIVE 07/10/2019 1034   BILIRUBINUR NEGATIVE 07/10/2019 1034   BILIRUBINUR 1+ 05/26/2013 1912   KETONESUR 5 (A) 07/10/2019 [place] 07/10/2019 1034   NITRITE NEGATIVE 07/10/2019 1034   LEUKOCYTESUR SMALL (A) 07/10/2019 1034   LEUKOCYTESUR Negative 05/26/2013 1912   Sepsis Labs Invalid input(s): PROCALCITONIN,  WBC,  LACTICIDVEN Microbiology Recent Results (from the past 240 hour(s))  SARS CORONAVIRUS 2 (TAT 6-24 HRS) Nasopharyngeal Nasopharyngeal Swab     Status: None   Collection Time: 07/10/19 10:34 AM   Specimen: Nasopharyngeal Swab  Result Value Ref Range Status   SARS Coronavirus 2 NEGATIVE NEGATIVE Final    Comment: (NOTE) SARS-CoV-2 target nucleic acids are NOT DETECTED. The SARS-CoV-2 RNA is generally detectable in upper and lower respiratory specimens during the acute phase of infection. Negative results do not preclude SARS-CoV-2 infection, do not rule out co-infections with other pathogens, and should not be used as the sole basis for treatment or other patient management decisions. Negative results must be combined with clinical observations, patient history, and epidemiological information. The expected result is Negative. Fact Sheet for Patients: SugarRoll.be Fact Sheet for Healthcare Providers: https://www.woods-mathews.com/ This test is not yet approved or cleared by the [place] FDA and  has been  authorized for detection and/or diagnosis of SARS-CoV-2 by FDA under an Emergency Use Authorization (EUA). This EUA will remain  in effect (meaning this test can be used) for the duration of the COVID-19 declaration under Section 56 4(b)(1) of the Act, 21 U.S.C. section 360bbb-3(b)(1), unless the authorization is terminated or revoked sooner. Performed at [facility], [place] [address], [place], [place] [address]      Total time spend on discharging this patient, including the last patient exam, discussing the hospital stay, instructions for ongoing care as it relates to all pertinent caregivers, as well as preparing the medical discharge records, prescriptions, and/or referrals as applicable, is 30 minutes.    Enzo Bi, MD  [facility] 07/14/2019, 2:00 PM  If 7PM-7AM, please contact night-coverage

## 2019-07-15 LAB — MAGNESIUM: Magnesium: 1.9 mg/dL (ref 1.7–2.4)

## 2019-07-15 LAB — BASIC METABOLIC PANEL
Anion gap: 7 (ref 5–15)
BUN: <5 mg/dL — ABNORMAL LOW (ref 8–23)
CO2: 26 mmol/L (ref 22–32)
Calcium: 8.9 mg/dL (ref 8.9–10.3)
Chloride: 97 mmol/L — ABNORMAL LOW (ref 98–111)
Creatinine, Ser: 0.37 mg/dL — ABNORMAL LOW (ref 0.44–1.00)
GFR calc Af Amer: >60 mL/min (ref >60)
GFR calc non Af Amer: >60 mL/min (ref >60)
Glucose, Bld: 107 mg/dL — ABNORMAL HIGH (ref 70–99)
Potassium: 3.6 mmol/L (ref 3.5–5.1)
Sodium: 130 mmol/L — ABNORMAL LOW (ref 135–145)

## 2019-07-15 LAB — CBC
HCT: 29.5 % — ABNORMAL LOW (ref 36.0–46.0)
Hemoglobin: 10.3 g/dL — ABNORMAL LOW (ref 12.0–15.0)
MCH: 31.1 pg (ref 26.0–34.0)
MCHC: 34.9 g/dL (ref 30.0–36.0)
MCV: 89.1 fL (ref 80.0–100.0)
Platelets: 179 10*3/uL (ref 150–400)
RBC: 3.31 MIL/uL — ABNORMAL LOW (ref 3.87–5.11)
RDW: 15.4 % (ref 11.5–15.5)
WBC: 6.4 10*3/uL (ref 4.0–10.5)
nRBC: 0 % (ref 0.0–0.2)

## 2019-07-15 LAB — GLUCOSE, CAPILLARY
Glucose-Capillary: 99 mg/dL (ref 70–99)
Glucose-Capillary: 109 mg/dL — ABNORMAL HIGH (ref 70–99)

## 2019-07-15 NOTE — TOC Progression Note (Signed)
Transition of Care [facility]) - Progression Note    Patient Details  Name: Ellamay Fors MRN: [id number] Date of Birth: Feb 06, 1956  Transition of Care [facility]) CM/SW [place], RN Phone Number: 07/15/2019, 8:29 AM  Clinical Narrative:    Received a call from [facility] with new auth approval, ref number [id number], Auth ID [id number], Next review date 4/16 Care Coordinator Shawn Key I called Orpah Cobb at the [facility] in [place] to provide insurance information at [phone], Also provided the information for the [place] phone number [phone], I notified the physician that Josem Kaufmann was received and she has a bed ready, she will go to bed 520 bed 2, Bedside nurse to call report to [phone]       Expected Discharge Plan and Services           Expected Discharge Date: 07/14/19                                     Social Determinants of Health (SDOH) Interventions    Readmission Risk Interventions No flowsheet data found.

## 2019-07-15 NOTE — TOC Transition Note (Signed)
Transition of Care [facility]) - CM/SW Discharge Note   Patient Details  Name: [name] MRN: [id number] Date of Birth: [birth year]  Transition of Care [facility]) CM/SW Contact:  Su Hilt, RN Phone Number: 07/15/2019, 11:31 AM   Clinical Narrative:     Patient to discharge to [facility] in [place] bed 2 I called Brayton Layman with APS and notified her, The patient to transport via EMS and they have been called by [facility] Bedside nurse has called report  Final next level of care: [place] Barriers to Discharge: Barriers Resolved   Patient Goals and CMS Choice        Discharge Placement              Patient chooses bed at: [facility] Patient to be transferred to facility by: ems Name of family member notified: [facility] Patient and family notified of of transfer: 07/15/19  Discharge Plan and Services                                     Social Determinants of Health (SDOH) Interventions     Readmission Risk Interventions No flowsheet data found.

## 2019-07-15 NOTE — Plan of Care (Signed)
Patient discharged to [facility] per MD order. Report called to [place] at facility. EMS will transport.

## 2022-06-28 ENCOUNTER — Encounter: Payer: Self-pay | Admitting: Internal Medicine

## 2022-06-28 ENCOUNTER — Ambulatory Visit: Payer: Medicare Other | Attending: Internal Medicine | Admitting: Internal Medicine

## 2022-06-28 VITALS — BP 98/62 | HR 85 | Ht 65.0 in | Wt 180.6 lb

## 2022-06-28 DIAGNOSIS — I319 Disease of pericardium, unspecified: Secondary | ICD-10-CM | POA: Diagnosis not present

## 2022-06-28 DIAGNOSIS — I2510 Atherosclerotic heart disease of native coronary artery without angina pectoris: Secondary | ICD-10-CM

## 2022-06-28 DIAGNOSIS — I4891 Unspecified atrial fibrillation: Secondary | ICD-10-CM | POA: Diagnosis not present

## 2022-06-28 DIAGNOSIS — I2584 Coronary atherosclerosis due to calcified coronary lesion: Secondary | ICD-10-CM

## 2022-06-28 DIAGNOSIS — G4733 Obstructive sleep apnea (adult) (pediatric): Secondary | ICD-10-CM

## 2022-06-28 DIAGNOSIS — R5381 Other malaise: Secondary | ICD-10-CM

## 2022-06-28 MED ORDER — METOPROLOL TARTRATE 25 MG PO TABS: 25.0000 mg | ORAL_TABLET | Freq: Two times a day (BID) | ORAL | 3 refills | Status: AC

## 2022-06-28 MED ORDER — APIXABAN 5 MG PO TABS: 5.0000 mg | ORAL_TABLET | Freq: Two times a day (BID) | ORAL | 3 refills | Status: AC

## 2022-06-28 NOTE — Patient Instructions (Addendum)
Medication Instructions:  Begin Metoprolol Tart (Lopressor) 25mg  twice a day   Begin Eliquis 5mg  twice a day   Stop Entresto  Continue all other medications.    Labwork: none  Testing/Procedures: Your physician has requested that you have an echocardiogram. Echocardiography is a painless test that uses sound waves to create images of your heart. It provides your doctor with information about the size and shape of your heart and how well your heart's chambers and valves are working. This procedure takes approximately one hour. There are no restrictions for this procedure. Please do NOT wear cologne, perfume, aftershave, or lotions (deodorant is allowed). Please arrive 15 minutes prior to your appointment time. Office will contact with results via phone, letter or mychart.     Follow-Up: Your physician wants you to follow up in:  1 year.  You should receive a recall letter in the mail about 2 months prior to the time you are due.  If you don't receive this, please call our office to schedule your follow up appointment.       Any Other Special Instructions Will Be Listed Below (If Applicable). You have been referred to:  Pulmonology    If you need a refill on your cardiac medications before your next appointment, please call your pharmacy.

## 2022-06-28 NOTE — Progress Notes (Addendum)
Cardiology Office Note  Date: 06/28/2022   ID: [name], DOB Jul 20, 1955, MRN [id number]  PCP:  [facility], [facility]  Cardiologist:  None Electrophysiologist:  None   Reason for Office Visit: Evaluation of coronary calcifications and pericardial thickening at the request of Ejindu, NP   History of Present Illness: [name] is a 67 y.o. female known to have persistent A-fib, DM 2, HLD was referred to cardiology clinic for evaluation of coronary calcifications and pericardial thickening. From facility, accompanied by transportation worker.  Per review of EKGs in the EMR, patient has been in atrial fibrillation since 2021. She is not on rate controlling agents or anticoagulation. She was never tested for OSA. Patient ambulates in a wheelchair but has no clear indication why she is in the wheelchair. No prior history of stroke. Denies any symptoms of angina, DOE, dizziness, lightheadedness, syncope, palpitations. Compliant with medications and has no side effects. Denies smoking cigarettes. No prior history of MI/PCI/CABG.  Past Medical History:  Diagnosis Date   Abdominal hernia    Allergy    Anemia    Anginal pain (HCC)    CHF (congestive heart failure) (HCC)    Colonic polyp    DDD (degenerative disc disease), cervical ddd   Depression    Diabetes mellitus without complication (HCC)    Fibromyalgia    Gout    Hyperlipidemia    Hypertension    IBS (irritable bowel syndrome)    Liver cirrhosis ([place])    Polyneuropathy     Past Surgical History:  Procedure Laterality Date   CERVICAL DISC SURGERY     CESAREAN SECTION  [id number]   CHOLECYSTECTOMY  2015   COLONOSCOPY W/ BIOPSIES AND POLYPECTOMY  06/10/2013   ESOPHAGOGASTRODUODENOSCOPY     ESOPHAGOGASTRODUODENOSCOPY (EGD) WITH PROPOFOL N/A 06/27/2017   Procedure: ESOPHAGOGASTRODUODENOSCOPY (EGD) WITH PROPOFOL;  Surgeon: Lollie Sails, MD;  Location: [facility] ENDOSCOPY;  Service: Endoscopy;   Laterality: N/A;   HERNIA REPAIR  2015   KNEE CARTILAGE SURGERY Right    partial amputation     toe surgery   SPINE SURGERY     TOE AMPUTATION Right 2015   partial amputation 3rd toe   TONSILLECTOMY  [id number]   UMBILICAL HERNIA REPAIR      Current Outpatient Medications  Medication Sig Dispense Refill   acetaminophen (TYLENOL) 500 MG tablet Take 1 tablet (500 mg total) by mouth every 8 (eight) hours as needed for mild pain or moderate pain. (Patient taking differently: Take 1,000 mg by mouth every 8 (eight) hours as needed for mild pain or moderate pain.) 30 tablet 0   allopurinol (ZYLOPRIM) 100 MG tablet Take 100 mg by mouth 2 (two) times daily.     apixaban (ELIQUIS) 5 MG TABS tablet Take 1 tablet (5 mg total) by mouth 2 (two) times daily. 180 tablet 3   ascorbic acid (VITAMIN C) 500 MG tablet Take 500 mg by mouth 2 (two) times daily.     busPIRone (BUSPAR) 5 MG tablet Take 5 mg by mouth 2 (two) times daily.     Cholecalciferol 25 MCG (1000 UT) tablet Take 1,000 Units by mouth daily.     cyanocobalamin (VITAMIN B12) 500 MCG tablet Take 500 mcg by mouth daily.     cyclobenzaprine (FLEXERIL) 5 MG tablet Take 5 mg by mouth every 8 (eight) hours as needed for muscle spasms.     ferrous sulfate 324 MG TBEC Take 324 mg by mouth in the morning and  at bedtime.     folic acid (FOLATE) A999333 MCG tablet Take 400 mcg by mouth daily.     gabapentin (NEURONTIN) 400 MG capsule Take 400 mg by mouth 3 (three) times daily.     lactulose (CHRONULAC) 10 GM/15ML solution Take 45 mLs (30 g total) by mouth 3 (three) times daily. Titrate to 2-3 BM's daily. (Patient taking differently: Take 10 g by mouth daily. Titrate to 2-3 BM's daily.) 236 mL 0   melatonin 3 MG TABS tablet Take 6 mg by mouth at bedtime.     mirabegron ER (MYRBETRIQ) 25 MG TB24 tablet Take 25 mg by mouth daily.     polyethylene glycol (MIRALAX / GLYCOLAX) 17 g packet Take 17 g by mouth daily.     potassium chloride SA (KLOR-CON) 20 MEQ tablet Take  1 tablet (20 mEq total) by mouth daily. 30 tablet 0   torsemide (DEMADEX) 5 MG tablet Take 1 tablet (5 mg total) by mouth daily. (Patient taking differently: Take 20 mg by mouth 2 (two) times daily.)     methocarbamol (ROBAXIN) 750 MG tablet Take 1 tablet (750 mg total) by mouth 3 (three) times daily as needed for muscle spasms. (Patient not taking: Reported on 06/28/2022)     metoprolol tartrate (LOPRESSOR) 25 MG tablet Take 1 tablet (25 mg total) by mouth 2 (two) times daily. 180 tablet 3   sodium chloride 1 g tablet Take 1 tablet (1 g total) by mouth 2 (two) times daily with a meal. (Patient not taking: Reported on 06/28/2022)     No current facility-administered medications for this visit.   Allergies:  Vicodin [hydrocodone-acetaminophen]   Social History: The patient  reports that she has never smoked. She has never been exposed to tobacco smoke. She has never used smokeless tobacco. She reports that she does not drink alcohol and does not use drugs.   Family History: The patient's family history includes Alcohol abuse in her father and mother; Arthritis in her mother; Colon polyps in her sister; Depression in her father and mother; Diabetes in her mother; Heart disease in her maternal aunt; Hypertension in her mother; Mental illness in her father and mother.   ROS:  Please see the history of present illness. Otherwise, complete review of systems is positive for none.  All other systems are reviewed and negative.   Physical Exam: VS:  BP 98/62 (BP Location: Right Arm, Patient Position: Sitting, Cuff Size: Normal)   Pulse 85   Ht 5\' 5"  (1.651 m)   Wt 180 lb 9.6 oz (81.9 kg)   SpO2 98%   BMI 30.05 kg/m , BMI Body mass index is 30.05 kg/m.  Wt Readings from Last 3 Encounters:  06/28/22 180 lb 9.6 oz (81.9 kg)  07/14/19 191 lb 11.2 oz (87 kg)  11/10/18 232 lb 9.4 oz (105.5 kg)    General: Patient appears comfortable at rest. HEENT: Conjunctiva and lids normal, oropharynx clear with  moist mucosa. Neck: Supple, no elevated JVP or carotid bruits, no thyromegaly. Lungs: Clear to auscultation, nonlabored breathing at rest. Cardiac: Regular rate and rhythm, no S3 or significant systolic murmur, no pericardial rub. Abdomen: Soft, nontender, no hepatomegaly, bowel sounds present, no guarding or rebound. Extremities: No pitting edema, distal pulses 2+. Skin: Warm and dry. Musculoskeletal: No kyphosis. Neuropsychiatric: Alert and oriented x3, affect grossly appropriate.  ECG:  An ECG dated 06/28/2022 was personally reviewed today and demonstrated:  Atrial fibrillation, rate controlled  Recent Labwork: No results found for requested  labs within last 365 days.  No results found for: "CHOL", "TRIG", "HDL", "CHOLHDL", "VLDL", "LDLCALC", "LDLDIRECT"  Other Studies Reviewed Today: Echocardiogram from 2017 Normal LVEF Mild TR  Assessment and Plan: Patient is a 67 year old F known to have persistent A-fib, DM 2 was referred to cardiology clinic for evaluation of coronary calcifications.  # Imaging evidence of coronary artery calcifications and pericardial thickening -Patient underwent CT chest without contrast in 05/2022 due to abnormal chest x-ray.  CT chest showed severe progressive pericardial thickening and calcification with imaging findings indicative of constrictive physiology as well as presence of calcifications in the left main and three-vessel coronary arteries. Severity of the calcifications was not documented. Patient does not ambulate much in the facility, has no symptoms of angina or DOE. Will monitor for any symptoms of angina or DOE in the future and obtain required workup. For now, will schedule for 2D echocardiogram.  # Persistent A-fib -Start metoprolol tartrate 20 mg twice daily (discontinue Entresto) -Start Eliquis 5 mg twice daily -Obtain 2D echocardiogram -Pulmonary referral for OSA evaluation  # Deconditioning: Physical therapy  I have spent a total of  45 minutes with patient reviewing chart, EKGs, labs and examining patient as well as establishing an assessment and plan that was discussed with the patient.  > 50% of time was spent in direct patient care.      Medication Adjustments/Labs and Tests Ordered: Current medicines are reviewed at length with the patient today.  Concerns regarding medicines are outlined above.   Tests Ordered: Orders Placed This Encounter  Procedures   Ambulatory referral to Pulmonology   EKG 12-Lead   ECHOCARDIOGRAM COMPLETE    Medication Changes: Meds ordered this encounter  Medications   metoprolol tartrate (LOPRESSOR) 25 MG tablet    Sig: Take 1 tablet (25 mg total) by mouth 2 (two) times daily.    Dispense:  180 tablet    Refill:  3   apixaban (ELIQUIS) 5 MG TABS tablet    Sig: Take 1 tablet (5 mg total) by mouth 2 (two) times daily.    Dispense:  180 tablet    Refill:  3    New 06/28/2022    Disposition:  Follow up  6 months  Signed Consepcion Utt Fidel Levy, MD, 06/28/2022 2:01 PM    [place] [facility] [place], [place], [place] [address]

## 2022-07-05 ENCOUNTER — Telehealth: Payer: Self-pay | Admitting: Internal Medicine

## 2022-07-05 NOTE — Telephone Encounter (Signed)
Caller stated she is following-up on a referral she received for this patient.

## 2022-07-05 NOTE — Telephone Encounter (Signed)
Left message to return call 

## 2022-07-08 NOTE — Telephone Encounter (Signed)
2nd attempt - no answer

## 2022-07-10 NOTE — Telephone Encounter (Signed)
Spoke with Herbert Seta at [facility] and did not really know why she would have called regarding this patient. Informed her that if she remembers, she can call back to the office.

## 2022-07-24 ENCOUNTER — Ambulatory Visit: Payer: Medicare Other | Attending: Internal Medicine

## 2022-07-24 DIAGNOSIS — I4891 Unspecified atrial fibrillation: Secondary | ICD-10-CM | POA: Diagnosis not present

## 2022-07-24 MED ORDER — PERFLUTREN LIPID MICROSPHERE
1.0000 mL | INTRAVENOUS | Status: AC | PRN
  Administered 2022-07-24: 2 mL via INTRAVENOUS

## 2022-07-25 LAB — ECHOCARDIOGRAM COMPLETE
AR max vel: 2.22 cm2
AV Area VTI: 2.28 cm2
AV Area mean vel: 2.73 cm2
AV Mean grad: 1 mmHg
AV Peak grad: 2.8 mmHg
Ao pk vel: 0.84 m/s
Area-P 1/2: 4.57 cm2
Calc EF: 62.7 %
MV M vel: 2.59 m/s
MV Peak grad: 26.8 mmHg
S' Lateral: 1.9 cm
Single Plane A2C EF: 64.9 %
Single Plane A4C EF: 56.1 %

## 2022-07-30 ENCOUNTER — Telehealth: Payer: Self-pay | Admitting: *Deleted

## 2022-07-30 MED ORDER — TORSEMIDE 20 MG PO TABS: 20.0000 mg | ORAL_TABLET | Freq: Two times a day (BID) | ORAL | 3 refills | Status: DC

## 2022-07-30 NOTE — Telephone Encounter (Signed)
Alinda Dooms LPN for patient informed and verbalized understanding of plan. Appointment scheduled with [facility] staff for 08/11/2021 @1 :30 pm Sharlene Dory NP.  Faxed orders to [phone]

## 2022-07-30 NOTE — Telephone Encounter (Signed)
-----   Message from Marjo Bicker, MD sent at 07/26/2022  4:03 PM EDT ----- The pumping function of the right side of the heart is decreased. LVEF is normal. Inferior vena cava is dilated indicating elevated central venous pressures. Please schedule follow-up appointment in 1 to 2 weeks for volume assessment (with APP or MD). Currently on torsemide, can take additional dose if SOB. She might benefit from cardiac MRI for further evaluation of possible pericardial constriction.

## 2022-08-12 ENCOUNTER — Encounter: Payer: Self-pay | Admitting: Nurse Practitioner

## 2022-08-12 ENCOUNTER — Ambulatory Visit: Payer: Medicare Other | Attending: Nurse Practitioner | Admitting: Nurse Practitioner

## 2022-08-12 VITALS — BP 98/60 | HR 80 | Ht 65.0 in | Wt 216.0 lb

## 2022-08-12 DIAGNOSIS — R600 Localized edema: Secondary | ICD-10-CM | POA: Diagnosis not present

## 2022-08-12 DIAGNOSIS — Z79899 Other long term (current) drug therapy: Secondary | ICD-10-CM

## 2022-08-12 DIAGNOSIS — I5033 Acute on chronic diastolic (congestive) heart failure: Secondary | ICD-10-CM | POA: Diagnosis not present

## 2022-08-12 DIAGNOSIS — I5020 Unspecified systolic (congestive) heart failure: Secondary | ICD-10-CM

## 2022-08-12 DIAGNOSIS — E669 Obesity, unspecified: Secondary | ICD-10-CM

## 2022-08-12 DIAGNOSIS — E785 Hyperlipidemia, unspecified: Secondary | ICD-10-CM

## 2022-08-12 DIAGNOSIS — I272 Pulmonary hypertension, unspecified: Secondary | ICD-10-CM

## 2022-08-12 DIAGNOSIS — I4891 Unspecified atrial fibrillation: Secondary | ICD-10-CM | POA: Diagnosis not present

## 2022-08-12 NOTE — Patient Instructions (Addendum)
Medication Instructions:  Your physician recommends that you continue on your current medications as directed. Please refer to the Current Medication list given to you today.  Labwork: BMET-08/12/2022 Please have drawn at [facility] ASAP and fax results to [phone]  Testing/Procedures: none  Follow-Up: Your physician recommends that you schedule a follow-up appointment in: 3-4 weeks  Any Other Special Instructions Will Be Listed Below (If Applicable). Please restrict fluid intake to no more than 1500 ml per day   If you need a refill on your cardiac medications before your next appointment, please call your pharmacy.

## 2022-08-12 NOTE — Progress Notes (Unsigned)
Office Visit    Patient Name: [name] Date of Encounter: 08/12/2022  PCP:  Center, [facility]   [facility]  Cardiologist:  Marjo Bicker, MD *** Advanced Practice Provider:  No care team member to display Electrophysiologist:  None  {Press F2 to show EP APP, CHF, sleep or structural heart MD               :[id number]}  { Click here to update then REFRESH NOTE - MD (PCP) or APP (Team Member)  Change PCP Type for MD, Specialty for APP is either Cardiology or Clinical Cardiac Electrophysiology  :[id number]}  Chief Complaint    [name] is a 67 y.o. female with a hx of persistent A-fib, hyperlipidemia, and type 2 diabetes, who presents today for follow-up based on recent echocardiogram result.  Past Medical History    Past Medical History:  Diagnosis Date   Abdominal hernia    Allergy    Anemia    Anginal pain (HCC)    CHF (congestive heart failure) (HCC)    Colonic polyp    DDD (degenerative disc disease), cervical ddd   Depression    Diabetes mellitus without complication (HCC)    Fibromyalgia    Gout    Hyperlipidemia    Hypertension    IBS (irritable bowel syndrome)    Liver cirrhosis (HCC)    Polyneuropathy    Past Surgical History:  Procedure Laterality Date   CERVICAL DISC SURGERY     CESAREAN SECTION  [id number]   CHOLECYSTECTOMY  2015   COLONOSCOPY W/ BIOPSIES AND POLYPECTOMY  06/10/2013   ESOPHAGOGASTRODUODENOSCOPY     ESOPHAGOGASTRODUODENOSCOPY (EGD) WITH PROPOFOL N/A 06/27/2017   Procedure: ESOPHAGOGASTRODUODENOSCOPY (EGD) WITH PROPOFOL;  Surgeon: Christena Deem, MD;  Location: [facility] ENDOSCOPY;  Service: Endoscopy;  Laterality: N/A;   HERNIA REPAIR  2015   KNEE CARTILAGE SURGERY Right    partial amputation     toe surgery   SPINE SURGERY     TOE AMPUTATION Right 2015   partial amputation 3rd toe   TONSILLECTOMY  1987   UMBILICAL HERNIA REPAIR      Allergies  Allergies  Allergen  Reactions   Vicodin [Hydrocodone-Acetaminophen] Other (See Comments) and Rash    nightmares nightmares Per patient she crawls all over the bed    History of Present Illness    [name] is a 67 y.o. female with a PMH as mentioned above.  History of atrial fibrillation since 2021.  Last seen by Dr. Jenene Slicker on June 28, 2022.  Was referred for evaluation of coronary calcifications of pericardial thickening noted on CT scan.  CT showed severe progressive pericardial thickening, indicative of constrictive physiology.  Coronary artery calcium calcification severity was not documented.  Was transported from facility by [employer].  Overall was doing well from a cardiac perspective. Patient was unsure why she is in a wheelchair.  Never tested for OSA or on anticoagulation or rate controlling agents for A-fib. Was started on metoprolol to tartrate 25 mg twice daily, Eliquis 5 mg twice daily.  Was referred to pulmonology for OSA evaluation.  Echocardiogram was obtained and revealed results noted below.  Today she presents for follow-up based on recent Echo results.  She states   EKGs/Labs/Other Studies Reviewed:   The following studies were reviewed today: ***  EKG:  EKG is *** ordered today.  The ekg ordered today demonstrates ***  Recent Labs: No results found for  requested labs within last 365 days.  Recent Lipid Panel No results found for: "CHOL", "TRIG", "HDL", "CHOLHDL", "VLDL", "LDLCALC", "LDLDIRECT"  Risk Assessment/Calculations:  {Does this patient have ATRIAL FIBRILLATION?:[phone]}  Home Medications   No outpatient medications have been marked as taking for the 08/12/22 encounter (Appointment) with Sharlene Dory, NP.     Review of Systems   ***   All other systems reviewed and are otherwise negative except as noted above.  Physical Exam    VS:  There were no vitals taken for this visit. , BMI There is no height or weight on file to calculate  BMI.  Wt Readings from Last 3 Encounters:  06/28/22 180 lb 9.6 oz (81.9 kg)  07/14/19 191 lb 11.2 oz (87 kg)  11/10/18 232 lb 9.4 oz (105.5 kg)     GEN: Well nourished, well developed, in no acute distress. HEENT: normal. Neck: Supple, no JVD, carotid bruits, or masses. Cardiac: ***RRR, no murmurs, rubs, or gallops. No clubbing, cyanosis, edema.  ***Radials/PT 2+ and equal bilaterally.  Respiratory:  ***Respirations regular and unlabored, clear to auscultation bilaterally. GI: Soft, nontender, nondistended. MS: No deformity or atrophy. Skin: Warm and dry, no rash. Neuro:  Strength and sensation are intact. Psych: Normal affect.  Assessment & Plan    ***  {Are you ordering a CV Procedure (e.g. stress test, cath, DCCV, TEE, etc)?   Press F2        :[id number]}      Disposition: Follow up {follow up:15908} with Vishnu P Mallipeddi, MD or APP.  Signed, Sharlene Dory, NP 08/12/2022, 12:48 PM [facility]

## 2022-08-15 ENCOUNTER — Telehealth: Payer: Self-pay | Admitting: Internal Medicine

## 2022-08-15 ENCOUNTER — Telehealth: Payer: Self-pay

## 2022-08-15 DIAGNOSIS — Z79899 Other long term (current) drug therapy: Secondary | ICD-10-CM

## 2022-08-15 NOTE — Telephone Encounter (Signed)
Kidney function stable. Let's double Torsemide to 40 mg BID x 5 days, then return to normal dosage. Would also recommend increasing Potassium to 40 mEq daily x 5 days, then return to 20 mEq daily. Let's repeat a BMET/Mag/proBNP in 1 week for medication management.    Thanks!   Best,  Sharlene Dory, NP

## 2022-08-15 NOTE — Telephone Encounter (Signed)
-----   Message from Sharlene Dory, NP sent at 08/14/2022  7:58 PM EDT ----- Kidney function stable. Let's double Torsemide to 40 mg BID x 5 days, then return to normal dosage. Would also recommend increasing Torsemide to 40 mEq daily x 5 days, then return to 20 mEq daily. Let's repeat a BMET/Mag/proBNP in 1 week for medication management.   Thanks!  Sharlene Dory, AGNP-C

## 2022-08-15 NOTE — Telephone Encounter (Signed)
Spoke to [place] at [facility] who verbalized understanding results. Christina asked that results be faxed to their facility.

## 2022-08-15 NOTE — Telephone Encounter (Signed)
[name] calling back stating she would like another order sent over to clarify the medication directions. Please advise.

## 2022-08-15 NOTE — Telephone Encounter (Signed)
Christina from [facility] called requesting to speak with RN Leonides Schanz. The line was disconnected before I could reach out to transfer the call.

## 2022-08-15 NOTE — Telephone Encounter (Signed)
Message sent to E. Philis Nettle, NP to addend note.

## 2022-08-15 NOTE — Telephone Encounter (Signed)
Clarified with Christina LPN at [facility] function stable. Let's double Torsemide to 40 mg BID x 5 days, then return to normal dosage. Would also recommend increasing potassium to 40 mEq daily x 5 days, then return to 20 mEq daily. Let's repeat a BMET/Mag/proBNP in 1 week for medication management.   Also faxed to [phone]

## 2022-08-27 ENCOUNTER — Encounter: Payer: Self-pay | Admitting: Pulmonary Disease

## 2022-08-27 ENCOUNTER — Encounter: Payer: Self-pay | Admitting: Internal Medicine

## 2022-08-27 ENCOUNTER — Ambulatory Visit (INDEPENDENT_AMBULATORY_CARE_PROVIDER_SITE_OTHER): Payer: Medicare Other | Admitting: Pulmonary Disease

## 2022-08-27 VITALS — BP 101/67 | HR 72 | Ht 65.0 in | Wt 204.6 lb

## 2022-08-27 DIAGNOSIS — I4819 Other persistent atrial fibrillation: Secondary | ICD-10-CM

## 2022-08-27 DIAGNOSIS — R0683 Snoring: Secondary | ICD-10-CM | POA: Diagnosis not present

## 2022-08-27 NOTE — Progress Notes (Signed)
Subjective:    Patient ID: [name], female    DOB: 1955-04-23, 67 y.o.   MRN: [id number]  HPI  Chief Complaint  Patient presents with   Consult    Pt sleep consult states that she does snore occasionally, denies and headaches or difficulty sleeping.    67 year old woman referred by cardiology for evaluation of OSA. She appears to have moderate dementia and is unable to provide much history, most of the history is obtained from chart review, she arrives from [facility] much medical records have been provided. I noted a hospitalization in 2021 where she was noted to have dementia and liver cirrhosis.  She denies a significant EtOH history.  I note cardiology evaluation and possibility of constrictive pericarditis is being considered. Mild Pulmonary HTN noted on Echo with evidence of decreased RV systolic function and volume overload.  Patient underwent CT chest without contrast in 05/2022 due to abnormal chest x-ray.  Which showed severe progressive pericardial thickening and calcification with imaging findings indicative of constrictive physiology as well as presence of calcifications in the left main and three-vessel coronary arteries.   Of note sleep study was negative in 2017.  She denies excessive daytime somnolence, is unable to fill out Epworth sleepiness score. Is after 11 PM, sleep latency is minimal, she sleeps on her back with 2 pillows, thinks and is out of bed sometimes as late as 10 AM.  No bed partner sleep history is available.  She denies choking or gasping episodes,  PMH -   liver cirrhosis,  hypertension, diabetes mellitus,  gout, depression, IBS, fibromyalgia,  HFpEF    Significant tests/ events reviewed  NPSG 09/2015 no significant OSA, low saturation 85%  Past Medical History:  Diagnosis Date   Abdominal hernia    Allergy    Anemia    Anginal pain (HCC)    CHF (congestive heart failure) (HCC)    Colonic polyp    DDD  (degenerative disc disease), cervical ddd   Depression    Diabetes mellitus without complication (HCC)    Fibromyalgia    Gout    Hyperlipidemia    Hypertension    IBS (irritable bowel syndrome)    Liver cirrhosis (HCC)    Polyneuropathy     Past Surgical History:  Procedure Laterality Date   CERVICAL DISC SURGERY     CESAREAN SECTION  [id number]   CHOLECYSTECTOMY  2015   COLONOSCOPY W/ BIOPSIES AND POLYPECTOMY  06/10/2013   ESOPHAGOGASTRODUODENOSCOPY     ESOPHAGOGASTRODUODENOSCOPY (EGD) WITH PROPOFOL N/A 06/27/2017   Procedure: ESOPHAGOGASTRODUODENOSCOPY (EGD) WITH PROPOFOL;  Surgeon: Christena Deem, MD;  Location: [facility] ENDOSCOPY;  Service: Endoscopy;  Laterality: N/A;   HERNIA REPAIR  2015   KNEE CARTILAGE SURGERY Right    partial amputation     toe surgery   SPINE SURGERY     TOE AMPUTATION Right 2015   partial amputation 3rd toe   TONSILLECTOMY  1987   UMBILICAL HERNIA REPAIR      Allergies  Allergen Reactions   Vicodin [Hydrocodone-Acetaminophen] Other (See Comments) and Rash    nightmares nightmares Per patient she crawls all over the bed    Social History   Socioeconomic History   Marital status: Single    Spouse name: Not on file   Number of children: Not on file   Years of education: Not on file   Highest education level: Not on file  Occupational History   Not on file  Tobacco Use   Smoking status: Never    Passive exposure: Never   Smokeless tobacco: Never  Vaping Use   Vaping Use: Never used  Substance and Sexual Activity   Alcohol use: No   Drug use: No   Sexual activity: Not on file  Other Topics Concern   Not on file  Social History Narrative   Patient lives alone and presents today with daughter   Social Determinants of Health   Financial Resource Strain: Not on file  Food Insecurity: Not on file  Transportation Needs: Not on file  Physical Activity: Not on file  Stress: Not on file  Social Connections: Not on file  Intimate  Partner Violence: Not on file    Family History  Problem Relation Age of Onset   Alcohol abuse Mother    Arthritis Mother    Depression Mother    Diabetes Mother    Hypertension Mother    Mental illness Mother    Alcohol abuse Father    Depression Father    Mental illness Father    Colon polyps Sister    Heart disease Maternal Aunt      Review of Systems Constitutional: negative for anorexia, fevers and sweats  Eyes: negative for irritation, redness and visual disturbance  Ears, nose, mouth, throat, and face: negative for earaches, epistaxis, nasal congestion and sore throat  Respiratory: negative for cough, dyspnea on exertion, sputum and wheezing  Cardiovascular: negative for chest pain, dyspnea, lower extremity edema, orthopnea, palpitations and syncope  Gastrointestinal: negative for abdominal pain, constipation, diarrhea, melena, nausea and vomiting  Genitourinary:negative for dysuria, frequency and hematuria  Hematologic/lymphatic: negative for bleeding, easy bruising and lymphadenopathy  Musculoskeletal:negative for arthralgias, muscle weakness and stiff joints  Neurological: negative for coordination problems, gait problems, headaches and weakness  Endocrine: negative for diabetic symptoms including polydipsia, polyuria and weight loss     Objective:   Physical Exam  Gen. Pleasant, obese, in no distress, normal affect ENT - no pallor,icterus, no post nasal drip, class 2 airway Neck: No JVD, no thyromegaly, no carotid bruits Lungs: no use of accessory muscles, no dullness to percussion, decreased without rales or rhonchi  Cardiovascular: Rhythm regular, heart sounds  normal, no murmurs or gallops, 2+ peripheral edema Abdomen: soft and non-tender, no hepatosplenomegaly, BS normal. Musculoskeletal: No deformities, no cyanosis or clubbing Neuro:  alert, non focal, no tremors        Assessment & Plan:   OSA -she has been referred by cardiology for screening sleep  test due to history of persistent atrial fibrillation.  Patient herself seems to have mild dementia, no family member or person from healthcare facility where she resides is present.  Patient herself states that she is not interested in sleep evaluation and even if she was found to have OSA would not be interested in CPAP therapy.  I tried to explain to her the close association between OSA and atrial fibrillation.  We will order home sleep test but I would not be surprised if she does not want to pursue this  The pathophysiology of obstructive sleep apnea , it's cardiovascular consequences & modes of treatment including CPAP were discused with the patient in detail & they evidenced understanding.

## 2022-08-27 NOTE — Patient Instructions (Signed)
X Home sleep test 

## 2022-09-10 ENCOUNTER — Ambulatory Visit: Payer: Medicare Other | Attending: Nurse Practitioner | Admitting: Nurse Practitioner

## 2022-09-10 ENCOUNTER — Encounter: Payer: Self-pay | Admitting: Nurse Practitioner

## 2022-09-10 VITALS — BP 102/80 | HR 61 | Ht 65.0 in | Wt 204.2 lb

## 2022-09-10 DIAGNOSIS — I4819 Other persistent atrial fibrillation: Secondary | ICD-10-CM

## 2022-09-10 DIAGNOSIS — E785 Hyperlipidemia, unspecified: Secondary | ICD-10-CM

## 2022-09-10 DIAGNOSIS — R600 Localized edema: Secondary | ICD-10-CM | POA: Diagnosis not present

## 2022-09-10 DIAGNOSIS — I319 Disease of pericardium, unspecified: Secondary | ICD-10-CM

## 2022-09-10 DIAGNOSIS — E669 Obesity, unspecified: Secondary | ICD-10-CM

## 2022-09-10 DIAGNOSIS — Z79899 Other long term (current) drug therapy: Secondary | ICD-10-CM | POA: Diagnosis not present

## 2022-09-10 DIAGNOSIS — I5032 Chronic diastolic (congestive) heart failure: Secondary | ICD-10-CM

## 2022-09-10 DIAGNOSIS — I272 Pulmonary hypertension, unspecified: Secondary | ICD-10-CM

## 2022-09-10 MED ORDER — DAPAGLIFLOZIN PROPANEDIOL 10 MG PO TABS: 10.0000 mg | ORAL_TABLET | Freq: Every day | ORAL | 6 refills | Status: DC

## 2022-09-10 MED ORDER — DAPAGLIFLOZIN PROPANEDIOL 10 MG PO TABS: 10.0000 mg | ORAL_TABLET | Freq: Every day | ORAL | 6 refills | Status: AC

## 2022-09-10 NOTE — Patient Instructions (Addendum)
Medication Instructions:   Begin Farxiga 10mg  daily  Continue all other medications.     Labwork:  CMET, FLP - orders given Please do in 1 week  Reminder:  Nothing to eat or drink after 12 midnight prior to labs. Office will contact with results via phone, letter or mychart.     Testing/Procedures:  none  Follow-Up:  6 - 8 weeks    Any Other Special Instructions Will Be Listed Below (If Applicable).   If you need a refill on your cardiac medications before your next appointment, please call your pharmacy.

## 2022-09-10 NOTE — Progress Notes (Addendum)
Office Visit    Patient Name: [name] Date of Encounter: 09/10/2022  PCP:  Charlynne Pander, MD    [facility]  Cardiologist:  Marjo Bicker, MD  Advanced Practice Provider:  No care team member to display Electrophysiologist:  None   Chief Complaint    [name] is a 67 y.o. female with a hx of HFpEF, persistent A-fib, hyperlipidemia, and type 2 diabetes, who presents today for follow-up based on recent echocardiogram result.  Past Medical History    Past Medical History:  Diagnosis Date   Abdominal hernia    Allergy    Anemia    Anginal pain (HCC)    CHF (congestive heart failure) (HCC)    Colonic polyp    DDD (degenerative disc disease), cervical ddd   Depression    Diabetes mellitus without complication (HCC)    Fibromyalgia    Gout    Hyperlipidemia    Hypertension    IBS (irritable bowel syndrome)    Liver cirrhosis (HCC)    Polyneuropathy    Past Surgical History:  Procedure Laterality Date   CERVICAL DISC SURGERY     CESAREAN SECTION  [id number]   CHOLECYSTECTOMY  2015   COLONOSCOPY W/ BIOPSIES AND POLYPECTOMY  06/10/2013   ESOPHAGOGASTRODUODENOSCOPY     ESOPHAGOGASTRODUODENOSCOPY (EGD) WITH PROPOFOL N/A 06/27/2017   Procedure: ESOPHAGOGASTRODUODENOSCOPY (EGD) WITH PROPOFOL;  Surgeon: Christena Deem, MD;  Location: [facility] ENDOSCOPY;  Service: Endoscopy;  Laterality: N/A;   HERNIA REPAIR  2015   KNEE CARTILAGE SURGERY Right    partial amputation     toe surgery   SPINE SURGERY     TOE AMPUTATION Right 2015   partial amputation 3rd toe   TONSILLECTOMY  1987   UMBILICAL HERNIA REPAIR      Allergies  Allergies  Allergen Reactions   Vicodin [Hydrocodone-Acetaminophen] Other (See Comments) and Rash    nightmares nightmares Per patient she crawls all over the bed    History of Present Illness    [name] is a 67 y.o. female with a PMH as mentioned above.  History of atrial fibrillation since  2021.  Last seen by Dr. Jenene Slicker on June 28, 2022.  Was referred for evaluation of coronary calcifications of pericardial thickening noted on CT scan.  CT showed severe progressive pericardial thickening, indicative of constrictive physiology.  Coronary artery calcium calcification severity was not documented. Overall was doing well from a cardiac perspective. Patient was unsure why she is in a wheelchair.  Never tested for OSA or on anticoagulation or rate controlling agents for A-fib. Was started on metoprolol to tartrate 25 mg twice daily, Eliquis 5 mg twice daily.  Was referred to pulmonology for OSA evaluation.  Echocardiogram was obtained and revealed results noted below.  Today she presents for follow-up and is residing at [facility].  She is doing well today and denies any acute cardiac complaints or issues.  She has lost 12 pounds since I last saw her.  Denies any chest pain, shortness of breath, palpitations, syncope, presyncope, dizziness, orthopnea, PND, acute bleeding, or claudication.  Still has leg edema, currently wrapped with Ace bandages, stable per her report.   EKGs/Labs/Other Studies Reviewed:   The following studies were reviewed today:   EKG:  EKG is not ordered today.    Echo 07/25/2022:  1. Left ventricular ejection fraction, by estimation, is 60 to 65%. The  left ventricle has normal function. The left ventricle has no  regional  wall motion abnormalities. Left ventricular diastolic parameters are  indeterminate.   2. Limited visualization however ventricular septum appears to be  flattened in systole and diastole suggesting RV pressure and volume  overload. RV not well visualized, grossly appears enlarged with decreased  systolic function. . Right ventricular  systolic function was not well visualized. The right ventricular size is  not well visualized. There is mildly elevated pulmonary artery systolic  pressure.   3. Left atrial size was moderately dilated.   4.  Right atrial size was moderately dilated.   5. The mitral valve was not well visualized. No evidence of mitral valve  regurgitation. No evidence of mitral stenosis.   6. Tricuspid valve regurgitation is moderate.   7. The aortic valve has an indeterminant number of cusps. Aortic valve  regurgitation is not visualized. No aortic stenosis is present.   8. The inferior vena cava is dilated in size with <50% respiratory  variability, suggesting right atrial pressure of 15 mmHg.   Comparison(s): Echocardiogram done 08/11/15 showed an EF of >55%.  Recent Labs: No results found for requested labs within last 365 days.  Recent Lipid Panel No results found for: "CHOL", "TRIG", "HDL", "CHOLHDL", "VLDL", "LDLCALC", "LDLDIRECT"  Risk Assessment/Calculations:   CHA2DS2-VASc Score = 5  This indicates a 7.2% annual risk of stroke. The patient's score is based upon: CHF History: 1 HTN History: 1 Diabetes History: 1 Stroke History: 0 Vascular Disease History: 0 Age Score: 1 Gender Score: 1  Home Medications   Current Meds  Medication Sig   acetaminophen (TYLENOL) 500 MG tablet Take 1,000 mg by mouth every 8 (eight) hours as needed.   allopurinol (ZYLOPRIM) 100 MG tablet Take 100 mg by mouth 2 (two) times daily.   apixaban (ELIQUIS) 5 MG TABS tablet Take 1 tablet (5 mg total) by mouth 2 (two) times daily.   ascorbic acid (VITAMIN C) 500 MG tablet Take 500 mg by mouth 2 (two) times daily.   busPIRone (BUSPAR) 5 MG tablet Take 5 mg by mouth 2 (two) times daily.   Cholecalciferol 25 MCG (1000 UT) tablet Take 2,000 Units by mouth daily.   cyanocobalamin (VITAMIN B12) 500 MCG tablet Take 500 mcg by mouth daily.   cyclobenzaprine (FLEXERIL) 5 MG tablet Take 5 mg by mouth every 8 (eight) hours as needed for muscle spasms.   ferrous sulfate 324 MG TBEC Take 324 mg by mouth in the morning and at bedtime.   folic acid (FOLATE) 400 MCG tablet Take 400 mcg by mouth daily.   gabapentin (NEURONTIN) 400 MG  capsule Take 400 mg by mouth 3 (three) times daily.   lactulose (CHRONULAC) 10 GM/15ML solution Take 10 mLs by mouth daily.   melatonin 3 MG TABS tablet Take 6 mg by mouth at bedtime.   metoprolol tartrate (LOPRESSOR) 25 MG tablet Take 1 tablet (25 mg total) by mouth 2 (two) times daily.   mirabegron ER (MYRBETRIQ) 25 MG TB24 tablet Take 25 mg by mouth daily.   polyethylene glycol (MIRALAX / GLYCOLAX) 17 g packet Take 17 g by mouth daily.   potassium chloride SA (KLOR-CON) 20 MEQ tablet Take 1 tablet (20 mEq total) by mouth daily.   torsemide (DEMADEX) 20 MG tablet Take 1 tablet (20 mg total) by mouth 2 (two) times daily. May take an additional 20 mg daily as needed for SOB     Review of Systems    All other systems reviewed and are otherwise negative except as noted  above.  Physical Exam    VS:  BP 102/80   Pulse 61   Ht 5\' 5"  (1.651 m)   Wt 204 lb 3.2 oz (92.6 kg)   SpO2 98%   BMI 33.98 kg/m  , BMI Body mass index is 33.98 kg/m.  Wt Readings from Last 3 Encounters:  09/10/22 204 lb 3.2 oz (92.6 kg)  08/27/22 204 lb 9.6 oz (92.8 kg)  08/12/22 216 lb (98 kg)    GEN: Obese, 67 y.o. female  in no acute distress. HEENT: normal. Neck: Supple, no JVD, carotid bruits, or masses. Cardiac: S1/S2, irregular rhythm and regular rate, no murmurs, rubs, or gallops. No clubbing, cyanosis. Generalized, nonpitting edema. Edema noted to BLE, legs wrapped, RLE > LLE. Radials 2+ equal bilaterally/PT UTA.  Respiratory:  Respirations regular and unlabored, clear to auscultation bilaterally. MS: No deformity or atrophy. Skin: Warm and dry, no rash. Neuro:  Strength and sensation are intact. Psych: Normal affect.  Assessment & Plan    Chronic HFpEF, leg edema, medication management, disorder of pericardium Stage C, NYHA class II-III symptoms. TTE 07/2022 revealed normal EF, indeterminate diastolic parameters. RV showed decreased systolic function, also showed RV volume overload, with mild  pulmonary HTN. Euvolemic and well compensated on exam, leg edema stable.  Will begin SGLT2 inhibitor and obtain CMET in 1 week.  At next check, if kidney function permits, plan to uptitrate torsemide for few days, then return to normal dosing. Continue Torsemide, Lopressor, and potassium supplementation. Unable to further titrate GDMT as this time d/t current blood pressure. Low sodium diet, fluid restriction <2L, and daily weights encouraged. Educated to contact our office for weight gain of 2 lbs overnight or 5 lbs in one week.  I discussed cardiac MRI with her based on Dr. Antoine Poche previous recommendations, she declines at this time if it is not needed, will consult Dr. Jenene Slicker to get final recommendations.  ED precautions discussed.   Addendum: Dr. Jenene Slicker stated okay to wait on cMRI as she has no symptoms.   Persistent A-fib Denies any tachycardia or palpitations. HR well controlled. Continue Lopressor. Continue Eliquis. On appropriate dosage, denies any bleeding issues. Will obtain CMET in 1 week as mentioned above. Heart healthy diet and regular cardiovascular exercise encouraged.   HLD No recent labs on file. Being managed by PCP. Currently not on any lipid lowering agents. Will obtain CMET and FLP in 1 week. Heart healthy diet and regular cardiovascular exercise encouraged.   4. Obesity BMI 33.98. Weight loss via diet and exercise encouraged. Discussed the impact being overweight would have on cardiovascular risk.  5. Pulmonary HTN, OSA? Mild Pulmonary HTN noted on Echo with evidence of decreased RV systolic function and volume overload. Etiology unclear. Continue Torsemide and potassium supplementation. Currently seeing Dr. Vassie Loll who has ordered a home sleep study.   Disposition: Follow up in 6-8 week(s) with Vishnu P Mallipeddi, MD or APP.  Signed, Sharlene Dory, NP 09/10/2022, 9:17 AM [facility]

## 2022-10-29 ENCOUNTER — Encounter: Payer: Self-pay | Admitting: Nurse Practitioner

## 2022-10-29 ENCOUNTER — Ambulatory Visit: Payer: Medicare Other | Attending: Nurse Practitioner | Admitting: Nurse Practitioner

## 2022-10-29 VITALS — BP 114/68 | HR 87 | Ht 65.0 in | Wt 204.4 lb

## 2022-10-29 DIAGNOSIS — I5032 Chronic diastolic (congestive) heart failure: Secondary | ICD-10-CM

## 2022-10-29 DIAGNOSIS — R600 Localized edema: Secondary | ICD-10-CM

## 2022-10-29 DIAGNOSIS — I319 Disease of pericardium, unspecified: Secondary | ICD-10-CM | POA: Diagnosis not present

## 2022-10-29 DIAGNOSIS — E669 Obesity, unspecified: Secondary | ICD-10-CM

## 2022-10-29 DIAGNOSIS — I4891 Unspecified atrial fibrillation: Secondary | ICD-10-CM

## 2022-10-29 DIAGNOSIS — Z79899 Other long term (current) drug therapy: Secondary | ICD-10-CM

## 2022-10-29 DIAGNOSIS — I272 Pulmonary hypertension, unspecified: Secondary | ICD-10-CM

## 2022-10-29 DIAGNOSIS — E785 Hyperlipidemia, unspecified: Secondary | ICD-10-CM

## 2022-10-29 NOTE — Patient Instructions (Addendum)
Medication Instructions:  Your physician has recommended you make the following change in your medication:  For 3 days increase Torsemide to 40 Mg in the Morning and 20 Mg in the evening  After 3 days reduce back to 20 Mg twice daily  Continue all other medications as prescribed   Labwork: In 1 week CMET, MAG, BNP, Lipid  Testing/Procedures: none  Follow-Up: Your physician recommends that you schedule a follow-up appointment in: 3-4 Months with Philis Nettle   Any Other Special Instructions Will Be Listed Below (If Applicable).  If you need a refill on your cardiac medications before your next appointment, please call your pharmacy.

## 2022-10-29 NOTE — Progress Notes (Addendum)
Office Visit    Patient Name: [name] Date of Encounter: 10/29/2022 PCP:  Charlynne Pander, MD [facility]  Cardiologist:  Marjo Bicker, MD  Advanced Practice Provider:  No care team member to display Electrophysiologist:  None   Chief Complaint and HPI    [name] is a 67 y.o. female with a hx of chronic HFpEF, persistent A-fib (since 2021), disorder of pericardium, hyperlipidemia, and type 2 diabetes, pulmonary HTN/OSA?, who presents today for scheduled follow-up.  Last seen by Dr. Jenene Slicker on June 28, 2022.  Was referred for evaluation of coronary calcifications of pericardial thickening noted on CT scan.  CT showed severe progressive pericardial thickening, indicative of constrictive physiology.  Coronary artery calcium calcification severity was not documented. Overall was doing well from a cardiac perspective. Patient was unsure why she is in a wheelchair.  Never tested for OSA or on anticoagulation or rate controlling agents for A-fib. Was started on metoprolol to tartrate 25 mg twice daily, Eliquis 5 mg twice daily.  Was referred to pulmonology for OSA evaluation.  TTE was obtained and revealed results noted below.  I last saw patient for follow-up on Aug 12, 2022 from [facility].  Was doing well. Was started on Farxiga.   Today she presents for follow-up. Doing very well. Denies any chest pain, shortness of breath, palpitations, syncope, presyncope, dizziness, orthopnea, PND, significant weight changes, acute bleeding, or claudication. Leg swelling is stable, she states she wraps her own legs. Compliant with her medications.   EKGs/Labs/Other Studies Reviewed:   The following studies were reviewed today:   EKG:  EKG is not ordered today.    Echo 07/25/2022:  1. Left ventricular ejection fraction, by estimation, is 60 to 65%. The  left ventricle has normal function. The left ventricle has no regional  wall motion abnormalities.  Left ventricular diastolic parameters are  indeterminate.   2. Limited visualization however ventricular septum appears to be  flattened in systole and diastole suggesting RV pressure and volume  overload. RV not well visualized, grossly appears enlarged with decreased  systolic function. . Right ventricular  systolic function was not well visualized. The right ventricular size is  not well visualized. There is mildly elevated pulmonary artery systolic  pressure.   3. Left atrial size was moderately dilated.   4. Right atrial size was moderately dilated.   5. The mitral valve was not well visualized. No evidence of mitral valve  regurgitation. No evidence of mitral stenosis.   6. Tricuspid valve regurgitation is moderate.   7. The aortic valve has an indeterminant number of cusps. Aortic valve  regurgitation is not visualized. No aortic stenosis is present.   8. The inferior vena cava is dilated in size with <50% respiratory  variability, suggesting right atrial pressure of 15 mmHg.   Comparison(s): Echocardiogram done 08/11/15 showed an EF of >55%.  Risk Assessment/Calculations:   CHA2DS2-VASc Score = 5  This indicates a 7.2% annual risk of stroke. The patient's score is based upon: CHF History: 1 HTN History: 1 Diabetes History: 1 Stroke History: 0 Vascular Disease History: 0 Age Score: 1 Gender Score: 1  Review of Systems    All other systems reviewed and are otherwise negative except as noted above.  Physical Exam    VS:  BP 114/68 (BP Location: Right Arm, Patient Position: Sitting, Cuff Size: Normal)   Pulse 87   Ht 5\' 5"  (1.651 m)   Wt 204 lb 6.4  oz (92.7 kg)   SpO2 95%   BMI 34.01 kg/m  , BMI Body mass index is 34.01 kg/m.  Wt Readings from Last 3 Encounters:  10/29/22 204 lb 6.4 oz (92.7 kg)  09/10/22 204 lb 3.2 oz (92.6 kg)  08/27/22 204 lb 9.6 oz (92.8 kg)    GEN: Obese, 67 y.o. female  in no acute distress, sitting in wheelchair. HEENT: normal. Neck:  Supple, no JVD, carotid bruits, or masses. Cardiac: S1/S2, RRR, no murmurs, rubs, or gallops. No clubbing, cyanosis. Generalized, nonpitting edema. Edema noted to BLE, legs wrapped, RLE > LLE. Radials 2+ equal bilaterally/PT UTA.  Respiratory:  Respirations regular and unlabored, clear to auscultation bilaterally. MS: No deformity or atrophy. Skin: Warm and dry, no rash. Neuro:  Strength and sensation are intact. Psych: Normal affect.  Assessment & Plan    Chronic HFpEF, leg edema, medication management, disorder of pericardium Stage C, NYHA class II symptoms. TTE 07/2022 revealed normal EF, indeterminate diastolic parameters. RV showed decreased systolic function, also showed RV volume overload, with mild pulmonary HTN. Euvolemic and well compensated on exam, leg edema stable, continue with leg wraps and leg elevation.  Continue Farxiga, Lopressor, and potassium supplementation. Will increase Torsemide to 40 mg in AM x 3 days, continue 20 mg in PM. Unable to further titrate GDMT as this time d/t current blood pressure. Low sodium diet, fluid restriction <2L, and daily weights encouraged. Educated to contact our office for weight gain of 2 lbs overnight or 5 lbs in one week. Pt previously declined cMRI.  ED precautions discussed. Will obtain the following labs in 1 week: CMET, Mag, and proBNP.   A-fib Denies any tachycardia or palpitations. HR well controlled. Continue Lopressor. Continue Eliquis. On appropriate dosage, denies any bleeding issues. Will obtain CMET in 1 week as mentioned above. Heart healthy diet and regular cardiovascular exercise encouraged.   HLD No recent labs on file. Being managed by PCP. Currently not on any lipid lowering agents. Will obtain CMET and FLP in 1 week as mentioned above. Heart healthy diet and regular cardiovascular exercise encouraged.   4. Obesity Weight loss via diet and exercise encouraged. Discussed the impact being overweight would have on cardiovascular  risk.  5. Pulmonary HTN, OSA? Mild Pulmonary HTN noted on Echo with evidence of decreased RV systolic function and volume overload. Etiology unclear. Increasing AM Torsemide x 3 days as mentioned above, then return to normal dosing. Continue PM Torsemide and potassium supplementation. Continue to follow-up with Pulmonology, pt refused sleep study.   Addendum 11/12/2022: Received fax from Levester Fresh (Nurse Unit Manager at [facility]) on November 12, 2022 with patient's most recent lab results. Recent labs reveal elevated BNP at 2,259, overall stable CMET, and stable Mag and LDL at 92, FLP overall stable. Recommend permanently increasing Torsemide to 40 mg BID. We will therefore increase Potassium Supplement to 40 mEq daily.   Disposition: Follow up in 3-4 months  with Vishnu P Mallipeddi, MD or APP.  Signed, Sharlene Dory, NP 10/29/2022, 11:07 AM [facility]

## 2022-11-12 ENCOUNTER — Telehealth: Payer: Self-pay | Admitting: *Deleted

## 2022-11-12 DIAGNOSIS — Z79899 Other long term (current) drug therapy: Secondary | ICD-10-CM

## 2022-11-12 DIAGNOSIS — I5032 Chronic diastolic (congestive) heart failure: Secondary | ICD-10-CM

## 2022-11-12 MED ORDER — TORSEMIDE 20 MG PO TABS: 40.0000 mg | ORAL_TABLET | Freq: Two times a day (BID) | ORAL | 6 refills | Status: DC

## 2022-11-12 MED ORDER — POTASSIUM CHLORIDE CRYS ER 20 MEQ PO TBCR: 40.0000 meq | EXTENDED_RELEASE_TABLET | Freq: Every day | ORAL | 6 refills | Status: AC

## 2022-11-12 NOTE — Addendum Note (Signed)
Addended by: Eustace Moore on: 11/12/2022 04:24 PM   Modules accepted: Orders

## 2022-11-12 NOTE — Telephone Encounter (Signed)
-----   Message from Sharlene Dory sent at 11/12/2022  4:05 PM EDT ----- I did addendum my note based on her recent labs faxed from [facility]. Let's increase Torsemide to 40 mg BID and potassium to 40 mEq daily permanently based on recent labs. Recommend repeating BMET, proBNP, and Mag in 2 weeks for monitoring. Follow-up with me as scheduled.  Thanks!   Best,  Sharlene Dory, NP

## 2022-11-12 NOTE — Telephone Encounter (Signed)
Johnnye Lana LPN, nurse for patient at SNF informed and verbalized understanding. Faxed to [phone]

## 2022-11-22 ENCOUNTER — Encounter: Payer: Self-pay | Admitting: Nurse Practitioner

## 2022-11-25 ENCOUNTER — Telehealth: Payer: Self-pay

## 2022-11-25 NOTE — Telephone Encounter (Signed)
-----   Message from Vishnu P Mallipeddi sent at 11/25/2022  9:58 AM EDT ----- BNP mildly elevated, 164 (stable in the last 1 year).  Continue current medications.  Has appointment with Sharlene Dory on 01/30/2023.

## 2022-11-25 NOTE — Telephone Encounter (Signed)
Patient is at [facility]. Spoke with Onji (Nurse) verbalized understanding

## 2022-12-04 ENCOUNTER — Encounter: Payer: Self-pay | Admitting: Adult Health Nurse Practitioner

## 2022-12-24 ENCOUNTER — Encounter: Payer: Self-pay | Admitting: Nurse Practitioner

## 2023-01-30 ENCOUNTER — Encounter: Payer: Self-pay | Admitting: Nurse Practitioner

## 2023-01-30 ENCOUNTER — Ambulatory Visit: Payer: Medicare Other | Admitting: Nurse Practitioner

## 2023-01-30 NOTE — Progress Notes (Deleted)
Office Visit    Patient Name: [name] Date of Encounter: 10/29/2022 PCP:  Charlynne Pander, MD [facility]  Cardiologist:  Marjo Bicker, MD  Advanced Practice Provider:  No care team member to display Electrophysiologist:  None   Chief Complaint and HPI    [name] is a 67 y.o. female with a hx of chronic HFpEF, persistent A-fib (since 2021), disorder of pericardium, hyperlipidemia, and type 2 diabetes, pulmonary HTN/OSA?, who presents today for scheduled follow-up.  Last seen by Dr. Jenene Slicker on June 28, 2022.  Was referred for evaluation of coronary calcifications of pericardial thickening noted on CT scan.  CT showed severe progressive pericardial thickening, indicative of constrictive physiology.  Coronary artery calcium calcification severity was not documented. Overall was doing well from a cardiac perspective. Patient was unsure why she is in a wheelchair.  Never tested for OSA or on anticoagulation or rate controlling agents for A-fib. Was started on metoprolol to tartrate 25 mg twice daily, Eliquis 5 mg twice daily.  Was referred to pulmonology for OSA evaluation.  TTE was obtained and revealed results noted below.  I last saw patient for follow-up on Aug 12, 2022 from [facility].  Was doing well. Was started on Farxiga.   Today she presents for follow-up. Doing very well. Denies any chest pain, shortness of breath, palpitations, syncope, presyncope, dizziness, orthopnea, PND, significant weight changes, acute bleeding, or claudication. Leg swelling is stable, she states she wraps her own legs. Compliant with her medications.   EKGs/Labs/Other Studies Reviewed:   The following studies were reviewed today:   EKG:  EKG is not ordered today.    Echo 07/25/2022:  1. Left ventricular ejection fraction, by estimation, is 60 to 65%. The  left ventricle has normal function. The left ventricle has no regional  wall motion abnormalities.  Left ventricular diastolic parameters are  indeterminate.   2. Limited visualization however ventricular septum appears to be  flattened in systole and diastole suggesting RV pressure and volume  overload. RV not well visualized, grossly appears enlarged with decreased  systolic function. . Right ventricular  systolic function was not well visualized. The right ventricular size is  not well visualized. There is mildly elevated pulmonary artery systolic  pressure.   3. Left atrial size was moderately dilated.   4. Right atrial size was moderately dilated.   5. The mitral valve was not well visualized. No evidence of mitral valve  regurgitation. No evidence of mitral stenosis.   6. Tricuspid valve regurgitation is moderate.   7. The aortic valve has an indeterminant number of cusps. Aortic valve  regurgitation is not visualized. No aortic stenosis is present.   8. The inferior vena cava is dilated in size with <50% respiratory  variability, suggesting right atrial pressure of 15 mmHg.   Comparison(s): Echocardiogram done 08/11/15 showed an EF of >55%.  Risk Assessment/Calculations:   CHA2DS2-VASc Score = 5  This indicates a 7.2% annual risk of stroke. The patient's score is based upon: CHF History: 1 HTN History: 1 Diabetes History: 1 Stroke History: 0 Vascular Disease History: 0 Age Score: 1 Gender Score: 1  Review of Systems    All other systems reviewed and are otherwise negative except as noted above.  Physical Exam    VS:  There were no vitals taken for this visit. , BMI There is no height or weight on file to calculate BMI.  Wt Readings from Last 3 Encounters:  10/29/22 204 lb 6.4 oz (92.7 kg)  09/10/22 204 lb 3.2 oz (92.6 kg)  08/27/22 204 lb 9.6 oz (92.8 kg)    GEN: Obese, 67 y.o. female  in no acute distress, sitting in wheelchair. HEENT: normal. Neck: Supple, no JVD, carotid bruits, or masses. Cardiac: S1/S2, RRR, no murmurs, rubs, or gallops. No clubbing,  cyanosis. Generalized, nonpitting edema. Edema noted to BLE, legs wrapped, RLE > LLE. Radials 2+ equal bilaterally/PT UTA.  Respiratory:  Respirations regular and unlabored, clear to auscultation bilaterally. MS: No deformity or atrophy. Skin: Warm and dry, no rash. Neuro:  Strength and sensation are intact. Psych: Normal affect.  Assessment & Plan    Chronic HFpEF, leg edema, medication management, disorder of pericardium Stage C, NYHA class II symptoms. TTE 07/2022 revealed normal EF, indeterminate diastolic parameters. RV showed decreased systolic function, also showed RV volume overload, with mild pulmonary HTN. Euvolemic and well compensated on exam, leg edema stable, continue with leg wraps and leg elevation.  Continue Farxiga, Lopressor, and potassium supplementation. Will increase Torsemide to 40 mg in AM x 3 days, continue 20 mg in PM. Unable to further titrate GDMT as this time d/t current blood pressure. Low sodium diet, fluid restriction <2L, and daily weights encouraged. Educated to contact our office for weight gain of 2 lbs overnight or 5 lbs in one week. Pt previously declined cMRI.  ED precautions discussed. Will obtain the following labs in 1 week: CMET, Mag, and proBNP.   A-fib Denies any tachycardia or palpitations. HR well controlled. Continue Lopressor. Continue Eliquis. On appropriate dosage, denies any bleeding issues. Will obtain CMET in 1 week as mentioned above. Heart healthy diet and regular cardiovascular exercise encouraged.   HLD No recent labs on file. Being managed by PCP. Currently not on any lipid lowering agents. Will obtain CMET and FLP in 1 week as mentioned above. Heart healthy diet and regular cardiovascular exercise encouraged.   4. Obesity Weight loss via diet and exercise encouraged. Discussed the impact being overweight would have on cardiovascular risk.  5. Pulmonary HTN, OSA? Mild Pulmonary HTN noted on Echo with evidence of decreased RV systolic  function and volume overload. Etiology unclear. Increasing AM Torsemide x 3 days as mentioned above, then return to normal dosing. Continue PM Torsemide and potassium supplementation. Continue to follow-up with Pulmonology, pt refused sleep study.   Addendum 11/12/2022: Received fax from Levester Fresh (Nurse Unit Manager at [facility]) on November 12, 2022 with patient's most recent lab results. Recent labs reveal elevated BNP at 2,259, overall stable CMET, and stable Mag and LDL at 92, FLP overall stable. Recommend permanently increasing Torsemide to 40 mg BID. We will therefore increase Potassium Supplement to 40 mEq daily.   Disposition: Follow up in 3-4 months  with Vishnu P Mallipeddi, MD or APP.  Signed, Sharlene Dory, NP 01/30/2023, 8:44 AM  [facility]

## 2023-02-17 ENCOUNTER — Ambulatory Visit: Payer: Medicare Other | Attending: Nurse Practitioner | Admitting: Nurse Practitioner

## 2023-02-17 ENCOUNTER — Encounter: Payer: Self-pay | Admitting: Nurse Practitioner

## 2023-02-17 VITALS — BP 110/68 | HR 63 | Ht 65.0 in | Wt 200.0 lb

## 2023-02-17 DIAGNOSIS — I272 Pulmonary hypertension, unspecified: Secondary | ICD-10-CM

## 2023-02-17 DIAGNOSIS — I5032 Chronic diastolic (congestive) heart failure: Secondary | ICD-10-CM

## 2023-02-17 DIAGNOSIS — I4891 Unspecified atrial fibrillation: Secondary | ICD-10-CM

## 2023-02-17 DIAGNOSIS — E785 Hyperlipidemia, unspecified: Secondary | ICD-10-CM

## 2023-02-17 DIAGNOSIS — R600 Localized edema: Secondary | ICD-10-CM

## 2023-02-17 DIAGNOSIS — I319 Disease of pericardium, unspecified: Secondary | ICD-10-CM

## 2023-02-17 DIAGNOSIS — Z79899 Other long term (current) drug therapy: Secondary | ICD-10-CM | POA: Diagnosis not present

## 2023-02-17 DIAGNOSIS — E669 Obesity, unspecified: Secondary | ICD-10-CM

## 2023-02-17 NOTE — Patient Instructions (Addendum)
Medication Instructions:  Your physician recommends that you continue on your current medications as directed. Please refer to the Current Medication list given to you today.  Labwork: In 2-3 weeks   Testing/Procedures: None   Follow-Up: Your physician recommends that you schedule a follow-up appointment in: 3-4 Months   Any Other Special Instructions Will Be Listed Below (If Applicable).  If you need a refill on your cardiac medications before your next appointment, please call your pharmacy.

## 2023-02-17 NOTE — Progress Notes (Unsigned)
Office Visit    Patient Name: [name] Date of Encounter: 10/29/2022 PCP:  Charlynne Pander, MD [facility]  Cardiologist:  Marjo Bicker, MD  Advanced Practice Provider:  No care team member to display Electrophysiologist:  None   Chief Complaint and HPI    [name] is a 67 y.o. female with a hx of chronic HFpEF, persistent A-fib (since 2021), disorder of pericardium, hyperlipidemia, and type 2 diabetes, pulmonary HTN/OSA?, who presents today for scheduled follow-up.  Last seen by Dr. Jenene Slicker on June 28, 2022.  Was referred for evaluation of coronary calcifications of pericardial thickening noted on CT scan.  CT showed severe progressive pericardial thickening, indicative of constrictive physiology.  Coronary artery calcium calcification severity was not documented. Overall was doing well from a cardiac perspective. Patient was unsure why she is in a wheelchair.  Never tested for OSA or on anticoagulation or rate controlling agents for A-fib. Was started on metoprolol to tartrate 25 mg twice daily, Eliquis 5 mg twice daily.  Was referred to pulmonology for OSA evaluation.  TTE was obtained and revealed results noted below.  I last saw patient for follow-up on Aug 12, 2022 from [facility].  Was doing well. Was started on Farxiga.   Today she presents for follow-up. Doing very well. Denies any chest pain, shortness of breath, palpitations, syncope, presyncope, dizziness, orthopnea, PND, significant weight changes, acute bleeding, or claudication. Leg swelling is stable, she states she wraps her own legs. Compliant with her medications.   EKGs/Labs/Other Studies Reviewed:   The following studies were reviewed today:   EKG:  EKG is not ordered today.    Echo 07/25/2022:  1. Left ventricular ejection fraction, by estimation, is 60 to 65%. The  left ventricle has normal function. The left ventricle has no regional  wall motion abnormalities.  Left ventricular diastolic parameters are  indeterminate.   2. Limited visualization however ventricular septum appears to be  flattened in systole and diastole suggesting RV pressure and volume  overload. RV not well visualized, grossly appears enlarged with decreased  systolic function. . Right ventricular  systolic function was not well visualized. The right ventricular size is  not well visualized. There is mildly elevated pulmonary artery systolic  pressure.   3. Left atrial size was moderately dilated.   4. Right atrial size was moderately dilated.   5. The mitral valve was not well visualized. No evidence of mitral valve  regurgitation. No evidence of mitral stenosis.   6. Tricuspid valve regurgitation is moderate.   7. The aortic valve has an indeterminant number of cusps. Aortic valve  regurgitation is not visualized. No aortic stenosis is present.   8. The inferior vena cava is dilated in size with <50% respiratory  variability, suggesting right atrial pressure of 15 mmHg.   Comparison(s): Echocardiogram done 08/11/15 showed an EF of >55%.  Risk Assessment/Calculations:   CHA2DS2-VASc Score = 5  This indicates a 7.2% annual risk of stroke. The patient's score is based upon: CHF History: 1 HTN History: 1 Diabetes History: 1 Stroke History: 0 Vascular Disease History: 0 Age Score: 1 Gender Score: 1  Review of Systems    All other systems reviewed and are otherwise negative except as noted above.  Physical Exam    VS:  There were no vitals taken for this visit. , BMI There is no height or weight on file to calculate BMI.  Wt Readings from Last 3 Encounters:  10/29/22 204 lb 6.4 oz (92.7 kg)  09/10/22 204 lb 3.2 oz (92.6 kg)  08/27/22 204 lb 9.6 oz (92.8 kg)    GEN: Obese, 67 y.o. female  in no acute distress, sitting in wheelchair. HEENT: normal. Neck: Supple, no JVD, carotid bruits, or masses. Cardiac: S1/S2, RRR, no murmurs, rubs, or gallops. No clubbing,  cyanosis. Generalized, nonpitting edema. Edema noted to BLE, legs wrapped, RLE > LLE. Radials 2+ equal bilaterally/PT UTA.  Respiratory:  Respirations regular and unlabored, clear to auscultation bilaterally. MS: No deformity or atrophy. Skin: Warm and dry, no rash. Neuro:  Strength and sensation are intact. Psych: Normal affect.  Assessment & Plan    Chronic HFpEF, leg edema, medication management, disorder of pericardium Stage C, NYHA class II symptoms. TTE 07/2022 revealed normal EF, indeterminate diastolic parameters. RV showed decreased systolic function, also showed RV volume overload, with mild pulmonary HTN. Euvolemic and well compensated on exam, leg edema stable, continue with leg wraps and leg elevation.  Continue Farxiga, Lopressor, and potassium supplementation. Will increase Torsemide to 40 mg in AM x 3 days, continue 20 mg in PM. Unable to further titrate GDMT as this time d/t current blood pressure. Low sodium diet, fluid restriction <2L, and daily weights encouraged. Educated to contact our office for weight gain of 2 lbs overnight or 5 lbs in one week. Pt previously declined cMRI.  ED precautions discussed. Will obtain the following labs in 1 week: CMET, Mag, and proBNP.   A-fib Denies any tachycardia or palpitations. HR well controlled. Continue Lopressor. Continue Eliquis. On appropriate dosage, denies any bleeding issues. Will obtain CMET in 1 week as mentioned above. Heart healthy diet and regular cardiovascular exercise encouraged.   HLD No recent labs on file. Being managed by PCP. Currently not on any lipid lowering agents. Will obtain CMET and FLP in 1 week as mentioned above. Heart healthy diet and regular cardiovascular exercise encouraged.   4. Obesity Weight loss via diet and exercise encouraged. Discussed the impact being overweight would have on cardiovascular risk.  5. Pulmonary HTN, OSA? Mild Pulmonary HTN noted on Echo with evidence of decreased RV systolic  function and volume overload. Etiology unclear. Increasing AM Torsemide x 3 days as mentioned above, then return to normal dosing. Continue PM Torsemide and potassium supplementation. Continue to follow-up with Pulmonology, pt refused sleep study.   Addendum 11/12/2022: Received fax from Levester Fresh (Nurse Unit Manager at [facility]) on November 12, 2022 with patient's most recent lab results. Recent labs reveal elevated BNP at 2,259, overall stable CMET, and stable Mag and LDL at 92, FLP overall stable. Recommend permanently increasing Torsemide to 40 mg BID. We will therefore increase Potassium Supplement to 40 mEq daily.   Disposition: Follow up in 3-4 months  with Vishnu P Mallipeddi, MD or APP.  BMET, proBNP, Mag.  Mallipeddi. 3-4 months. F/u.    Signed, Sharlene Dory, NP 02/17/2023, 12:54 PM [facility]

## 2023-03-18 ENCOUNTER — Encounter: Payer: Self-pay | Admitting: Adult Health Nurse Practitioner

## 2023-03-20 ENCOUNTER — Other Ambulatory Visit: Payer: Self-pay | Admitting: Nurse Practitioner

## 2023-03-20 MED ORDER — TORSEMIDE 20 MG PO TABS: 40.0000 mg | ORAL_TABLET | Freq: Two times a day (BID) | ORAL | Status: AC

## 2023-06-10 ENCOUNTER — Ambulatory Visit: Payer: Medicare Other | Admitting: Internal Medicine

## 2023-08-13 ENCOUNTER — Ambulatory Visit: Attending: Internal Medicine | Admitting: Internal Medicine

## 2023-08-13 ENCOUNTER — Encounter: Payer: Self-pay | Admitting: Internal Medicine

## 2023-08-13 VITALS — BP 110/82 | HR 68 | Ht 65.0 in | Wt 200.8 lb

## 2023-08-13 DIAGNOSIS — I5032 Chronic diastolic (congestive) heart failure: Secondary | ICD-10-CM

## 2023-08-13 NOTE — Progress Notes (Signed)
 Cardiology Office Note  Date: 08/13/2023   ID: [name], DOB January 24, 1956, MRN [id number]  PCP:  Darcel Early, MD  Cardiologist:  Lasalle Pointer, MD Electrophysiologist:  None   Reason for Office Visit: Evaluation of coronary calcifications and pericardial thickening   History of Present Illness: [name] is a 68 y.o. female known to have persistent A-fib, DM 2, HLD is here for follow-up visit.  Per review of EKGs in the EMR, patient has been in atrial fibrillation since 2021.  On metoprolol  tartrate 25 mg twice daily and Eliquis  twice daily.  Asymptomatic, does not have DOE or angina.  Gets up and does her daily and not much active though.  No dizziness, syncope, palpitations.  She has some leg swelling for which her legs are wrapped.  But no pitting edema.  Due to CT imaging evidence of pericardial thickening, cardiac MRI was requested to rule out constrictive pericarditis but patient refused.  Past Medical History:  Diagnosis Date   Abdominal hernia    Allergy    Anemia    Anginal pain (HCC)    CHF (congestive heart failure) (HCC)    Colonic polyp    DDD (degenerative disc disease), cervical ddd   Depression    Diabetes mellitus without complication (HCC)    Fibromyalgia    Gout    Hyperlipidemia    Hypertension    IBS (irritable bowel syndrome)    Liver cirrhosis (HCC)    Polyneuropathy     Past Surgical History:  Procedure Laterality Date   CERVICAL DISC SURGERY     CESAREAN SECTION  [id number]   CHOLECYSTECTOMY  2015   COLONOSCOPY W/ BIOPSIES AND POLYPECTOMY  06/10/2013   ESOPHAGOGASTRODUODENOSCOPY     ESOPHAGOGASTRODUODENOSCOPY (EGD) WITH PROPOFOL  N/A 06/27/2017   Procedure: ESOPHAGOGASTRODUODENOSCOPY (EGD) WITH PROPOFOL ;  Surgeon: Deveron Fly, MD;  Location: [facility] ENDOSCOPY;  Service: Endoscopy;  Laterality: N/A;   HERNIA REPAIR  2015   KNEE CARTILAGE SURGERY Right    partial amputation     toe surgery   SPINE SURGERY     TOE  AMPUTATION Right 2015   partial amputation 3rd toe   TONSILLECTOMY  1987   UMBILICAL HERNIA REPAIR      Current Outpatient Medications  Medication Sig Dispense Refill   acetaminophen  (TYLENOL ) 500 MG tablet Take 1,000 mg by mouth every 8 (eight) hours as needed.     allopurinol (ZYLOPRIM) 100 MG tablet Take 100 mg by mouth 2 (two) times daily.     apixaban  (ELIQUIS ) 5 MG TABS tablet Take 1 tablet (5 mg total) by mouth 2 (two) times daily. 180 tablet 3   ascorbic acid (VITAMIN C) 500 MG tablet Take 500 mg by mouth 2 (two) times daily.     busPIRone (BUSPAR) 5 MG tablet Take 5 mg by mouth 2 (two) times daily.     cyanocobalamin (VITAMIN B12) 500 MCG tablet Take 500 mcg by mouth daily.     dapagliflozin  propanediol (FARXIGA ) 10 MG TABS tablet Take 1 tablet (10 mg total) by mouth daily. 30 tablet 6   ferrous sulfate 324 MG TBEC Take 324 mg by mouth in the morning and at bedtime.     folic acid (FOLATE) 400 MCG tablet Take 400 mcg by mouth daily.     gabapentin (NEURONTIN) 400 MG capsule Take 400 mg by mouth 3 (three) times daily.     lactulose  (CHRONULAC ) 10 GM/15ML solution Take 10 mLs by mouth daily.  melatonin 3 MG TABS tablet Take 6 mg by mouth at bedtime.     metoprolol  tartrate (LOPRESSOR ) 25 MG tablet Take 1 tablet (25 mg total) by mouth 2 (two) times daily. 180 tablet 3   mirabegron ER (MYRBETRIQ) 25 MG TB24 tablet Take 25 mg by mouth daily.     polyethylene glycol (MIRALAX / GLYCOLAX) 17 g packet Take 17 g by mouth daily.     potassium chloride  SA (KLOR-CON  M) 20 MEQ tablet Take 2 tablets (40 mEq total) by mouth daily. 60 tablet 6   torsemide  (DEMADEX ) 20 MG tablet Take 2 tablets (40 mg total) by mouth 2 (two) times daily. With 1 extra (20 Mg Tablet) as needed for Shortness of Breath (Patient taking differently: Take 60 mg by mouth daily. With 1 extra (20 Mg Tablet) as needed for Shortness of Breath)     mirtazapine (REMERON) 15 MG tablet Take 15 mg by mouth at bedtime. (Patient not  taking: Reported on 08/13/2023)     No current facility-administered medications for this visit.   Allergies:  Vicodin [hydrocodone-acetaminophen ]   Social History: The patient  reports that she has never smoked. She has never been exposed to tobacco smoke. She has never used smokeless tobacco. She reports that she does not drink alcohol and does not use drugs.   Family History: The patient's family history includes Alcohol abuse in her father and mother; Arthritis in her mother; Colon polyps in her sister; Depression in her father and mother; Diabetes in her mother; Heart disease in her maternal aunt; Hypertension in her mother; Mental illness in her father and mother.   ROS:  Please see the history of present illness. Otherwise, complete review of systems is positive for none.  All other systems are reviewed and negative.   Physical Exam: VS:  BP 110/82 (BP Location: Right Arm, Patient Position: Sitting, Cuff Size: Large)   Pulse 68   Ht 5\' 5"  (1.651 m)   Wt 200 lb 12.8 oz (91.1 kg)   SpO2 96%   BMI 33.41 kg/m , BMI Body mass index is 33.41 kg/m.  Wt Readings from Last 3 Encounters:  08/13/23 200 lb 12.8 oz (91.1 kg)  02/17/23 200 lb (90.7 kg)  10/29/22 204 lb 6.4 oz (92.7 kg)    General: Patient appears comfortable at rest. HEENT: Conjunctiva and lids normal, oropharynx clear with moist mucosa. Neck: Supple, no elevated JVP or carotid bruits, no thyromegaly. Lungs: Clear to auscultation, nonlabored breathing at rest. Cardiac: Regular rate and rhythm, no S3 or significant systolic murmur, no pericardial rub. Abdomen: Soft, nontender, no hepatomegaly, bowel sounds present, no guarding or rebound. Extremities: No pitting edema, distal pulses 2+. Skin: Warm and dry. Musculoskeletal: No kyphosis. Neuropsychiatric: Alert and oriented x3, affect grossly appropriate.  ECG:  An ECG dated 06/28/2022 was personally reviewed today and demonstrated:  Atrial fibrillation, rate  controlled  Recent Labwork: No results found for requested labs within last 365 days.  No results found for: "CHOL", "TRIG", "HDL", "CHOLHDL", "VLDL", "LDLCALC", "LDLDIRECT"  Other Studies Reviewed Today: Echocardiogram from 2017 Normal LVEF Mild TR  Assessment and Plan:  Chronic diastolic heart failure: Asymptomatic, continue p.o. torsemide  40 mg twice daily, Farxiga  10 mg once daily.  Imaging evidence of coronary calcifications: Not on aspirin due to Eliquis  use.  Not on statin, goal LDL will need to be less than 100.  Asymptomatic, will defer ischemia evaluation at this time.  Permanent A-fib: Has been in A-fib since 2021, asymptomatic.  Continue  metoprolol  tartrate 25 mg twice daily and Eliquis  5 mg BD.  Follow-up with pulm for OSA evaluation.  Imaging evidence of pericardial thickening: Patient refused cardiac MRI to rule out constrictive pericarditis.  Medication Adjustments/Labs and Tests Ordered: Current medicines are reviewed at length with the patient today.  Concerns regarding medicines are outlined above.   Tests Ordered: Orders Placed This Encounter  Procedures   EKG 12-Lead    Medication Changes: No orders of the defined types were placed in this encounter.   Disposition:  Follow up 1 year  Signed Werner Labella Priya Mackensie Pilson, MD, 08/13/2023 10:58 AM    [facility] at [facility] [address] [place], [place], [place] [address]

## 2023-08-13 NOTE — Patient Instructions (Signed)
 Medication Instructions:  Your physician recommends that you continue on your current medications as directed. Please refer to the Current Medication list given to you today.  *If you need a refill on your cardiac medications before your next appointment, please call your pharmacy*  Lab Work: NONE   If you have labs (blood work) drawn today and your tests are completely normal, you will receive your results only by: MyChart Message (if you have MyChart) OR A paper copy in the mail If you have any lab test that is abnormal or we need to change your treatment, we will call you to review the results.  Testing/Procedures: NONE   Follow-Up: At [facility], you and your health needs are our priority.  As part of our continuing mission to provide you with exceptional heart care, our providers are all part of one team.  This team includes your primary Cardiologist (physician) and Advanced Practice Providers or APPs (Physician Assistants and Nurse Practitioners) who all work together to provide you with the care you need, when you need it.  Your next appointment:   1 year(s)  Provider:   You may see Vishnu P Mallipeddi, MD or one of the following Advanced Practice Providers on your designated Care Team:   [place], PA-C  [place], [place] Theotis Flake, [place]     We recommend signing up for the patient portal called "MyChart".  Sign up information is provided on this After Visit Summary.  MyChart is used to connect with patients for Virtual Visits (Telemedicine).  Patients are able to view lab/test results, encounter notes, upcoming appointments, etc.  Non-urgent messages can be sent to your provider as well.   To learn more about what you can do with MyChart, go to [email or web address].   Other Instructions Thank you for choosing [facility]!
